# Patient Record
Sex: Male | Born: 1950 | Race: White | Hispanic: No | Marital: Married | Smoking: Former smoker
Health system: Southern US, Community
[De-identification: ages and names within clinical notes are randomized; demographics above are authoritative.]

## PROBLEM LIST (undated history)

## (undated) DIAGNOSIS — I10 Essential (primary) hypertension: Secondary | ICD-10-CM

## (undated) DIAGNOSIS — E079 Disorder of thyroid, unspecified: Secondary | ICD-10-CM

## (undated) DIAGNOSIS — E7800 Pure hypercholesterolemia, unspecified: Secondary | ICD-10-CM

## (undated) DIAGNOSIS — R011 Cardiac murmur, unspecified: Secondary | ICD-10-CM

## (undated) DIAGNOSIS — F1910 Other psychoactive substance abuse, uncomplicated: Secondary | ICD-10-CM

## (undated) DIAGNOSIS — H409 Unspecified glaucoma: Secondary | ICD-10-CM

## (undated) DIAGNOSIS — H269 Unspecified cataract: Secondary | ICD-10-CM

## (undated) HISTORY — PX: APPENDECTOMY: SHX54

## (undated) HISTORY — DX: Unspecified cataract: H26.9

## (undated) HISTORY — PX: TONSILLECTOMY: SUR1361

## (undated) HISTORY — DX: Disorder of thyroid, unspecified: E07.9

## (undated) HISTORY — PX: COLONOSCOPY W/ POLYPECTOMY: SHX1380

## (undated) HISTORY — DX: Other psychoactive substance abuse, uncomplicated: F19.10

## (undated) HISTORY — DX: Unspecified glaucoma: H40.9

## (undated) HISTORY — PX: EYE SURGERY: SHX253

---

## 2011-12-27 ENCOUNTER — Inpatient Hospital Stay (HOSPITAL_COMMUNITY)
Admission: EM | Admit: 2011-12-27 | Discharge: 2012-01-06 | DRG: 750 | Disposition: A | Discharge status: Rehabilitation Facility | Payer: BC Managed Care – PPO | Attending: Internal Medicine | Admitting: Internal Medicine

## 2011-12-27 ENCOUNTER — Encounter (HOSPITAL_COMMUNITY): Payer: Self-pay

## 2011-12-27 ENCOUNTER — Emergency Department (HOSPITAL_COMMUNITY): Payer: BC Managed Care – PPO

## 2011-12-27 DIAGNOSIS — F10931 Alcohol use, unspecified with withdrawal delirium: Principal | ICD-10-CM | POA: Diagnosis present

## 2011-12-27 DIAGNOSIS — I10 Essential (primary) hypertension: Secondary | ICD-10-CM | POA: Diagnosis present

## 2011-12-27 DIAGNOSIS — F10939 Alcohol use, unspecified with withdrawal, unspecified: Secondary | ICD-10-CM | POA: Diagnosis present

## 2011-12-27 DIAGNOSIS — R7989 Other specified abnormal findings of blood chemistry: Secondary | ICD-10-CM | POA: Diagnosis present

## 2011-12-27 DIAGNOSIS — F10239 Alcohol dependence with withdrawal, unspecified: Secondary | ICD-10-CM

## 2011-12-27 DIAGNOSIS — F10231 Alcohol dependence with withdrawal delirium: Secondary | ICD-10-CM | POA: Diagnosis present

## 2011-12-27 DIAGNOSIS — R000 Tachycardia, unspecified: Secondary | ICD-10-CM | POA: Diagnosis present

## 2011-12-27 DIAGNOSIS — E871 Hypo-osmolality and hyponatremia: Secondary | ICD-10-CM | POA: Diagnosis present

## 2011-12-27 DIAGNOSIS — E876 Hypokalemia: Secondary | ICD-10-CM | POA: Diagnosis present

## 2011-12-27 DIAGNOSIS — E872 Acidosis, unspecified: Secondary | ICD-10-CM | POA: Diagnosis present

## 2011-12-27 DIAGNOSIS — D649 Anemia, unspecified: Secondary | ICD-10-CM | POA: Diagnosis present

## 2011-12-27 DIAGNOSIS — K7010 Alcoholic hepatitis without ascites: Secondary | ICD-10-CM | POA: Diagnosis present

## 2011-12-27 DIAGNOSIS — R569 Unspecified convulsions: Secondary | ICD-10-CM | POA: Diagnosis present

## 2011-12-27 DIAGNOSIS — F1020 Alcohol dependence, uncomplicated: Secondary | ICD-10-CM | POA: Diagnosis present

## 2011-12-27 DIAGNOSIS — F411 Generalized anxiety disorder: Secondary | ICD-10-CM | POA: Diagnosis present

## 2011-12-27 DIAGNOSIS — F1010 Alcohol abuse, uncomplicated: Secondary | ICD-10-CM | POA: Diagnosis present

## 2011-12-27 HISTORY — DX: Essential (primary) hypertension: I10

## 2011-12-27 LAB — RAPID URINE DRUG SCREEN, HOSP PERFORMED
Barbiturates: NOT DETECTED
Benzodiazepines: NOT DETECTED

## 2011-12-27 LAB — CBC
HCT: 40.6 % (ref 39.0–52.0)
HCT: 35.9 % — ABNORMAL LOW (ref 39.0–52.0)
MCV: 91.1 fL (ref 78.0–100.0)
Platelets: 168 10*3/uL (ref 150–400)
RBC: 4.32 MIL/uL (ref 4.22–5.81)
RBC: 3.94 MIL/uL — ABNORMAL LOW (ref 4.22–5.81)
RDW: 13.7 % (ref 11.5–15.5)
WBC: 11.5 10*3/uL — ABNORMAL HIGH (ref 4.0–10.5)
WBC: 8.7 10*3/uL (ref 4.0–10.5)

## 2011-12-27 LAB — URINALYSIS, ROUTINE W REFLEX MICROSCOPIC
Leukocytes, UA: NEGATIVE
Nitrite: NEGATIVE
Specific Gravity, Urine: 1.016 (ref 1.005–1.030)
Urobilinogen, UA: 1 mg/dL (ref 0.0–1.0)

## 2011-12-27 LAB — CREATININE, SERUM
GFR calc Af Amer: >90 mL/min (ref >90)
GFR calc non Af Amer: >90 mL/min (ref >90)

## 2011-12-27 LAB — PROTIME-INR: INR: 0.98 (ref 0.00–1.49)

## 2011-12-27 LAB — LACTIC ACID, PLASMA: Lactic Acid, Venous: 2.4 mmol/L — ABNORMAL HIGH (ref 0.5–2.2)

## 2011-12-27 LAB — COMPREHENSIVE METABOLIC PANEL
AST: 180 U/L — ABNORMAL HIGH (ref 0–37)
Albumin: 5 g/dL (ref 3.5–5.2)
Alkaline Phosphatase: 112 U/L (ref 39–117)
BUN: 6 mg/dL (ref 6–23)
CO2: 13 mEq/L — ABNORMAL LOW (ref 19–32)
Chloride: 88 mEq/L — ABNORMAL LOW (ref 96–112)
Potassium: 3.2 mEq/L — ABNORMAL LOW (ref 3.5–5.1)
Total Bilirubin: 1.3 mg/dL — ABNORMAL HIGH (ref 0.3–1.2)

## 2011-12-27 LAB — AMMONIA: Ammonia: 45 umol/L (ref 11–60)

## 2011-12-27 LAB — APTT: aPTT: 30 seconds (ref 24–37)

## 2011-12-27 LAB — ETHANOL: Alcohol, Ethyl (B): <11 mg/dL (ref 0–11)

## 2011-12-27 LAB — POCT I-STAT TROPONIN I: Troponin i, poc: 0 ng/mL (ref 0.00–0.08)

## 2011-12-27 MED ORDER — FOLIC ACID 1 MG PO TABS
1.0000 mg | ORAL_TABLET | Freq: Once | ORAL | Status: AC
  Administered 2011-12-27: 1 mg via ORAL
  Filled 2011-12-27: qty 1

## 2011-12-27 MED ORDER — LORAZEPAM 1 MG PO TABS
1.0000 mg | ORAL_TABLET | Freq: Four times a day (QID) | ORAL | Status: DC | PRN
  Administered 2011-12-27 – 2011-12-28 (×2): 1 mg via ORAL
  Filled 2011-12-27 (×2): qty 1

## 2011-12-27 MED ORDER — THIAMINE HCL 100 MG/ML IJ SOLN
100.0000 mg | Freq: Every day | INTRAMUSCULAR | Status: DC
  Filled 2011-12-27 (×5): qty 1

## 2011-12-27 MED ORDER — SODIUM CHLORIDE 0.9 % IJ SOLN
3.0000 mL | Freq: Two times a day (BID) | INTRAMUSCULAR | Status: DC
  Administered 2011-12-27 – 2012-01-05 (×11): 3 mL via INTRAVENOUS

## 2011-12-27 MED ORDER — LORAZEPAM 2 MG/ML IJ SOLN
1.0000 mg | Freq: Four times a day (QID) | INTRAMUSCULAR | Status: DC | PRN
  Administered 2011-12-29: 1 mg via INTRAVENOUS
  Filled 2011-12-27 (×3): qty 1

## 2011-12-27 MED ORDER — MAGNESIUM SULFATE 40 MG/ML IJ SOLN
2.0000 g | Freq: Once | INTRAMUSCULAR | Status: AC
  Administered 2011-12-27: 2 g via INTRAVENOUS
  Filled 2011-12-27: qty 50

## 2011-12-27 MED ORDER — ENOXAPARIN SODIUM 40 MG/0.4ML ~~LOC~~ SOLN
40.0000 mg | SUBCUTANEOUS | Status: DC
  Administered 2011-12-27 – 2012-01-03 (×8): 40 mg via SUBCUTANEOUS
  Filled 2011-12-27 (×9): qty 0.4

## 2011-12-27 MED ORDER — FOLIC ACID 1 MG PO TABS
1.0000 mg | ORAL_TABLET | Freq: Every day | ORAL | Status: DC
  Administered 2011-12-28 – 2012-01-06 (×10): 1 mg via ORAL
  Filled 2011-12-27 (×10): qty 1

## 2011-12-27 MED ORDER — LORAZEPAM 2 MG/ML IJ SOLN
1.0000 mg | Freq: Once | INTRAMUSCULAR | Status: AC
  Administered 2011-12-27: 1 mg via INTRAVENOUS
  Filled 2011-12-27: qty 1

## 2011-12-27 MED ORDER — ADULT MULTIVITAMIN W/MINERALS CH
1.0000 | ORAL_TABLET | Freq: Every day | ORAL | Status: DC
  Administered 2011-12-27 – 2012-01-06 (×11): 1 via ORAL
  Filled 2011-12-27 (×11): qty 1

## 2011-12-27 MED ORDER — SODIUM CHLORIDE 0.9 % IV BOLUS (SEPSIS)
1000.0000 mL | Freq: Once | INTRAVENOUS | Status: AC
  Administered 2011-12-27: 1000 mL via INTRAVENOUS

## 2011-12-27 MED ORDER — ACETAMINOPHEN 650 MG RE SUPP: 650.0000 mg | Freq: Four times a day (QID) | RECTAL | Status: DC | PRN

## 2011-12-27 MED ORDER — SODIUM CHLORIDE 0.9 % IV SOLN
Freq: Once | INTRAVENOUS | Status: AC
  Administered 2011-12-27: 18:00:00 via INTRAVENOUS

## 2011-12-27 MED ORDER — VITAMIN B-1 100 MG PO TABS
100.0000 mg | ORAL_TABLET | Freq: Every day | ORAL | Status: DC
  Administered 2011-12-28 – 2012-01-02 (×6): 100 mg via ORAL
  Filled 2011-12-27 (×6): qty 1

## 2011-12-27 MED ORDER — LABETALOL HCL 5 MG/ML IV SOLN
10.0000 mg | Freq: Once | INTRAVENOUS | Status: AC
  Administered 2011-12-27: 10 mg via INTRAVENOUS
  Filled 2011-12-27: qty 4

## 2011-12-27 MED ORDER — VITAMIN B-1 100 MG PO TABS
100.0000 mg | ORAL_TABLET | Freq: Once | ORAL | Status: AC
  Administered 2011-12-27: 100 mg via ORAL
  Filled 2011-12-27: qty 1

## 2011-12-27 MED ORDER — LORAZEPAM 2 MG/ML IJ SOLN
1.0000 mg | Freq: Once | INTRAMUSCULAR | Status: AC
  Administered 2011-12-27: 1 mg via INTRAVENOUS

## 2011-12-27 MED ORDER — THIAMINE HCL 100 MG/ML IJ SOLN
Freq: Once | INTRAVENOUS | Status: AC
  Administered 2011-12-27: 21:00:00 via INTRAVENOUS
  Filled 2011-12-27 (×2): qty 1000

## 2011-12-27 MED ORDER — LORAZEPAM 2 MG/ML IJ SOLN
INTRAMUSCULAR | Status: AC
  Filled 2011-12-27: qty 1

## 2011-12-27 MED ORDER — POTASSIUM CHLORIDE CRYS ER 20 MEQ PO TBCR
40.0000 meq | EXTENDED_RELEASE_TABLET | Freq: Once | ORAL | Status: AC
  Administered 2011-12-27: 40 meq via ORAL
  Filled 2011-12-27: qty 2

## 2011-12-27 MED ORDER — ONDANSETRON HCL 4 MG/2ML IJ SOLN: 4.0000 mg | Freq: Four times a day (QID) | INTRAMUSCULAR | Status: DC | PRN

## 2011-12-27 MED ORDER — ONDANSETRON HCL 4 MG PO TABS: 4.0000 mg | ORAL_TABLET | Freq: Four times a day (QID) | ORAL | Status: DC | PRN

## 2011-12-27 MED ORDER — ACETAMINOPHEN 325 MG PO TABS: 650.0000 mg | ORAL_TABLET | Freq: Four times a day (QID) | ORAL | Status: DC | PRN

## 2011-12-27 NOTE — ED Notes (Signed)
Report given to [place], rn.  Pt transported via stretcher to floor.

## 2011-12-27 NOTE — ED Notes (Signed)
Pt back from CT

## 2011-12-27 NOTE — ED Notes (Signed)
Per [facility], today at 1400 pt started having an episode of slurred speech, not making any sense per wife, and was shaking all over. Had been working on a cable box prior to episode which lasted 1 minute. Pt is now A/O x 4. 20g to LH, 230/130 pressure initially by EMS, then 160/100, CBG 109. ST on the monitor.

## 2011-12-27 NOTE — Progress Notes (Signed)
Antonio Acosta [id number] Admitted to 5508: 12/27/2011 08:30PM Attending Provider: Zannie Cove, MD    Antonio Acosta is a 61 y.o. male patient admitted from ED awake, alert  & orientated  X 3,  full code, VSS - Blood pressure 170/95, pulse 100, temperature 99.1 F (37.3 C), temperature source Oral, resp. rate 20, height 6' (1.829 m), weight 72.1 kg (158 lb 15.2 oz), SpO2 99.00%. RA, no c/o shortness of breath, no c/o chest pain, no distress noted. Tele #[id number] placed and pt is currently running:normal sinus rhythm.   IV site WDL:  forearm right, condition patent and no redness with a transparent dsg that's clean dry and intact.  Allergies:  No Known Allergies   Past Medical History  Diagnosis Date  . Hypertension     History:  obtained from the patient.  Pt orientation to unit, room and routine. Information packet given to patient/family and safety video watched.  Admission INP armband ID verified with patient/family, and in place. SR up x 2, fall risk assessment complete with Patient and family verbalizing understanding of risks associated with falls. Pt verbalizes an understanding of how to use the call bell and to call for help before getting out of bed.  Skin, clean-dry- intact without evidence of bruising, or skin tears.   No evidence of skin break down noted on exam.    Will cont to monitor and assist as needed.  Julien Nordmann [facility], RN 12/27/2011 11:01 PM

## 2011-12-27 NOTE — ED Notes (Signed)
Patient transported to CT 

## 2011-12-27 NOTE — H&P (Addendum)
[facility]          History and Physical    PCP:  No primary provider on file.   Chief Complaint:  Shakes, tremors, slurred speech  HPI: Mr. Antonio Acosta is a 61 year old white male, he has not seen a medical doctor or 5-6 years, his wife relates patient has been under a lot of stress for over 6 months now, related to the demise of his mother and job loss , he apparently has been drinking very heavily for the last 6 months, patient thinks it's more like 9 months . He admits drinking about 5-10 beers a day , yesterday he drank only one or 2 beers during the day , and this morning he went out with his wife who works with a gardening club and they were outside raking leaves and doing other work in the garden, she drove them back home and this afternoon around 1pm he seemed a little more anxious and started saying things that did not make sense, was tremulous and his wife noted that her speech was slightly slurred as well. No history of fevers or chills, no weakness or numbness in any arm or leg, no recent trauma, no new medications. Upon evaluation the emergency room he was noted to be tachycardic, hypertensive per EMS blood pressure of 230/130 initially, tremulous, and at one point in the emergency room room had an episode of seizure-like jerking movements but never quite lost consciousness per family, this was transient and resolved.   Allergies:  No Known Allergies    Past Medical History  Diagnosis Date  . Hypertension     No past surgical history on file.  Prior to Admission medications   Medication Sig Start Date End Date Taking? Authorizing Provider  Tetrahydrozoline HCl (VISINE OP) Place 1 drop into both eyes daily as needed. For dry eyes   Yes Historical Provider, MD    Social History:  does not have a smoking history on file. He does not have any smokeless tobacco history on file. His alcohol and drug histories not on file.  family history: this was reviewed  with patient, no pertinent family history noted, no h/o alcoholism or other liver diseases  Review of Systems:  positives bolded Constitutional: Denies fever, chills, diaphoresis, appetite change and fatigue.  HEENT: Denies photophobia, eye pain, redness, hearing loss, ear pain, congestion, sore throat, rhinorrhea, sneezing, mouth sores, trouble swallowing, neck pain, neck stiffness and tinnitus.   Respiratory: Denies SOB, DOE, cough, chest tightness,  and wheezing.   Cardiovascular: Denies chest pain, palpitations and leg swelling.  Gastrointestinal: Denies nausea, vomiting, abdominal pain, diarrhea, constipation, blood in stool and abdominal distention.  Genitourinary: Denies dysuria, urgency, frequency, hematuria, flank pain and difficulty urinating.  Musculoskeletal: Denies myalgias, back pain, joint swelling, arthralgias and gait problem.  Skin: Denies pallor, rash and wound.  Neurological: Denies dizziness, seizures, syncope, weakness, light-headedness, numbness and headaches.  Hematological: Denies adenopathy. Easy bruising, personal or family bleeding history  Psychiatric/Behavioral: Denies suicidal ideation, mood changes, confusion, nervousness, sleep disturbance and agitation   Physical Exam: Blood pressure 168/98, pulse 133, temperature 98.2 F (36.8 C), temperature source Oral, resp. rate 29, SpO2 97.00%.  Gen.: Alert awake, extremely anxious and flushed appearance HEENT pupils equal, small reactive to light, oral mucosa moist and pink CVS S1-S2 regular rate rhythm no murmurs rubs or gallops Lungs clear auscultation bilaterally Abdomen soft nontender with normal bowel sounds no organomegaly Extremities no edema clubbing or cyanosis Neuro: Tremors noted in both upper  extremities , cranial nerves 2-12 intact   motor 5 x 5 in all extremities proximal and distal muscle groups Sensations light touch intact Deep tendon reflexes 2+ bilaterally Plantars withdrawal Coordination: On  finger-nose test noted to have extreme jerkiness on both sides   gait not assessed  Labs on Admission:  Results for orders placed during the hospital encounter of 12/27/11 (from the past 48 hour(s))  CBC     Status: Abnormal   Collection Time   12/27/11  3:16 PM      Component Value Range Comment   WBC 11.5 (*) 4.0 - 10.5 K/uL    RBC 4.32  4.22 - 5.81 MIL/uL    Hemoglobin 13.4  13.0 - 17.0 g/dL    HCT 95.6  21.3 - 08.6 %    MCV 94.0  78.0 - 100.0 fL    MCH 31.0  26.0 - 34.0 pg    MCHC 33.0  30.0 - 36.0 g/dL    RDW 57.8  46.9 - 62.9 %    Platelets 199  150 - 400 K/uL   COMPREHENSIVE METABOLIC PANEL     Status: Abnormal   Collection Time   12/27/11  3:16 PM      Component Value Range Comment   Sodium 137  135 - 145 mEq/L    Potassium 3.2 (*) 3.5 - 5.1 mEq/L    Chloride 88 (*) 96 - 112 mEq/L    CO2 13 (*) 19 - 32 mEq/L    Glucose, Bld 156 (*) 70 - 99 mg/dL    BUN 6  6 - 23 mg/dL    Creatinine, Ser 5.28  0.50 - 1.35 mg/dL    Calcium 41.3  8.4 - 10.5 mg/dL    Total Protein 9.3 (*) 6.0 - 8.3 g/dL    Albumin 5.0  3.5 - 5.2 g/dL    AST 244 (*) 0 - 37 U/L    ALT 129 (*) 0 - 53 U/L    Alkaline Phosphatase 112  39 - 117 U/L    Total Bilirubin 1.3 (*) 0.3 - 1.2 mg/dL    GFR calc non Af Amer >90  >90 mL/min    GFR calc Af Amer >90  >90 mL/min   ETHANOL     Status: Normal   Collection Time   12/27/11  3:16 PM      Component Value Range Comment   Alcohol, Ethyl (B) <11  0 - 11 mg/dL   PROTIME-INR     Status: Normal   Collection Time   12/27/11  3:16 PM      Component Value Range Comment   Prothrombin Time 12.9  11.6 - 15.2 seconds    INR 0.98  0.00 - 1.49   APTT     Status: Normal   Collection Time   12/27/11  3:16 PM      Component Value Range Comment   aPTT 30  24 - 37 seconds   POCT I-STAT TROPONIN I     Status: Normal   Collection Time   12/27/11  3:51 PM      Component Value Range Comment   Troponin i, poc 0.00  0.00 - 0.08 ng/mL    Comment 3            URINE RAPID  DRUG SCREEN (HOSP PERFORMED)     Status: Normal   Collection Time   12/27/11  4:33 PM      Component Value Range Comment   Opiates NONE DETECTED  NONE DETECTED    Cocaine NONE DETECTED  NONE DETECTED    Benzodiazepines NONE DETECTED  NONE DETECTED    Amphetamines NONE DETECTED  NONE DETECTED    Tetrahydrocannabinol NONE DETECTED  NONE DETECTED    Barbiturates NONE DETECTED  NONE DETECTED   URINALYSIS, ROUTINE W REFLEX MICROSCOPIC     Status: Abnormal   Collection Time   12/27/11  4:33 PM      Component Value Range Comment   Color, Urine YELLOW  YELLOW    APPearance CLEAR  CLEAR    Specific Gravity, Urine 1.016  1.005 - 1.030    pH 7.0  5.0 - 8.0    Glucose, UA 100 (*) NEGATIVE mg/dL    Hgb urine dipstick TRACE (*) NEGATIVE    Bilirubin Urine NEGATIVE  NEGATIVE    Ketones, ur 15 (*) NEGATIVE mg/dL    Protein, ur 30 (*) NEGATIVE mg/dL    Urobilinogen, UA 1.0  0.0 - 1.0 mg/dL    Nitrite NEGATIVE  NEGATIVE    Leukocytes, UA NEGATIVE  NEGATIVE   URINE MICROSCOPIC-ADD ON     Status: Normal   Collection Time   12/27/11  4:33 PM      Component Value Range Comment   Squamous Epithelial / LPF RARE  RARE    WBC, UA 0-2  <3 WBC/hpf    RBC / HPF 0-2  <3 RBC/hpf   MAGNESIUM     Status: Abnormal   Collection Time   12/27/11  4:42 PM      Component Value Range Comment   Magnesium 1.4 (*) 1.5 - 2.5 mg/dL     Radiological Exams on Admission: Ct Head Wo Contrast  12/27/2011  *RADIOLOGY REPORT*  Clinical Data: Transiting episode of slurred speech and tremors.  CT HEAD WITHOUT CONTRAST  Technique:  Contiguous axial images were obtained from the base of the skull through the vertex without contrast.  Comparison: None.  Findings: No acute cortical infarct, hemorrhage, or mass lesion is present.  Mild periventricular and subcortical white matter hypoattenuation is noted.  Mild generalized atrophy is present as well.  The ventricles are of normal size.  No significant extra- axial fluid collection  is present.  There is partial opacification of the anterior left ethmoid air cells.  The paranasal sinuses and mastoid air cells are otherwise clear.  The osseous skull is intact.  IMPRESSION:  1.  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 The finding is nonspecific but can be seen in the setting of chronic microvascular ischemia, a demyelinating process such as multiple sclerosis, vasculitis, complicated migraine headaches, or as the sequelae of a prior infectious or inflammatory process. 2.  Minimal ethmoid sinus disease.   Original Report Authenticated By: Marin Roberts, M.D.     Assessment/Plan  1. Alcohol withdrawal: Physical and autonomic manifestations of alcohol withdrawal, in a heavy alcoholic Alcohol level less than 11 Will  treat with supportive care, IV fluids, Ativan PRN per CIWA  protocol Thiamine, folate, multivitamins Check ammonia level  2. Hypokalemia will replace  3 Hypomagnesemia: We'll replace  4. 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

## 2011-12-27 NOTE — ED Provider Notes (Signed)
History     CSN: [id number]  Arrival date & time 12/27/11  1504   None     Chief Complaint  Patient presents with  . Tremors   HPI chief complaint garbled speech. Patient arrived by EMS. History provided by patient family and EMS. No language barriers identified. Information not limited. Onset of symptoms several hours ago. Location at home. Symptoms resolved spontaneously not worsened by anything. Duration 1 minute. Context the patient had missed all food and drink today. He also been working outside. For associated signs and symptoms please refer to the review of systems. No treatments tried prior to arrival. No recent medical care. Regarding patient's social history please refer to the nurse's notes. Patient initially denied alcohol consumption for many. I have reviewed patient's past medical surgical social history as well as medications and allergies. Positive family history for TIA in the patient's mother.  Past Medical History  Diagnosis Date  . Hypertension     No past surgical history on file.  No family history on file.  History  Substance Use Topics  . Smoking status: Not on file  . Smokeless tobacco: Not on file  . Alcohol Use:       Review of Systems  Constitutional: Negative for fever and chills.  HENT: Negative for hearing loss, ear pain, congestion, sore throat, facial swelling, rhinorrhea, drooling, mouth sores, trouble swallowing, neck pain, neck stiffness, voice change, sinus pressure and ear discharge.   Eyes: Negative for visual disturbance.  Respiratory: Negative for cough, chest tightness and shortness of breath.   Cardiovascular: Negative for chest pain, palpitations and leg swelling.  Gastrointestinal: Negative for nausea, vomiting, abdominal pain, diarrhea, constipation, blood in stool and abdominal distention.  Genitourinary: Negative for dysuria, urgency, hematuria and difficulty urinating.  Musculoskeletal: Negative for back pain and gait problem.   Skin: Negative for rash.  Neurological: Positive for tremors and speech difficulty. Negative for dizziness, seizures, syncope, facial asymmetry, weakness, light-headedness, numbness and headaches.  Hematological: Negative for adenopathy. Does not bruise/bleed easily.  Psychiatric/Behavioral: Negative for confusion.    Allergies  Review of patient's allergies indicates no known allergies.  Home Medications  No current outpatient prescriptions on file.  SpO2 98%  Physical Exam  Constitutional: He is oriented to person, place, and time. He appears well-developed and well-nourished. No distress.  HENT:  Head: Normocephalic.  Eyes: Conjunctivae normal are normal.  Neck: Normal range of motion. Neck supple.  Cardiovascular: Normal rate, regular rhythm, normal heart sounds and intact distal pulses.   No murmur heard. Pulmonary/Chest: Effort normal and breath sounds normal. No respiratory distress.  Abdominal: Soft. Bowel sounds are normal. He exhibits no distension. There is no tenderness.  Musculoskeletal: Normal range of motion. He exhibits no edema and no tenderness.  Neurological: He is alert and oriented to person, place, and time. He has normal strength. He displays tremor. No cranial nerve deficit or sensory deficit. Coordination normal. GCS eye subscore is 4. GCS verbal subscore is 5. GCS motor subscore is 6.  Skin: Skin is warm and dry. He is not diaphoretic.  Psychiatric: He has a normal mood and affect.    ED Course  Procedures (including critical care time)  Labs Reviewed  CBC - Abnormal; Notable for the following:    WBC 11.5 (*)     All other components within normal limits  COMPREHENSIVE METABOLIC PANEL - Abnormal; Notable for the following:    Potassium 3.2 (*)     Chloride 88 (*)  CO2 13 (*)     Glucose, Bld 156 (*)     Total Protein 9.3 (*)     AST 180 (*)     ALT 129 (*)     Total Bilirubin 1.3 (*)     All other components within normal limits   URINALYSIS, ROUTINE W REFLEX MICROSCOPIC - Abnormal; Notable for the following:    Glucose, UA 100 (*)     Hgb urine dipstick TRACE (*)     Ketones, ur 15 (*)     Protein, ur 30 (*)     All other components within normal limits  MAGNESIUM - Abnormal; Notable for the following:    Magnesium 1.4 (*)     All other components within normal limits  URINE RAPID DRUG SCREEN (HOSP PERFORMED)  ETHANOL  PROTIME-INR  APTT  POCT I-STAT TROPONIN I  URINE MICROSCOPIC-ADD ON  HEMOGLOBIN A1C  AMMONIA  SALICYLATE LEVEL  LACTIC ACID, PLASMA   Ct Head Wo Contrast  12/27/2011  *RADIOLOGY REPORT*  Clinical Data: Transiting episode of slurred speech and tremors.  CT HEAD WITHOUT CONTRAST  Technique:  Contiguous axial images were obtained from the base of the skull through the vertex without contrast.  Comparison: None.  Findings: No acute cortical infarct, hemorrhage, or mass lesion is present.  Mild periventricular and subcortical white matter hypoattenuation is noted.  Mild generalized atrophy is present as well.  The ventricles are of normal size.  No significant extra- axial fluid collection is present.  There is partial opacification of the anterior left ethmoid air cells.  The paranasal sinuses and mastoid air cells are otherwise clear.  The osseous skull is intact.  IMPRESSION:  1.  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 The finding is nonspecific but can be seen in the setting of chronic microvascular ischemia, a demyelinating process such as multiple sclerosis, vasculitis, complicated migraine headaches, or as the sequelae of a prior infectious or inflammatory process. 2.  Minimal ethmoid sinus disease.   Original Report Authenticated By: Marin Roberts, M.D.      1. Alcohol withdrawal   2. ETOH abuse   3. Hypokalemia   4. LFT elevation   5. Metabolic acidosis       MDM  Patient is a tremulous and anxious appearing but otherwise well-appearing nail with no prior  stroke history presents to ED after 1 minute episode of garbled speech per family member. No reported facial droop paresthesias or weakness. No recent trauma. No concern for meningitis or encephalitis on exam. Patient appears very anxious and had a two-minute episode of generalized tonoclonic activity all the while the patient was conscious and alert and following commands during this episode. Likely pseudoseizure. Head CT unremarkable. Labs unremarkable. Patient is not hypoglycemic. Vital stable afebrile. No focal deficits on exam. Although family and patient denies history of alcohol use to me the family did endorse to the admitting hospitalist the patient drinks almost a 12 pack daily but has not had any alcohol today. Tremors tachycardia mild hypertension likely secondary to withdrawal. More Ativan provided. Patient comfortable at this time. CIWA protocol in place. Admitted to hospital service.        Consuello Masse, MD 12/28/11 [phone]

## 2011-12-27 NOTE — ED Notes (Signed)
hospitalist at the bedside 

## 2011-12-28 LAB — COMPREHENSIVE METABOLIC PANEL
ALT: 95 U/L — ABNORMAL HIGH (ref 0–53)
AST: 128 U/L — ABNORMAL HIGH (ref 0–37)
Albumin: 4 g/dL (ref 3.5–5.2)
Alkaline Phosphatase: 92 U/L (ref 39–117)
Calcium: 9.2 mg/dL (ref 8.4–10.5)
GFR calc Af Amer: >90 mL/min (ref >90)
Glucose, Bld: 112 mg/dL — ABNORMAL HIGH (ref 70–99)
Potassium: 3.5 mEq/L (ref 3.5–5.1)
Sodium: 132 mEq/L — ABNORMAL LOW (ref 135–145)
Total Protein: 8 g/dL (ref 6.0–8.3)

## 2011-12-28 LAB — CBC
MCH: 30.7 pg (ref 26.0–34.0)
MCHC: 33.4 g/dL (ref 30.0–36.0)
Platelets: 154 10*3/uL (ref 150–400)
RDW: 13.7 % (ref 11.5–15.5)

## 2011-12-28 MED ORDER — SODIUM CHLORIDE 0.9 % IV SOLN
INTRAVENOUS | Status: DC
  Administered 2011-12-28: 23:00:00 via INTRAVENOUS
  Administered 2011-12-28: 1000 mL via INTRAVENOUS

## 2011-12-28 MED ORDER — LORAZEPAM 2 MG/ML IJ SOLN
1.0000 mg | Freq: Once | INTRAMUSCULAR | Status: AC
  Administered 2011-12-29: 1 mg via INTRAVENOUS
  Filled 2011-12-28 (×2): qty 1

## 2011-12-28 MED ORDER — ZOLPIDEM TARTRATE 5 MG PO TABS
5.0000 mg | ORAL_TABLET | Freq: Every evening | ORAL | Status: DC | PRN
  Administered 2011-12-28: 5 mg via ORAL
  Filled 2011-12-28: qty 1

## 2011-12-28 NOTE — Progress Notes (Signed)
Pt bp 169/107 HR 108 MD notified.

## 2011-12-29 DIAGNOSIS — F10931 Alcohol use, unspecified with withdrawal delirium: Principal | ICD-10-CM

## 2011-12-29 DIAGNOSIS — E876 Hypokalemia: Secondary | ICD-10-CM

## 2011-12-29 DIAGNOSIS — F10939 Alcohol use, unspecified with withdrawal, unspecified: Secondary | ICD-10-CM

## 2011-12-29 DIAGNOSIS — F10239 Alcohol dependence with withdrawal, unspecified: Secondary | ICD-10-CM

## 2011-12-29 DIAGNOSIS — F10231 Alcohol dependence with withdrawal delirium: Principal | ICD-10-CM

## 2011-12-29 LAB — COMPREHENSIVE METABOLIC PANEL
ALT: 91 U/L — ABNORMAL HIGH (ref 0–53)
Alkaline Phosphatase: 90 U/L (ref 39–117)
Chloride: 95 mEq/L — ABNORMAL LOW (ref 96–112)
GFR calc Af Amer: >90 mL/min (ref >90)
Glucose, Bld: 94 mg/dL (ref 70–99)
Potassium: 3.3 mEq/L — ABNORMAL LOW (ref 3.5–5.1)
Sodium: 134 mEq/L — ABNORMAL LOW (ref 135–145)
Total Bilirubin: 2.4 mg/dL — ABNORMAL HIGH (ref 0.3–1.2)
Total Protein: 8 g/dL (ref 6.0–8.3)

## 2011-12-29 LAB — BASIC METABOLIC PANEL
BUN: 11 mg/dL (ref 6–23)
Calcium: 10.1 mg/dL (ref 8.4–10.5)
Chloride: 95 mEq/L — ABNORMAL LOW (ref 96–112)
Creatinine, Ser: 0.62 mg/dL (ref 0.50–1.35)
GFR calc Af Amer: >90 mL/min (ref >90)
GFR calc non Af Amer: >90 mL/min (ref >90)

## 2011-12-29 LAB — MAGNESIUM: Magnesium: 1.8 mg/dL (ref 1.5–2.5)

## 2011-12-29 LAB — GLUCOSE, CAPILLARY: Glucose-Capillary: 101 mg/dL — ABNORMAL HIGH (ref 70–99)

## 2011-12-29 MED ORDER — LORAZEPAM 2 MG/ML IJ SOLN
2.0000 mg | Freq: Once | INTRAMUSCULAR | Status: AC
  Administered 2011-12-29: 2 mg via INTRAVENOUS

## 2011-12-29 MED ORDER — LORAZEPAM 2 MG/ML IJ SOLN
2.0000 mg | INTRAMUSCULAR | Status: DC | PRN
  Administered 2011-12-29: 2 mg via INTRAVENOUS
  Filled 2011-12-29 (×2): qty 1

## 2011-12-29 MED ORDER — LORAZEPAM 1 MG PO TABS: 2.0000 mg | ORAL_TABLET | ORAL | Status: DC | PRN

## 2011-12-29 MED ORDER — LORAZEPAM 2 MG/ML IJ SOLN
1.0000 mg | Freq: Once | INTRAMUSCULAR | Status: AC
  Administered 2011-12-29: 1 mg via INTRAVENOUS

## 2011-12-29 MED ORDER — LORAZEPAM 2 MG/ML IJ SOLN
2.0000 mg | INTRAMUSCULAR | Status: DC | PRN
  Administered 2011-12-29: 2 mg via INTRAVENOUS
  Filled 2011-12-29: qty 1

## 2011-12-29 MED ORDER — LORAZEPAM 2 MG/ML IJ SOLN
INTRAMUSCULAR | Status: AC
  Filled 2011-12-29: qty 1

## 2011-12-29 MED ORDER — LORAZEPAM 2 MG/ML IJ SOLN
1.0000 mg | INTRAMUSCULAR | Status: DC | PRN
  Administered 2011-12-29: 2 mg via INTRAVENOUS

## 2011-12-29 MED ORDER — KCL IN DEXTROSE-NACL 20-5-0.9 MEQ/L-%-% IV SOLN
INTRAVENOUS | Status: DC
  Administered 2011-12-29 – 2011-12-30 (×2): via INTRAVENOUS
  Filled 2011-12-29 (×3): qty 1000

## 2011-12-29 MED ORDER — BIOTENE DRY MOUTH MT LIQD
15.0000 mL | Freq: Two times a day (BID) | OROMUCOSAL | Status: DC
  Administered 2011-12-29 – 2011-12-31 (×5): 15 mL via OROMUCOSAL

## 2011-12-29 MED ORDER — LORAZEPAM 2 MG/ML IJ SOLN
2.0000 mg | INTRAMUSCULAR | Status: DC | PRN
  Administered 2011-12-29 (×2): 2 mg via INTRAVENOUS
  Filled 2011-12-29 (×4): qty 1

## 2011-12-29 MED ORDER — SODIUM CHLORIDE 0.9 % IJ SOLN
INTRAMUSCULAR | Status: AC
  Filled 2011-12-29: qty 10

## 2011-12-29 MED ORDER — DEXMEDETOMIDINE HCL IN NACL 200 MCG/50ML IV SOLN
0.2000 ug/kg/h | INTRAVENOUS | Status: DC
  Administered 2011-12-29: 0.6 ug/kg/h via INTRAVENOUS
  Administered 2011-12-29: 0.2 ug/kg/h via INTRAVENOUS
  Administered 2011-12-30: 0.7 ug/kg/h via INTRAVENOUS
  Administered 2011-12-30: 0.6 ug/kg/h via INTRAVENOUS
  Administered 2011-12-30 – 2011-12-31 (×3): 0.4 ug/kg/h via INTRAVENOUS
  Filled 2011-12-29 (×5): qty 50
  Filled 2011-12-29: qty 100

## 2011-12-29 MED ORDER — PROPRANOLOL HCL 20 MG PO TABS
20.0000 mg | ORAL_TABLET | Freq: Two times a day (BID) | ORAL | Status: DC
  Administered 2011-12-29 (×2): 20 mg via ORAL
  Filled 2011-12-29 (×4): qty 1

## 2011-12-29 NOTE — Progress Notes (Signed)
MD at bedside, patient CIWA score increase to 20, plan to transfer to ICU and another 2 mg IV ativan order now. Will ciontinue to monitor and transfer patient.

## 2011-12-29 NOTE — Progress Notes (Addendum)
[facility]             Progress Note   Subjective: Worsening DTs overnight, anxious, tremulous, tachycardic and hypertensive  Objective: Vital signs in last 24 hours: Temp:  [98 F (36.7 C)-98.8 F (37.1 C)] 98.3 F (36.8 C) (12/22 0415) Pulse Rate:  [97-158] 114  (12/22 0700) Resp:  [18-24] 21  (12/22 0700) BP: (123-202)/(74-135) 145/96 mmHg (12/22 0700) SpO2:  [95 %-99 %] 97 % (12/22 0700) Weight change:  Last BM Date: 12/28/11  Intake/Output from previous day: 12/21 0701 - 12/22 0700 In: 931.3 [P.O.:240; I.V.:691.3] Out: -      Physical Exam: General: sleeping now after extra ativan. HEENT: No bruits, no goiter. Heart: Regular rate and rhythm, without murmurs, rubs, gallops, tachycardic. Lungs: Clear to auscultation bilaterally. Abdomen: Soft, nontender, nondistended, positive bowel sounds. Extremities: No clubbing cyanosis or edema with positive pedal pulses. Neuro: Grossly intact, nonfocal, tremulous    Lab Results: Basic Metabolic Panel:  Basename 12/28/11 0650 12/27/11 2103 12/27/11 1642 12/27/11 1516  NA 132* -- -- 137  K 3.5 -- -- 3.2*  CL 94* -- -- 88*  CO2 23 -- -- 13*  GLUCOSE 112* -- -- 156*  BUN 4* -- -- 6  CREATININE 0.51 0.58 -- --  CALCIUM 9.2 -- -- 10.4  MG -- -- 1.4* --  PHOS -- -- -- --   Liver Function Tests:  Basename 12/28/11 0650 12/27/11 1516  AST 128* 180*  ALT 95* 129*  ALKPHOS 92 112  BILITOT 1.7* 1.3*  PROT 8.0 9.3*  ALBUMIN 4.0 5.0   No results found for this basename: LIPASE:2,AMYLASE:2 in the last 72 hours  Basename 12/27/11 1839  AMMONIA 45   CBC:  Basename 12/28/11 0650 12/27/11 2103  WBC 6.8 8.7  NEUTROABS -- --  HGB 11.5* 12.5*  HCT 34.4* 35.9*  MCV 92.0 91.1  PLT 154 168   Cardiac Enzymes: No results found for this basename: CKTOTAL:3,CKMB:3,CKMBINDEX:3,TROPONINI:3 in the last 72 hours BNP: No results found for this basename: PROBNP:3 in the last 72 hours D-Dimer: No results found  for this basename: DDIMER:2 in the last 72 hours CBG: No results found for this basename: GLUCAP:6 in the last 72 hours Hemoglobin A1C:  Basename 12/27/11 1516  HGBA1C 5.5   Fasting Lipid Panel: No results found for this basename: CHOL,HDL,LDLCALC,TRIG,CHOLHDL,LDLDIRECT in the last 72 hours Thyroid Function Tests: No results found for this basename: TSH,T4TOTAL,FREET4,T3FREE,THYROIDAB in the last 72 hours Anemia Panel: No results found for this basename: VITAMINB12,FOLATE,FERRITIN,TIBC,IRON,RETICCTPCT in the last 72 hours Coagulation:  Basename 12/28/11 0650 12/27/11 1516  LABPROT 12.5 12.9  INR 0.94 0.98   Urine Drug Screen: Drugs of Abuse     Component Value Date/Time   LABOPIA NONE DETECTED 12/27/2011 1633   COCAINSCRNUR NONE DETECTED 12/27/2011 1633   LABBENZ NONE DETECTED 12/27/2011 1633   AMPHETMU NONE DETECTED 12/27/2011 1633   THCU NONE DETECTED 12/27/2011 1633   LABBARB NONE DETECTED 12/27/2011 1633    Alcohol Level:  Basename 12/27/11 1516  ETH <11   Urinalysis:  Basename 12/27/11 1633  COLORURINE YELLOW  LABSPEC 1.016  PHURINE 7.0  GLUCOSEU 100*  HGBUR TRACE*  BILIRUBINUR NEGATIVE  KETONESUR 15*  PROTEINUR 30*  UROBILINOGEN 1.0  NITRITE NEGATIVE  LEUKOCYTESUR NEGATIVE    Recent Results (from the past 240 hour(s))  MRSA PCR SCREENING     Status: Normal   Collection Time   12/29/11  5:39 AM      Component Value Range Status Comment  MRSA by PCR NEGATIVE  NEGATIVE Final     Studies/Results: Ct Head Wo Contrast  12/27/2011  *RADIOLOGY REPORT*  Clinical Data: Transiting episode of slurred speech and tremors.  CT HEAD WITHOUT CONTRAST  Technique:  Contiguous axial images were obtained from the base of the skull through the vertex without contrast.  Comparison: None.  Findings: No acute cortical infarct, hemorrhage, or mass lesion is present.  Mild periventricular and subcortical white matter hypoattenuation is noted.  Mild generalized atrophy is  present as well.  The ventricles are of normal size.  No significant extra- axial fluid collection is present.  There is partial opacification of the anterior left ethmoid air cells.  The paranasal sinuses and mastoid air cells are otherwise clear.  The osseous skull is intact.  IMPRESSION:  1.  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 The finding is nonspecific but can be seen in the setting of chronic microvascular ischemia, a demyelinating process such as multiple sclerosis, vasculitis, complicated migraine headaches, or as the sequelae of a prior infectious or inflammatory process. 2.  Minimal ethmoid sinus disease.   Original Report Authenticated By: Marin Roberts, M.D.     Medications: Scheduled Meds:    . antiseptic oral rinse  15 mL Mouth Rinse BID  . enoxaparin (LOVENOX) injection  40 mg Subcutaneous Q24H  . folic acid  1 mg Oral Daily  . LORazepam  1 mg Intravenous Once  . multivitamin with minerals  1 tablet Oral Daily  . propranolol  20 mg Oral BID  . sodium chloride  3 mL Intravenous Q12H  . sodium chloride      . thiamine  100 mg Oral Daily   Or  . thiamine  100 mg Intravenous Daily   Continuous Infusions:    . sodium chloride 75 mL/hr at 12/28/11 2256   PRN Meds:.acetaminophen, acetaminophen, LORazepam, LORazepam, ondansetron (ZOFRAN) IV, ondansetron, zolpidem  Assessment/Plan: 1. Alcohol withdrawal/Delirum Tremens Worsening DTs overnight Alcohol level less than 11  continue supportive care, IV fluids, Ativan PRN per CIWA protocol , will increase dose to 2MG  Q2PRN per CIWA Thiamine, folate, multivitamins   ammonia level normal  2. Hypokalemia  replaced   3 Hypomagnesemia:  Replaced  4. Metabolic acidosis: Likely induced by alcohol,  salicylate level and lactic acid were normal  5 Abnormal LFTs: AST greater than ALT: Likely from alcoholic hepatitis   6 EtOH abuse: Counseled, CSW consult for further counseling and rehab options   7  HTN: History of untreated hypertension for over 5 years, now exacerbated by alcohol withdrawal  Add propranolol, will also help with DTs  FULL CODE Disposition: Keep in SDU, if worsens will need ICU  Time spent coordinating care:   LOS: 2 days   [facility] Pager: [phone] 12/29/2011, 7:25 AM

## 2011-12-29 NOTE — Progress Notes (Signed)
[facility]             Progress Note   Subjective: Feels better, still with shakes, gait improved  Objective: Vital signs in last 24 hours: Temp:  [98 F (36.7 C)-98.8 F (37.1 C)] 98.3 F (36.8 C) (12/22 0415) Pulse Rate:  [97-158] 114  (12/22 0700) Resp:  [18-24] 21  (12/22 0700) BP: (123-202)/(74-135) 145/96 mmHg (12/22 0700) SpO2:  [95 %-99 %] 97 % (12/22 0700) Weight change:  Last BM Date: 12/28/11  Intake/Output from previous day: 12/21 0701 - 12/22 0700 In: 931.3 [P.O.:240; I.V.:691.3] Out: -      Physical Exam: General: Alert, awake, oriented x3, in no acute distress. HEENT: No bruits, no goiter. Heart: Regular rate and rhythm, without murmurs, rubs, gallops. Lungs: Clear to auscultation bilaterally. Abdomen: Soft, nontender, nondistended, positive bowel sounds. Extremities: No clubbing cyanosis or edema with positive pedal pulses. Neuro: Grossly intact, nonfocal, tremulous    Lab Results: Basic Metabolic Panel:  Basename 12/28/11 0650 12/27/11 2103 12/27/11 1642 12/27/11 1516  NA 132* -- -- 137  K 3.5 -- -- 3.2*  CL 94* -- -- 88*  CO2 23 -- -- 13*  GLUCOSE 112* -- -- 156*  BUN 4* -- -- 6  CREATININE 0.51 0.58 -- --  CALCIUM 9.2 -- -- 10.4  MG -- -- 1.4* --  PHOS -- -- -- --   Liver Function Tests:  Basename 12/28/11 0650 12/27/11 1516  AST 128* 180*  ALT 95* 129*  ALKPHOS 92 112  BILITOT 1.7* 1.3*  PROT 8.0 9.3*  ALBUMIN 4.0 5.0   No results found for this basename: LIPASE:2,AMYLASE:2 in the last 72 hours  Basename 12/27/11 1839  AMMONIA 45   CBC:  Basename 12/28/11 0650 12/27/11 2103  WBC 6.8 8.7  NEUTROABS -- --  HGB 11.5* 12.5*  HCT 34.4* 35.9*  MCV 92.0 91.1  PLT 154 168   Cardiac Enzymes: No results found for this basename: CKTOTAL:3,CKMB:3,CKMBINDEX:3,TROPONINI:3 in the last 72 hours BNP: No results found for this basename: PROBNP:3 in the last 72 hours D-Dimer: No results found for this basename:  DDIMER:2 in the last 72 hours CBG: No results found for this basename: GLUCAP:6 in the last 72 hours Hemoglobin A1C:  Basename 12/27/11 1516  HGBA1C 5.5   Fasting Lipid Panel: No results found for this basename: CHOL,HDL,LDLCALC,TRIG,CHOLHDL,LDLDIRECT in the last 72 hours Thyroid Function Tests: No results found for this basename: TSH,T4TOTAL,FREET4,T3FREE,THYROIDAB in the last 72 hours Anemia Panel: No results found for this basename: VITAMINB12,FOLATE,FERRITIN,TIBC,IRON,RETICCTPCT in the last 72 hours Coagulation:  Basename 12/28/11 0650 12/27/11 1516  LABPROT 12.5 12.9  INR 0.94 0.98   Urine Drug Screen: Drugs of Abuse     Component Value Date/Time   LABOPIA NONE DETECTED 12/27/2011 1633   COCAINSCRNUR NONE DETECTED 12/27/2011 1633   LABBENZ NONE DETECTED 12/27/2011 1633   AMPHETMU NONE DETECTED 12/27/2011 1633   THCU NONE DETECTED 12/27/2011 1633   LABBARB NONE DETECTED 12/27/2011 1633    Alcohol Level:  Basename 12/27/11 1516  ETH <11   Urinalysis:  Basename 12/27/11 1633  COLORURINE YELLOW  LABSPEC 1.016  PHURINE 7.0  GLUCOSEU 100*  HGBUR TRACE*  BILIRUBINUR NEGATIVE  KETONESUR 15*  PROTEINUR 30*  UROBILINOGEN 1.0  NITRITE NEGATIVE  LEUKOCYTESUR NEGATIVE    Recent Results (from the past 240 hour(s))  MRSA PCR SCREENING     Status: Normal   Collection Time   12/29/11  5:39 AM      Component Value Range Status  Comment   MRSA by PCR NEGATIVE  NEGATIVE Final     Studies/Results: Ct Head Wo Contrast  12/27/2011  *RADIOLOGY REPORT*  Clinical Data: Transiting episode of slurred speech and tremors.  CT HEAD WITHOUT CONTRAST  Technique:  Contiguous axial images were obtained from the base of the skull through the vertex without contrast.  Comparison: None.  Findings: No acute cortical infarct, hemorrhage, or mass lesion is present.  Mild periventricular and subcortical white matter hypoattenuation is noted.  Mild generalized atrophy is present as well.  The  ventricles are of normal size.  No significant extra- axial fluid collection is present.  There is partial opacification of the anterior left ethmoid air cells.  The paranasal sinuses and mastoid air cells are otherwise clear.  The osseous skull is intact.  IMPRESSION:  1.  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 The finding is nonspecific but can be seen in the setting of chronic microvascular ischemia, a demyelinating process such as multiple sclerosis, vasculitis, complicated migraine headaches, or as the sequelae of a prior infectious or inflammatory process. 2.  Minimal ethmoid sinus disease.   Original Report Authenticated By: Marin Roberts, M.D.     Medications: Scheduled Meds:   . antiseptic oral rinse  15 mL Mouth Rinse BID  . enoxaparin (LOVENOX) injection  40 mg Subcutaneous Q24H  . folic acid  1 mg Oral Daily  . LORazepam  1 mg Intravenous Once  . multivitamin with minerals  1 tablet Oral Daily  . sodium chloride  3 mL Intravenous Q12H  . sodium chloride      . thiamine  100 mg Oral Daily   Or  . thiamine  100 mg Intravenous Daily   Continuous Infusions:   . sodium chloride 75 mL/hr at 12/28/11 2256   PRN Meds:.acetaminophen, acetaminophen, LORazepam, LORazepam, ondansetron (ZOFRAN) IV, ondansetron, zolpidem  Assessment/Plan: 1. Alcohol withdrawal:  Physical and autonomic manifestations of alcohol withdrawal, in a heavy alcoholic  Alcohol level less than 11  continue supportive care, IV fluids, Ativan PRN per CIWA protocol  Thiamine, folate, multivitamins  Check ammonia level   2. Hypokalemia  replaced   3 Hypomagnesemia:  Replaced  4. Metabolic acidosis: Likely induced by alcohol,  salicylate level and lactic acid were normal  5 Abnormal LFTs: AST greater than ALT: Likely from alcoholic hepatitis   6 EtOH abuse: Counseled, CSW consult for further counseling and rehab options   7 HTN: History of untreated hypertension for over 5  years, now exacerbated by alcohol withdrawal  Will add low-dose amlodipine, will need further titration    Time spent coordinating care:   LOS: 2 days   [facility] Pager: [phone] 12/29/2011, 7:18 AM

## 2011-12-29 NOTE — Progress Notes (Signed)
eLink Physician-Brief Progress Note Patient Name: Antonio Acosta DOB: [birth year] MRN: [id number]  Date of Service  12/29/2011   HPI/Events of Note   ETOH withdrawal, high ativan requirements, may have had a brief seizure  eICU Interventions  Transfer to ICU Precedex gtt   Intervention Category Major Interventions: Delirium, psychosis, severe agitation - evaluation and management  Keiry Kowal V. 12/29/2011, 4:43 PM

## 2011-12-29 NOTE — Progress Notes (Signed)
Patient had what appeared to be a seizure around 1415, witnessed by 2 RN, gave pt 2 mg IV ativan. Patients CIWA score increasing to 18, showing signs of increased tremors, aggitations, and sweats, patient only alert to self at times. Called and spoke to Dr. Jomarie Longs, about patients seizure and increase CIWA score. She was going to contact CCM about possible higher level of care for this patient.Current HR 106, BP 128/89, will continue to monitor patient.

## 2011-12-29 NOTE — Progress Notes (Signed)
Report called to receiving nurse Darla on 3300. Pt transferred to 3302 by Rapid response nurse, charge nurse and T. Claiborne Billings, NP. Pt's son called to notify, no answer, however pt's wife was calling the unit at the same time. She was updates on how patient did throughout the night and pts transfer . Julien Nordmann [facility]

## 2011-12-29 NOTE — Progress Notes (Signed)
Patient transferring to 2300 per MD order, Report called to [place], RN,

## 2011-12-29 NOTE — Progress Notes (Signed)
Addendum: Called by RN, pt had a brief seizure witnessed by RN lasting <30sec Awake, agitated, confused, tachycardic, uncontrolled BP Given extra 2 mg ativan, has been getting ativan 2mg  Q2H PRN for high CIWA scores Pt still extremely agitated, tachycardic despite getting ativan Q2H, will give extra 2 mg ativan x1 now I think he needs higher level of care and may need Ativan drip or Precedex gtt Requested ICU bed, Called and D/W Dr.ALva (PCCM) and requested critical care eval  Zannie Cove, MD [phone]

## 2011-12-29 NOTE — Progress Notes (Addendum)
Pt having some confusion night, pt and wife requested sleep aid, ambien given at 2157. One hour later pt remained awake and confusion continued, pt climbing out of bed stating "I'm not staying here I'm going home." Pt's wife and son notified via telephone and patient receptive to redirection. Redirection, diversional  activies and 1:1 care only effective short term. Ativan 1mg  IV given at 0008.  Pt moved to room 5503 to be monitored on camera and be closer to the nurses station patient, son Marthann Schiller informed. Pt had increased agitation, standing on the bed and pulled out IV. 0115 BP 202/135 HR 160-170's on telemetry. T.Callahan, NP notified and orders given for Ativan 2mg  IV and 4 point restraints. Ativan given and restraints initated. Pt's son Marthann Schiller notified and came by to see the patient. Pt currently in the bed and agitation has decreased, pt continues to fight with restraints and son has gone home due to increasing the patients agitation while visiting. Pt's BP 150/88 HR 130-140's on telemetry.  Julien Nordmann [facility]

## 2011-12-29 NOTE — Consult Note (Signed)
PULMONARY  / CRITICAL CARE MEDICINE  Name: Antonio Acosta MRN: [id number] DOB: 1950-01-30    LOS: 2  REFERRING MD :  [facility]  -Dr Jomarie Longs  CHIEF COMPLAINT:  Alcohol withdrawal seizure  BRIEF PATIENT DESCRIPTION: [name], heavy ETOH user adm 12/20 with slurred speech, tremulousness & confusion.Upon evaluation the emergency room he was noted to be tachycardic, hypertensive per EMS blood pressure of 230/130 initially, tremulous, and at one point in the emergency room room had an episode of seizure-like jerking movements but never quite lost consciousness per family, this was transient and resolved. UDS neg, ETOH < 11, Mg1.4 on adm.Treated as ETOH withdrawal with CIWA protocol. Rapid response to bedside on 12/22 am - 6mg  ativan given over 1 h for breakthrough  PCCM consulted emergently on 12/22 due to high ativan requirements - 2mg  q 2h & brief seizure witnessed by RN lasting <30sec    SIGNIFICANT EVENTS:  12/22 transferred to ICU - precedex started     PAST MEDICAL HISTORY :  Past Medical History  Diagnosis Date  . Hypertension    History reviewed. No pertinent past surgical history. Prior to Admission medications   Medication Sig Start Date End Date Taking? Authorizing Provider  Tetrahydrozoline HCl (VISINE OP) Place 1 drop into both eyes daily as needed. For dry eyes   Yes Historical Provider, MD   No Known Allergies  FAMILY HISTORY:  History reviewed. No pertinent family history. SOCIAL HISTORY:  reports that he has never smoked. He does not have any smokeless tobacco history on file. He reports that he drinks about 21 ounces of alcohol per week. He reports that he does not use illicit drugs.  REVIEW OF SYSTEMS:   Constitutional: Denies fever, chills, appetite change and fatigue,  POS diaphoresis, HEENT: Denies photophobia, eye pain, redness, hearing loss, ear pain, congestion, sore throat, rhinorrhea, sneezing, mouth sores, trouble swallowing, neck pain, neck stiffness and  tinnitus.  Respiratory: Denies SOB, DOE, cough, chest tightness, and wheezing.  Cardiovascular: Denies chest pain, palpitations and leg swelling.  Gastrointestinal: Denies nausea, vomiting, abdominal pain, diarrhea, constipation, blood in stool and abdominal distention.  Genitourinary: Denies dysuria, urgency, frequency, hematuria, flank pain and difficulty urinating.  Musculoskeletal: Denies myalgias, back pain, joint swelling, arthralgias and gait problem.  Skin: Denies pallor, rash and wound.  Neurological: Denies dizziness, seizures, syncope, weakness, light-headedness, numbness and headaches. POS tremors Hematological: Denies adenopathy. Easy bruising, personal or family bleeding history  Psychiatric/Behavioral: Denies suicidal ideation, mood changes, confusion, nervousness, sleep disturbance and agitation    INTERVAL HISTORY:   VITAL SIGNS: Temp:  [98.2 F (36.8 C)-98.8 F (37.1 C)] 98.2 F (36.8 C) (12/22 1600) Pulse Rate:  [108-158] 114  (12/22 0700) Resp:  [18-24] 21  (12/22 0700) BP: (123-202)/(74-135) 148/101 mmHg (12/22 1527) SpO2:  [95 %-99 %] 97 % (12/22 0700) HEMODYNAMICS:   VENTILATOR SETTINGS:   INTAKE / OUTPUT: Intake/Output      12/21 0701 - 12/22 0700 12/22 0701 - 12/23 0700   P.O. 240 180   I.V. (mL/kg) 766.3 (10.6) 678 (9.4)   Total Intake(mL/kg) 1006.3 (14) 858 (11.9)   Net +1006.3 +858        Urine Occurrence 4 x 575 x   Stool Occurrence 1 x      PHYSICAL EXAMINATION: Gen. Confused , well-nourished, in mild distress, anxious affect ENT - no lesions, no post nasal drip Neck: No JVD, no thyromegaly, no carotid bruits Lungs: no use of accessory muscles, no dullness to percussion, clear without rales or  rhonchi  Cardiovascular: Rhythm regular, heart sounds  normal, no murmurs, no peripheral edema Abdomen: soft and non-tender, no hepatosplenomegaly, BS normal. Musculoskeletal: No deformities, no cyanosis or clubbing Neuro:  alert, anxious, non focal,  tremors+ Skin:  Warm, no lesions/ rash    LABS: Cbc  Lab 12/28/11 0650 12/27/11 2103 12/27/11 1516  WBC 6.8 -- --  HGB 11.5* 12.5* 13.4  HCT 34.4* 35.9* 40.6  PLT 154 168 199    Chemistry   Lab 12/29/11 0819 12/28/11 0650 12/27/11 2103 12/27/11 1642 12/27/11 1516  NA 134* 132* -- -- 137  K 3.3* 3.5 -- -- 3.2*  CL 95* 94* -- -- 88*  CO2 23 23 -- -- 13*  BUN 7 4* -- -- 6  CREATININE 0.60 0.51 0.58 -- --  CALCIUM 9.8 9.2 -- -- 10.4  MG -- -- -- 1.4* --  PHOS -- -- -- -- --  GLUCOSE 94 112* -- -- 156*    Liver fxn  Lab 12/29/11 0819 12/28/11 0650 12/27/11 1516  AST 139* 128* 180*  ALT 91* 95* 129*  ALKPHOS 90 92 112  BILITOT 2.4* 1.7* 1.3*  PROT 8.0 8.0 9.3*  ALBUMIN 4.0 4.0 5.0   coags  Lab 12/28/11 0650 12/27/11 1516  APTT -- 30  INR 0.94 0.98   Sepsis markers  Lab 12/27/11 1840  LATICACIDVEN 2.4*  PROCALCITON --   Cardiac markers No results found for this basename: CKTOTAL:3,CKMB:3,TROPONINI:3 in the last 168 hours BNP No results found for this basename: PROBNP:3 in the last 168 hours ABG No results found for this basename: PHART:3,PCO2ART:3,PO2ART:3,HCO3:3,TCO2:3 in the last 168 hours  CBG trend  Lab 12/29/11 1118 12/29/11 0800  GLUCAP 101* 94    IMAGING:head CT 12/20 neg  ECG: 12/22 s. Tach, qt 452  DIAGNOSES: Active Problems:  Alcohol withdrawal  ETOH abuse  LFT elevation  Hypokalemia  Metabolic acidosis  Delirium tremens   ASSESSMENT / PLAN:  NEUROLOGIC  ASSESSMENT:  ETOH withdrawal, head CT neg No e/o Korsakoffs or wernicke's Ativan needs > what CIWA protocol provides  PLAN:   Start precedex gtt & titrate to CIWA < 8 Can use ativan IV for breakthrough agitation Would like to avoid restraints Thiamine/ folate  CARDIOVASCULAR  ASSESSMENT: high risk arrythmias, Qt ok PLAN:  monitor  RENAL  ASSESSMENT:  Hypokalemia/ Hypomag/ Mild hyponatremia Metabolic acidosis - AG 36 on adm decreased to 16, lactate nml PLAN:    Monitor   GASTROINTESTINAL  ASSESSMENT:  ETOH hepatitis PLAN:   FU LFTs intermittently  HEMATOLOGIC  ASSESSMENT:  Anemia coags ok PLAN:  Monitor, lovenox Ok  GLOBAL - Keep in ICU until no longer requires drips   Billy Fischer, MD ; [facility] service Mobile [phone].  After 5:30 PM or weekends, call [phone]

## 2011-12-29 NOTE — Progress Notes (Signed)
Benedetto Coons updates with pt status, in the bed and agitation has decreased, pt continues to fight with restraints. Pt's BP 150/88 HR 130-140's on telemetry. New orders given for one time dose of ativan 1mg  IV. Julien Nordmann [facility]

## 2011-12-29 NOTE — Progress Notes (Signed)
[facility] hospitalist progress note. Chief complaint. Worsening withdrawal symptoms. History of present illness. This 61 year old male hospitalized with symptoms of alcohol withdrawal. Patient was admitted and placed on CIWA protocol. Nursing called me indicating the patient was tachycardic and hypertensive earlier in the shift. I ordered the additional 2 mg of IV Ativan. Apparently the patient's agitation, confusion, tachycardia and hypertension did not improve but in fact, worsened and rapid response was called to the bedside. They found the patient says he was scored at 30. Pharmacy what was consulted and recommended the 2 mg of Ativan now, followed by 2 mg of Ativan in 30 minutes, followed by 2 mg of Ativan in 60 minutes for a total of 6 mg Ativan. It was also felt that the patient will require a higher level of monitoring and that a step down bed would be more appropriate. I also came to see the patient at bedside and find him alert but confused. He appears to be hallucinating. He is diaphoretic and tremulous. He has been placed in 4 point restraints for self protection. Vital signs. Temperature 98.3, pulse 134 come, respiration 23, blood pressure 139/86. O2 sats 97%. General appearance. Well-developed middle-aged male in clear delirium tremors. No evidence of distress. Cardiac. Tachycardic and regular. Lungs. Clear. Abdomen. Soft with positive bowel sounds. Impression/plan. Problem #1. Worsening withdrawal symptoms. As per pharmacy recommendations I am dosing the patient with 2 mg Ativan now, followed by 2 mg Ativan 30 minutes, followed by 2 mg of Ativan and 60 minutes for a total of 6 mg Ativan over the next hour. This will hopefully bring his CIWA score down below 20. Patient moved to step down unit for closer monitoring. Will continue restraints for now for patient's safety. I have increased baseline Ativan to 2 mg every 4 hours when necessary.

## 2011-12-30 LAB — BASIC METABOLIC PANEL
BUN: 13 mg/dL (ref 6–23)
Calcium: 9.4 mg/dL (ref 8.4–10.5)
Creatinine, Ser: 0.58 mg/dL (ref 0.50–1.35)
GFR calc Af Amer: >90 mL/min (ref >90)

## 2011-12-30 MED ORDER — LORAZEPAM 2 MG/ML IJ SOLN
1.0000 mg | INTRAMUSCULAR | Status: DC | PRN
  Administered 2012-01-02: 2 mg via INTRAVENOUS
  Filled 2011-12-30 (×2): qty 1

## 2011-12-30 MED ORDER — POTASSIUM CHLORIDE 10 MEQ/100ML IV SOLN
10.0000 meq | INTRAVENOUS | Status: AC
  Administered 2011-12-30 (×3): 10 meq via INTRAVENOUS
  Filled 2011-12-30: qty 300

## 2011-12-30 MED FILL — Sodium Chloride Flush IV Soln 0.9%: INTRAVENOUS | Qty: 10 | Status: AC

## 2011-12-30 NOTE — Progress Notes (Signed)
Agitation   Restrains renewed; continue precedex at 0.25mcg/kg/h; continue Ativan prn

## 2011-12-30 NOTE — Progress Notes (Signed)
PULMONARY  / CRITICAL CARE MEDICINE  Name: Antonio Acosta MRN: [id number] DOB: [birth year]    LOS: 3  REFERRING MD :  [facility]  -Dr Jomarie Longs  CHIEF COMPLAINT:  Alcohol withdrawal seizure  BRIEF PATIENT DESCRIPTION: [name], heavy ETOH user adm 12/20 with slurred speech, tremulousness & confusion.Upon evaluation the emergency room he was noted to be tachycardic, hypertensive per EMS blood pressure of 230/130 initially, tremulous, and at one point in the emergency room room had an episode of seizure-like jerking movements but never quite lost consciousness per family, this was transient and resolved. UDS neg, ETOH < 11, Mg1.4 on adm.Treated as ETOH withdrawal with CIWA protocol. Rapid response to bedside on 12/22 am - 6mg  ativan given over 1 h for breakthrough  PCCM consulted emergently on 12/22 due to high ativan requirements - 2mg  q 2h & brief seizure witnessed by RN lasting <30sec    SIGNIFICANT EVENTS:  12/22 transferred to ICU - precedex started   SUBJECTIVE/OVERNIGHT/INTERVAL HX 12/30/11 - RASS -1/-2, snoring heavy. On precedex. Has breakfast meal tray and he ate a little bit.      VITAL SIGNS: Temp:  [98.1 F (36.7 C)-98.8 F (37.1 C)] 98.8 F (37.1 C) (12/23 0728) Pulse Rate:  [69-114] 71  (12/23 1000) Resp:  [13-25] 16  (12/23 1000) BP: (113-169)/(77-139) 119/77 mmHg (12/23 1000) SpO2:  [86 %-100 %] 100 % (12/23 1000) HEMODYNAMICS:   VENTILATOR SETTINGS:   INTAKE / OUTPUT: Intake/Output      12/22 0701 - 12/23 0700 12/23 0701 - 12/24 0700   P.O. 180 300   I.V. (mL/kg) 1801.1 (25) 254.6 (3.5)   Total Intake(mL/kg) 1981.1 (27.5) 554.6 (7.7)   Net +1981.1 +554.6        Urine Occurrence 1125 x        PHYSICAL EXAMINATION: Gen. Ill in ICU. Snoring. Sitting in bed. RASS -2 ENT - no lesions, no post nasal drip Neck: No JVD, no thyromegaly, no carotid bruits Lungs: snoring no dullness to percussion, clear without rales or rhonchi  Cardiovascular: Rhythm regular, heart  sounds  normal, no murmurs, no peripheral edema Abdomen: soft and non-tender, no hepatosplenomegaly, BS normal. Musculoskeletal: No deformities, no cyanosis or clubbing Neuro:  alert, anxious, non focal, tremors+ Skin:  Warm, no lesions/ rash    LABS: Cbc  Lab 12/28/11 0650 12/27/11 2103 12/27/11 1516  WBC 6.8 -- --  HGB 11.5* 12.5* 13.4  HCT 34.4* 35.9* 40.6  PLT 154 168 199    Chemistry   Lab 12/30/11 0432 12/29/11 1649 12/29/11 0819 12/27/11 1642  NA 133* 133* 134* --  K 3.3* 3.5 3.3* --  CL 99 95* 95* --  CO2 20 21 23  --  BUN 13 11 7  --  CREATININE 0.58 0.62 0.60 --  CALCIUM 9.4 10.1 9.8 --  MG -- 1.8 -- 1.4*  PHOS -- -- -- --  GLUCOSE 107* 88 94 --    Liver fxn  Lab 12/29/11 0819 12/28/11 0650 12/27/11 1516  AST 139* 128* 180*  ALT 91* 95* 129*  ALKPHOS 90 92 112  BILITOT 2.4* 1.7* 1.3*  PROT 8.0 8.0 9.3*  ALBUMIN 4.0 4.0 5.0   coags  Lab 12/28/11 0650 12/27/11 1516  APTT -- 30  INR 0.94 0.98   Sepsis markers  Lab 12/27/11 1840  LATICACIDVEN 2.4*  PROCALCITON --   Cardiac markers No results found for this basename: CKTOTAL:3,CKMB:3,TROPONINI:3 in the last 168 hours BNP No results found for this basename: PROBNP:3 in the last 168 hours  ABG No results found for this basename: PHART:3,PCO2ART:3,PO2ART:3,HCO3:3,TCO2:3 in the last 168 hours  CBG trend  Lab 12/29/11 1118 12/29/11 0800  GLUCAP 101* 94    IMAGING:head CT 12/20 neg  No results found.   ECG: 12/22 s. Tach, qt 452  DIAGNOSES: Active Problems:  Alcohol withdrawal  ETOH abuse  LFT elevation  Hypokalemia  Metabolic acidosis  Delirium tremens   ASSESSMENT / PLAN:  NEUROLOGIC  ASSESSMENT:  ETOH withdrawal, head CT neg No e/o Korsakoffs or wernicke's Ativan needs > what CIWA protocol provides  On 12/30/11: RASS -1/-2 on precedex  PLAN:   Continue precedex Can use ativan IV for breakthrough agitation Would like to avoid restraints Thiamine/  folate  CARDIOVASCULAR  ASSESSMENT: high risk arrythmias, Qt ok PLAN:  monitor  RENAL  ASSESSMENT:  Hypokalemia/ Hypomag/ Mild hyponatremia at admit and repleted Metabolic acidosis - AG 36 on adm decreased to 16, lactate nml at admit  On 12/30/11: Mild Low K PLAN:   Replete K and monitor  GASTROINTESTINAL  ASSESSMENT:  ETOH hepatitis PLAN:   FU LFTs intermittently When well check Hepatitis virus  HEMATOLOGIC  ASSESSMENT:  Anemia coags ok PLAN:  Monitor, lovenox Ok   The patient is critically ill with multiple organ systems failure and requires high complexity decision making for assessment and support, frequent evaluation and titration of therapies, application of advanced monitoring technologies and extensive interpretation of multiple databases.   Critical Care Time devoted to patient care services described in this note is  45  Minutes.  Dr. Kalman Shan, M.D., [facility].C.P Pulmonary and Critical Care Medicine Staff Physician [facility]  Pulmonary and Critical Care Pager: [phone], If no answer or between  15:00h - 7:00h: call [phone]  12/30/2011 11:09 AM

## 2011-12-30 NOTE — Progress Notes (Signed)
Dr. Frederico Hamman aware of K level of 3.3 this am, no orders received.

## 2011-12-31 DIAGNOSIS — E872 Acidosis, unspecified: Secondary | ICD-10-CM

## 2011-12-31 LAB — CBC
HCT: 38.4 % — ABNORMAL LOW (ref 39.0–52.0)
MCHC: 33.9 g/dL (ref 30.0–36.0)
Platelets: 221 10*3/uL (ref 150–400)
RDW: 12.9 % (ref 11.5–15.5)
WBC: 7.9 10*3/uL (ref 4.0–10.5)

## 2011-12-31 LAB — BASIC METABOLIC PANEL
BUN: 10 mg/dL (ref 6–23)
Chloride: 94 mEq/L — ABNORMAL LOW (ref 96–112)
Creatinine, Ser: 0.63 mg/dL (ref 0.50–1.35)
GFR calc Af Amer: >90 mL/min (ref >90)
GFR calc non Af Amer: >90 mL/min (ref >90)
Potassium: 3.2 mEq/L — ABNORMAL LOW (ref 3.5–5.1)

## 2011-12-31 LAB — MAGNESIUM: Magnesium: 1.6 mg/dL (ref 1.5–2.5)

## 2011-12-31 MED ORDER — MAGNESIUM SULFATE 50 % IJ SOLN
2.0000 g | Freq: Once | INTRAVENOUS | Status: DC
  Filled 2011-12-31: qty 4

## 2011-12-31 MED ORDER — POTASSIUM CHLORIDE 10 MEQ/100ML IV SOLN
10.0000 meq | INTRAVENOUS | Status: AC
  Administered 2011-12-31 (×4): 10 meq via INTRAVENOUS
  Filled 2011-12-31: qty 100
  Filled 2011-12-31: qty 300

## 2011-12-31 MED ORDER — MAGNESIUM SULFATE 40 MG/ML IJ SOLN
2.0000 g | Freq: Once | INTRAMUSCULAR | Status: AC
  Administered 2011-12-31: 2 g via INTRAVENOUS
  Filled 2011-12-31: qty 50

## 2011-12-31 NOTE — Progress Notes (Addendum)
Clinical Social Work Department BRIEF PSYCHOSOCIAL ASSESSMENT 12/31/2011  Patient:  Antonio Acosta, Antonio Acosta     Account Number:  [id number]     Admit date:  12/27/2011  Clinical Social Worker:  Dennison Bulla  Date/Time:  12/31/2011 02:45 PM  Referred by:  Physician  Date Referred:  12/31/2011 Referred for  Substance Abuse   Other Referral:   Interview type:  Patient Other interview type:    PSYCHOSOCIAL DATA Living Status:  FAMILY Admitted from facility:   Level of care:   Primary support name:  Terri Primary support relationship to patient:  SPOUSE Degree of support available:   Strong    CURRENT CONCERNS Current Concerns  Substance Abuse   Other Concerns:    SOCIAL WORK ASSESSMENT / PLAN CSW received referral to assist with substance abuse treatment. CSW reviewed chart and met with patient at bedside. No visitors present.    CSW introduced myself and explained role. Patient agreeable to assessment. Patient reports he was admitted after having a seizure. Patient reports that he is married and has two grown sons. Patient reports that he has stopped drinking after seizure due to being scared of physically harming his body. Patient has been drinking all of his adult life. Patient's drinking began socially and recently has increased. Patient lost his job and his mother this year and began drinking more often. Patient reports he would drink a glass of wine for dinner, about 6 beers a day and a cocktail. Patient reports that he wants to stay sober after dc.    Patient reports that he has been feeling depressed and stressed over recent events but denies SI or HI. Patient reports that he has tried to reduce his consumption in the past but has not been successful. Patient reports he wants treatment at dc but struggles with the right treatment option. Patient reports that family desires inpatient treatment but he is unsure if this is the best option. CSW and patient discussed different  treatment options.    Patient was tearful throughout assessment and worried about treatment. CSW gave patient time to express concerns regarding friendships and relationships regarding treatment. CSW encouraged patient to reflect on stressors and coping skills to determine if he could remain sober without inpatient treatment. Patient is still undecided and wants to talk with wife. CSW and patient role played how patient could express his feelings openly with wife. Patient gave CSW permission to call wife but only to set up appointment for all to meet together. CSW explained CSW not available on 12/25 and scheduled time on 12/26 to meet with patient and wife. CSW provided patient with CSW contact information. CSW called wife and left a message stating a meeting would be held.    CSW will continue to follow and will assist as needed.   Assessment/plan status:  Psychosocial Support/Ongoing Assessment of Needs Other assessment/ plan:  SBIRT Information/referral to community resources:   Substance abuse treatment options    PATIENT'S/FAMILY'S RESPONSE TO PLAN OF CARE: Patient alert and oriented. Patient engaged throughout assessment and appreciative of CSW visit. Patient tearful and struggles with decisions at this time. Patient agreeable to CSW follow up with family present.

## 2011-12-31 NOTE — Progress Notes (Signed)
PULMONARY  / CRITICAL CARE MEDICINE  Name: Antonio Acosta MRN: [id number] DOB: 21-May-1950    LOS: 4  REFERRING MD :  [facility]  -Dr Jomarie Longs  CHIEF COMPLAINT:  Alcohol withdrawal seizure  BRIEF PATIENT DESCRIPTION: [name], heavy ETOH user adm 12/20 with slurred speech, tremulousness & confusion.Upon evaluation the emergency room he was noted to be tachycardic, hypertensive per EMS blood pressure of 230/130 initially, tremulous, and at one point in the emergency room room had an episode of seizure-like jerking movements but never quite lost consciousness per family, this was transient and resolved. UDS neg, ETOH < 11, Mg1.4 on adm.Treated as ETOH withdrawal with CIWA protocol. Rapid response to bedside on 12/22 am - 6mg  ativan given over 1 h for breakthrough  PCCM consulted emergently on 12/22 due to high ativan requirements - 2mg  q 2h & brief seizure witnessed by RN lasting <30sec    SIGNIFICANT EVENTS:  12/22 transferred to ICU - precedex started 12/30/11 - RASS -1/-2, snoring heavy. On precedex. Has breakfast meal tray and he ate a little bit.      SUBJECTIVE/OVERNIGHT/INTERVAL HX - 12/31/11- Doing well. Almost off precedex   VITAL SIGNS: Temp:  [98.4 F (36.9 C)-98.8 F (37.1 C)] 98.8 F (37.1 C) (12/24 1108) Pulse Rate:  [72-98] 98  (12/24 1000) Resp:  [13-23] 16  (12/24 1000) BP: (121-161)/(76-103) 144/92 mmHg (12/24 1000) SpO2:  [97 %-100 %] 100 % (12/24 1000) HEMODYNAMICS:   VENTILATOR SETTINGS:   INTAKE / OUTPUT: Intake/Output      12/23 0701 - 12/24 0700 12/24 0701 - 12/25 0700   P.O. 300 240   I.V. (mL/kg) 405.8 (5.6) 5.4 (0.1)   IV Piggyback 400 250   Total Intake(mL/kg) 1105.8 (15.3) 495.4 (6.9)   Urine (mL/kg/hr) 1600 (0.9)    Total Output 1600    Net -494.2 +495.4            PHYSICAL EXAMINATION: Gen. RASS +1. Oriented. Looks much better ENT - no lesions, no post nasal drip Neck: No JVD, no thyromegaly, no carotid bruits Lungs: snoring no dullness to  percussion, clear without rales or rhonchi  Cardiovascular: Rhythm regular, heart sounds  normal, no murmurs, no peripheral edema Abdomen: soft and non-tender, no hepatosplenomegaly, BS normal. Musculoskeletal: No deformities, no cyanosis or clubbing Neuro tremors+ but RASS +1. CAM-ICU neg for delirium Skin:  Warm, no lesions/ rash    LABS: Cbc  Lab 12/31/11 0500 12/28/11 0650 12/27/11 2103  WBC 7.9 -- --  HGB 13.0 11.5* 12.5*  HCT 38.4* 34.4* 35.9*  PLT 221 154 168    Chemistry   Lab 12/31/11 0500 12/30/11 0432 12/29/11 1649 12/27/11 1642  NA 131* 133* 133* --  K 3.2* 3.3* 3.5 --  CL 94* 99 95* --  CO2 19 20 21  --  BUN 10 13 11  --  CREATININE 0.63 0.58 0.62 --  CALCIUM 9.4 9.4 10.1 --  MG 1.6 -- 1.8 1.4*  PHOS 3.8 -- -- --  GLUCOSE 79 107* 88 --    Liver fxn  Lab 12/29/11 0819 12/28/11 0650 12/27/11 1516  AST 139* 128* 180*  ALT 91* 95* 129*  ALKPHOS 90 92 112  BILITOT 2.4* 1.7* 1.3*  PROT 8.0 8.0 9.3*  ALBUMIN 4.0 4.0 5.0   coags  Lab 12/28/11 0650 12/27/11 1516  APTT -- 30  INR 0.94 0.98   Sepsis markers  Lab 12/27/11 1840  LATICACIDVEN 2.4*  PROCALCITON --   Cardiac markers No results found for this basename: CKTOTAL:3,CKMB:3,TROPONINI:3  in the last 168 hours BNP No results found for this basename: PROBNP:3 in the last 168 hours ABG No results found for this basename: PHART:3,PCO2ART:3,PO2ART:3,HCO3:3,TCO2:3 in the last 168 hours  CBG trend  Lab 12/29/11 1118 12/29/11 0800  GLUCAP 101* 94    IMAGING:head CT 12/20 neg  No results found.   ECG: 12/22 s. Tach, qt 452  DIAGNOSES: Active Problems:  Alcohol withdrawal  ETOH abuse  LFT elevation  Hypokalemia  Metabolic acidosis  Delirium tremens   ASSESSMENT / PLAN:  NEUROLOGIC  ASSESSMENT:  ETOH withdrawal, head CT neg No e/o Korsakoffs or wernicke's Ativan needs > what CIWA protocol provides  On 12/31/11: Doinng real well almost off precedex  PLAN:   Ativan IV prn Wean  off precedex and observe for 12-24h, if well move out of ICU Would like to avoid restraints Thiamine/ folate  CARDIOVASCULAR  ASSESSMENT: high risk arrythmias, Qt ok PLAN:  monitor  RENAL  ASSESSMENT:  Hypokalemia/ Hypomag/ Mild hyponatremia at admit and repleted Metabolic acidosis - AG 36 on adm decreased to 16, lactate nml at admit  On 12/31/11: Mild Low K and mag PLAN:   Replete K and mag and monitor  GASTROINTESTINAL  ASSESSMENT:  ETOH hepatitis PLAN:   FU LFTs intermittently When well check Hepatitis virus  HEMATOLOGIC  ASSESSMENT:  Anemia coags ok PLAN:  Monitor, lovenox Ok      Dr. Kalman Shan, M.D., F.C.C.P Pulmonary and Critical Care Medicine Staff Physician [facility] [place] Pulmonary and Critical Care Pager: [phone], If no answer or between  15:00h - 7:00h: call [phone]  12/31/2011 11:08 AM

## 2012-01-01 DIAGNOSIS — R7989 Other specified abnormal findings of blood chemistry: Secondary | ICD-10-CM

## 2012-01-01 DIAGNOSIS — F1010 Alcohol abuse, uncomplicated: Secondary | ICD-10-CM

## 2012-01-01 LAB — BASIC METABOLIC PANEL
Calcium: 9.4 mg/dL (ref 8.4–10.5)
Creatinine, Ser: 0.67 mg/dL (ref 0.50–1.35)
GFR calc Af Amer: >90 mL/min (ref >90)
Sodium: 132 mEq/L — ABNORMAL LOW (ref 135–145)

## 2012-01-01 LAB — CBC
MCH: 32.2 pg (ref 26.0–34.0)
MCV: 90.6 fL (ref 78.0–100.0)
Platelets: 361 10*3/uL (ref 150–400)
RBC: 3.94 MIL/uL — ABNORMAL LOW (ref 4.22–5.81)
RDW: 13.2 % (ref 11.5–15.5)
WBC: 6.9 10*3/uL (ref 4.0–10.5)

## 2012-01-01 MED ORDER — LABETALOL HCL 5 MG/ML IV SOLN
10.0000 mg | INTRAVENOUS | Status: DC | PRN
  Filled 2012-01-01: qty 4

## 2012-01-01 NOTE — Progress Notes (Signed)
PULMONARY  / CRITICAL CARE MEDICINE  Name: Antonio Acosta MRN: [id number] DOB: 1950/01/15    LOS: 5  REFERRING MD :  [facility]  -Dr Jomarie Longs  CHIEF COMPLAINT:  Alcohol withdrawal seizure  BRIEF PATIENT DESCRIPTION: [name], heavy ETOH user adm 12/20 with slurred speech, tremulousness & confusion.Upon evaluation the emergency room he was noted to be tachycardic, hypertensive per EMS blood pressure of 230/130 initially, tremulous, and at one point in the emergency room room had an episode of seizure-like jerking movements but never quite lost consciousness per family, this was transient and resolved. UDS neg, ETOH < 11, Mg1.4 on adm.Treated as ETOH withdrawal with CIWA protocol. Rapid response to bedside on 12/22 am - 6mg  ativan given over 1 h for breakthrough  PCCM consulted emergently on 12/22 due to high ativan requirements - 2mg  q 2h & brief seizure witnessed by RN lasting <30sec    SIGNIFICANT EVENTS:  12/22 transferred to ICU - precedex started 12/30/11 - RASS -1/-2, snoring heavy. On precedex. Has breakfast meal tray and he ate a little bit.  12/25: off precedex and awake and alert.       SUBJECTIVE/OVERNIGHT/INTERVAL HX - 12/25: improved.  VITAL SIGNS: Temp:  [97.9 F (36.6 C)-98.8 F (37.1 C)] 98.8 F (37.1 C) (12/25 0807) Pulse Rate:  [88-137] 137  (12/25 1000) Resp:  [14-23] 23  (12/25 1000) BP: (124-159)/(74-105) 127/94 mmHg (12/25 1000) SpO2:  [97 %-100 %] 98 % (12/25 1000)    On RA   INTAKE / OUTPUT: Intake/Output      12/24 0701 - 12/25 0700 12/25 0701 - 12/26 0700   P.O. 720 240   I.V. (mL/kg) 5.4 (0.1)    IV Piggyback 350    Total Intake(mL/kg) 1075.4 (14.9) 240 (3.3)   Urine (mL/kg/hr) 1400 (0.8) 300 (1)   Total Output 1400 300   Net -324.6 -60        Urine Occurrence 3 x 1 x   Stool Occurrence 4 x        PHYSICAL EXAMINATION: Gen. RASS 0 Oriented. Looks much better. Off precedex ENT - no lesions, no post nasal drip Neck: No JVD, no thyromegaly, no  carotid bruits Lungs: clear Cardiovascular: Rhythm regular, heart sounds  normal, no murmurs, no peripheral edema Abdomen: soft and non-tender, no hepatosplenomegaly, BS normal. Musculoskeletal: No deformities, no cyanosis or clubbing Neuro awake and alert. Skin:  Warm, no lesions/ rash    LABS: Cbc  Lab 01/01/12 0415 12/31/11 0500 12/28/11 0650  WBC 6.9 -- --  HGB 12.7* 13.0 11.5*  HCT 35.7* 38.4* 34.4*  PLT 361 221 154    Chemistry   Lab 01/01/12 0415 12/31/11 0500 12/30/11 0432 12/29/11 1649  NA 132* 131* 133* --  K 4.9 3.2* 3.3* --  CL 95* 94* 99 --  CO2 24 19 20  --  BUN 11 10 13  --  CREATININE 0.67 0.63 0.58 --  CALCIUM 9.4 9.4 9.4 --  MG 2.0 1.6 -- 1.8  PHOS -- 3.8 -- --  GLUCOSE 103* 79 107* --    Liver fxn  Lab 12/29/11 0819 12/28/11 0650 12/27/11 1516  AST 139* 128* 180*  ALT 91* 95* 129*  ALKPHOS 90 92 112  BILITOT 2.4* 1.7* 1.3*  PROT 8.0 8.0 9.3*  ALBUMIN 4.0 4.0 5.0   coags  Lab 12/28/11 0650 12/27/11 1516  APTT -- 30  INR 0.94 0.98   Sepsis markers  Lab 12/27/11 1840  LATICACIDVEN 2.4*  PROCALCITON --   Cardiac markers No  results found for this basename: CKTOTAL:3,CKMB:3,TROPONINI:3 in the last 168 hours BNP No results found for this basename: PROBNP:3 in the last 168 hours ABG No results found for this basename: PHART:3,PCO2ART:3,PO2ART:3,HCO3:3,TCO2:3 in the last 168 hours  CBG trend  Lab 12/29/11 1118 12/29/11 0800  GLUCAP 101* 94    IMAGING:head CT 12/20 neg  No results found.   ECG: 12/22 s. Tach, qt 452  DIAGNOSES: Active Problems:  Alcohol withdrawal  ETOH abuse  LFT elevation  Hypokalemia  Metabolic acidosis  Delirium tremens   ASSESSMENT / PLAN:  NEUROLOGIC  ASSESSMENT:  ETOH withdrawal, head CT neg Improved  Off precedex  PLAN:   Ativan IV prn Thiamine/ folate tfr to floor  RENAL  ASSESSMENT:  Hypokalemia/ Hypomag/ Mild hyponatremia at admit and repleted Metabolic acidosis - AG 36 on adm  decreased to 16, lactate nml at admit All resolved  PLAN:   bmet in am   GASTROINTESTINAL  ASSESSMENT:  ETOH hepatitis  Improved  PLAN:   F/u labs  HEMATOLOGIC  ASSESSMENT:  Anemia coags ok PLAN:  Monitor, lovenox Janalyn Harder WrightMD Beeper  [phone]  Cell  [phone]  If no response or cell goes to voicemail, call beeper [phone]  01/01/2012 11:15 AM

## 2012-01-01 NOTE — Progress Notes (Signed)
Patient transferred from 2300. Skin intact. Placed on tele. B/P and Heart rate was high on arrival will continue to monitor.

## 2012-01-02 ENCOUNTER — Inpatient Hospital Stay (HOSPITAL_COMMUNITY): Payer: BC Managed Care – PPO

## 2012-01-02 LAB — CBC
HCT: 40.1 % (ref 39.0–52.0)
MCH: 31.7 pg (ref 26.0–34.0)
MCHC: 34.4 g/dL (ref 30.0–36.0)
MCV: 92 fL (ref 78.0–100.0)
Platelets: 280 10*3/uL (ref 150–400)
RDW: 13.1 % (ref 11.5–15.5)

## 2012-01-02 LAB — BASIC METABOLIC PANEL
BUN: 7 mg/dL (ref 6–23)
CO2: 24 mEq/L (ref 19–32)
Calcium: 9.7 mg/dL (ref 8.4–10.5)
Chloride: 96 mEq/L (ref 96–112)
Creatinine, Ser: 0.56 mg/dL (ref 0.50–1.35)
Glucose, Bld: 122 mg/dL — ABNORMAL HIGH (ref 70–99)

## 2012-01-02 MED ORDER — THIAMINE HCL 100 MG/ML IJ SOLN: 500.0000 mg | Freq: Every day | INTRAMUSCULAR | Status: DC

## 2012-01-02 MED ORDER — PROPRANOLOL HCL 20 MG PO TABS
20.0000 mg | ORAL_TABLET | Freq: Two times a day (BID) | ORAL | Status: DC
  Administered 2012-01-02 – 2012-01-06 (×9): 20 mg via ORAL
  Filled 2012-01-02 (×10): qty 1

## 2012-01-02 MED ORDER — GADOBENATE DIMEGLUMINE 529 MG/ML IV SOLN
15.0000 mL | Freq: Once | INTRAVENOUS | Status: AC
  Administered 2012-01-02: 15 mL via INTRAVENOUS

## 2012-01-02 MED ORDER — THIAMINE HCL 100 MG/ML IJ SOLN
500.0000 mg | INTRAVENOUS | Status: DC
  Administered 2012-01-02 – 2012-01-03 (×2): 500 mg via INTRAVENOUS
  Filled 2012-01-02 (×3): qty 5

## 2012-01-02 MED ORDER — LORAZEPAM 2 MG/ML IJ SOLN
2.0000 mg | Freq: Once | INTRAMUSCULAR | Status: AC
  Administered 2012-01-02: 2 mg via INTRAVENOUS

## 2012-01-02 MED ORDER — POTASSIUM CHLORIDE CRYS ER 20 MEQ PO TBCR
40.0000 meq | EXTENDED_RELEASE_TABLET | Freq: Once | ORAL | Status: AC
  Administered 2012-01-02: 40 meq via ORAL
  Filled 2012-01-02: qty 1

## 2012-01-02 NOTE — Progress Notes (Signed)
OT Cancellation Note   Treatment cancelled today due to patient receiving procedure or test. Will re-attempt later this PM as time allows.  01/02/2012 Cipriano Mile OTR/L Pager [phone] Office [phone]

## 2012-01-02 NOTE — Progress Notes (Signed)
Clinical Social Work  CSW met with patient, wife Antonio Acosta) and son Antonio Acosta) at bedside. CSW introduced myself to family. Patient reports he vaguely remembers CSW but is agreeable to CSW sharing information from session on Tuesday. CSW spoke openly with patient and family and facilitated a discussion regarding treatment. After discussion, patient agreeable to treatment at [facility]. Wife reports she has already been in contact with [facility]. Wife is to talk with admissions coordinator this afternoon. CSW agreeable to send clinicals when wife gets list from [facility]. CSW will continue to follow.  [place], [place] [phone]

## 2012-01-02 NOTE — Progress Notes (Signed)
Patient has been extremely confused throughout the night. Patient had to be placed on a bed alarm due to gait being unsteady and patient would continuously want to go out into the hallway to urinate. Patient would think that his bed alarm was someone trying to break into his house. Patient needs to be moved to a camera room once one is available. Will pass on to morning charge RN . Will continue to monitor.   Cincere Zorn J. Lendell Caprice RN BSN

## 2012-01-02 NOTE — Procedures (Signed)
History: 61 yo M with EtOH withdrawal, intermittent episodes of possible confabulation.   Sedation: Ativan several hours prior to EEG.   Background: There is a well defined posterior dominant rhythm of 8.5 Hz that attenuates with eye opening. There is excessive beta activity seen throughout the EEG.   Photic stimulation: Physiologic driving is present  EEG findings: 1) Excessive beta activity  Clinical Interpretation: This normal EEG is recorded in the waking and drowsy state. The beta activity seen is most consistent with medication effect as can be seen following benzodiazepine administration. There was no seizure or seizure predisposition recorded on this study.   Ritta Slot, MD [facility] [phone]  If 7pm- 7am, please page neurology on call at [phone].

## 2012-01-02 NOTE — Progress Notes (Signed)
Clinical Social Work  CSW met with patient at bedside. Patient confused and believes his monitor is a fishing device. Patient worried about finding his cell phone and his wife. CSW called wife Aurther Loft) who reports she is on her way to the hospital after running errands. Wife will call CSW once she has arrived in order to meet with CSW and patient face-to-face.  [place], [place] [phone]

## 2012-01-02 NOTE — Progress Notes (Signed)
EEG completed.

## 2012-01-02 NOTE — Progress Notes (Signed)
Patient's son believes the patient was more confused today than yesterday. I also noticed more confusion today with some wandering. Patient moved to camera room, closer to the nurses desk.

## 2012-01-02 NOTE — Evaluation (Signed)
Physical Therapy Evaluation Patient Details Name: Antonio Acosta MRN: [id number] DOB: 1950/08/26 Today's Date: 01/02/2012 Time: 4098-1191 PT Time Calculation (min): 26 min  PT Assessment / Plan / Recommendation Clinical Impression  Patient is a 61 yo male admitted for alcohol withdrawal. Patient with decreased coordination, ataxic movements, and decreased cognition impacting functional mobility.  Patient will benefit from acute PT to maximize independence/safety with mobility prior to discharge.  Anticipate mobility will continue to improve with time/detox.  Per chart, patient to discharge to [facility].    PT Assessment  Patient needs continued PT services    Follow Up Recommendations  No PT follow up;Supervision/Assistance - 24 hour    Does the patient have the potential to tolerate intense rehabilitation      Barriers to Discharge        Equipment Recommendations  None recommended by PT    Recommendations for Other Services     Frequency Min 3X/week    Precautions / Restrictions Precautions Precautions: Fall Restrictions Weight Bearing Restrictions: No   Pertinent Vitals/Pain       Mobility  Bed Mobility Bed Mobility: Supine to Sit;Sit to Supine Supine to Sit: 4: Min guard;With rails;HOB flat Sit to Supine: 4: Min guard;HOB flat Details for Bed Mobility Assistance: Verbal cues for safety and to move more slowly during transitions Transfers Transfers: Sit to Stand;Stand to Sit Sit to Stand: 4: Min assist;With upper extremity assist;From bed Stand to Sit: 4: Min assist;With upper extremity assist;To bed Details for Transfer Assistance: Verbal cues for hand placement and safety.  Assist for balance and safety.  Ambulation/Gait Ambulation/Gait Assistance: 4: Min assist Ambulation Distance (Feet): 400 Feet Assistive device: None Ambulation/Gait Assistance Details: Verbal cues to move at safe speed with gait.  Patient with decreased balance and staggering  gait.  Loss of balance x3 during gait, requiring assist to maintain upright position. Gait Pattern: Step-through pattern;Ataxic;Narrow base of support (Staggering) Gait velocity: Encouraged slower gait speed for safety           PT Diagnosis: Difficulty walking;Abnormality of gait;Altered mental status  PT Problem List: Decreased balance;Decreased mobility;Decreased coordination;Decreased cognition;Decreased safety awareness PT Treatment Interventions: Gait training;DME instruction;Functional mobility training;Balance training;Cognitive remediation;Patient/family education   PT Goals Acute Rehab PT Goals PT Goal Formulation: With patient Time For Goal Achievement: 01/09/12 Potential to Achieve Goals: Good Pt will go Sit to Stand: Independently;with upper extremity assist PT Goal: Sit to Stand - Progress: Goal set today Pt will go Stand to Sit: Independently;with upper extremity assist PT Goal: Stand to Sit - Progress: Goal set today Pt will Ambulate: >150 feet;Independently (without loss of balance) PT Goal: Ambulate - Progress: Goal set today Pt will Go Up / Down Stairs: 3-5 stairs;with supervision;with rail(s);with least restrictive assistive device PT Goal: Up/Down Stairs - Progress: Goal set today  Visit Information  Last PT Received On: 01/02/12 Assistance Needed: +1    Subjective Data  Subjective: "I'm at [facility]" Patient Stated Goal: To get help with drinking   Prior Functioning  Home Living Lives With: Spouse Available Help at Discharge: Family;Available 24 hours/day Type of Home: House Home Access: Stairs to enter [employer] of Steps: 7 Entrance Stairs-Rails: Right;Left Home Layout: Two level;Able to live on main level with bedroom/bathroom Bathroom Shower/Tub: Health visitor: Standard Home Adaptive Equipment: Grab bars in shower Prior Function Level of Independence: Independent Able to Take Stairs?: Yes Driving:  Yes Vocation: Retired Musician: No difficulties Dominant Hand: Right    Cognition  Overall Cognitive Status: Impaired Area of Impairment: Attention;Memory;Safety/judgement;Awareness of deficits Arousal/Alertness: Awake/alert Orientation Level: Disoriented to;Place Behavior During Session: Restless Current Attention Level: Sustained Attention - Other Comments: Easily distracted by things in environment Memory Deficits: States he came here 2 days ago, then later states he has been here a week and hasn't changed clothes. Safety/Judgement: Impulsive;Decreased safety judgement for tasks assessed;Decreased awareness of need for assistance Safety/Judgement - Other Comments: Needs cuing for safety and to wait for assistance.  Wanted to be able to get up on own. Awareness of Deficits: As above, wants to get up on own - does not recognize balance deficits or fall risk.    Extremity/Trunk Assessment Right Upper Extremity Assessment RUE ROM/Strength/Tone: WFL for tasks assessed (Noted tremors) RUE Sensation: WFL - Light Touch Left Upper Extremity Assessment LUE ROM/Strength/Tone: WFL for tasks assessed (Noted tremors) LUE Sensation: WFL - Light Touch Right Lower Extremity Assessment RLE ROM/Strength/Tone: WFL for tasks assessed (Tremors noted) RLE Sensation: WFL - Light Touch RLE Coordination: Deficits RLE Coordination Deficits: Ataxic movements with decreased coordination Left Lower Extremity Assessment LLE ROM/Strength/Tone: WFL for tasks assessed (Tremors noted) LLE Sensation: WFL - Light Touch LLE Coordination: Deficits LLE Coordination Deficits: Ataxic movements with decreased coordination   Balance    End of Session PT - End of Session Equipment Utilized During Treatment: Gait belt Activity Tolerance: Patient tolerated treatment well Patient left: in bed;with call bell/phone within reach;with bed alarm set Nurse Communication: Mobility status  GP     Vena Austria 01/02/2012, 3:30 PM Durenda Hurt. Renaldo Fiddler, [facility] Acute Rehab Services Pager [phone]

## 2012-01-02 NOTE — ED Provider Notes (Signed)
  I performed a history and physical examination of Antonio Acosta and discussed his management with Dr. March Rummage.  I agree with the history, physical, assessment, and plan of care, with the following exceptions: None  Patient moderately tremulous, but in NAD during my exam.  He was admitted for further E/M w suspicion of withdrawal as contributory factor in his shaking.  Elyse Jarvis, MD 01/02/12 [phone]

## 2012-01-02 NOTE — Progress Notes (Signed)
PATIENT DETAILS Name: Antonio Acosta Age: 61 y.o. Sex: male Date of Birth: [birth year] Admit Date: 12/27/2011 Admitting Physician Zannie Cove, MD PCP:No primary provider on file.  Subjective: Heavy alcoholic-went into DT's, required transfer to ICU for a precedex drip, upon stability transferred back to Hospitalist service. Family claims he was under a lot of stress after loosing his job and hence drinking heaving. Always has a chronic tremor-apparently his grandfather or father had parkinson's disease. This am-he seemed pleasant-answers most of my questions accurately atleast initially-then out of now where he adds something entirely different (?confabulating). Speech clear  Assessment/Plan: Active Problems:  Alcohol withdrawal -better -seems to be confabulating -no eyes signs on my exam -gait seems to be stable -will get MRI -continue with Ativan -explained to family-that this may take a while to resolve-may be permanent -c/w Thiamine-but will change to high dose for 3 days   ETOH abuse -have counseled extensively -appreciate IT sales professional input   LFT elevation -likely alcoholic hepatitis -monitor   Hypokalemia 2/2 to ETOH -replete and recheck   Metabolic acidosis -resolved  HTN -BP persistently elevate-has tremors (are chronic per family)-will try inderal  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   Gen Exam: Awake and alert with clear speech.  Confabulates at times Neck: Supple, No JVD.   Chest: B/L Clear.   CVS: S1 S2 Regular, no murmurs.  Abdomen: soft, BS +, non tender, non distended.  Extremities: no edema, lower extremities warm to touch. Neurologic: Non Focal.   Skin: No Rash.   Wounds: N/A.   Intake/Output from previous day:  Intake/Output Summary (Last 24 hours) at 01/02/12 1315 Last data filed at 01/02/12 0645  Gross per 24 hour  Intake      0 ml  Output    300 ml  Net   -300 ml     LAB RESULTS: CBC  Lab 01/02/12 0730 01/01/12 0415 12/31/11 0500 12/28/11 0650 12/27/11 2103  WBC 7.5 6.9 7.9 6.8 8.7  HGB 13.8 12.7* 13.0 11.5* 12.5*  HCT 40.1 35.7* 38.4* 34.4* 35.9*  PLT 280 361 221 154 168  MCV 92.0 90.6 91.9 92.0 91.1  MCH 31.7 32.2 31.1 30.7 31.7  MCHC 34.4 35.6 33.9 33.4 34.8  RDW 13.1 13.2 12.9 13.7 13.6  LYMPHSABS -- -- -- -- --  MONOABS -- -- -- -- --  EOSABS -- -- -- -- --  BASOSABS -- -- -- -- --  BANDABS -- -- -- -- --    Chemistries   Lab 01/02/12 0730 01/01/12 0415 12/31/11 0500 12/30/11 0432 12/29/11 1649 12/27/11 1642  NA 135 132* 131* 133* 133* --  K 3.2* 4.9 3.2* 3.3* 3.5 --  CL 96 95* 94* 99 95* --  CO2 24 24 19 20 21  --  GLUCOSE 122* 103* 79  107* 88 --  BUN 7 11 10 13 11  --  CREATININE 0.56 0.67 0.63 0.58 0.62 --  CALCIUM 9.7 9.4 9.4 9.4 10.1 --  MG -- 2.0 1.6 -- 1.8 1.4*    CBG:  Lab 12/29/11 1118 12/29/11 0800  GLUCAP 101* 94    GFR Estimated Creatinine Clearance: 101.4 ml/min (by C-G formula based on Cr of 0.56).  Coagulation profile  Lab 12/28/11 0650 12/27/11 1516  INR 0.94 0.98  PROTIME -- --    Cardiac Enzymes No results found for this basename: CK:3,CKMB:3,TROPONINI:3,MYOGLOBIN:3 in the last 168 hours  No components found with this basename: POCBNP:3 No results found for this basename: DDIMER:2 in the last 72 hours No results found for this basename: HGBA1C:2 in the last 72  hours No results found for this basename: CHOL:2,HDL:2,LDLCALC:2,TRIG:2,CHOLHDL:2,LDLDIRECT:2 in the last 72 hours No results found for this basename: TSH,T4TOTAL,FREET3,T3FREE,THYROIDAB in the last 72 hours No results found for this basename: VITAMINB12:2,FOLATE:2,FERRITIN:2,TIBC:2,IRON:2,RETICCTPCT:2 in the last 72 hours No results found for this basename: LIPASE:2,AMYLASE:2 in the last 72 hours  Urine Studies No results found for this basename: UACOL:2,UAPR:2,USPG:2,UPH:2,UTP:2,UGL:2,UKET:2,UBIL:2,UHGB:2,UNIT:2,UROB:2,ULEU:2,UEPI:2,UWBC:2,URBC:2,UBAC:2,CAST:2,CRYS:2,UCOM:2,BILUA:2 in the last 72 hours  MICROBIOLOGY: Recent Results (from the past 240 hour(s))  MRSA PCR SCREENING     Status: Normal   Collection Time   12/29/11  5:39 AM      Component Value Range Status Comment   MRSA by PCR NEGATIVE  NEGATIVE Final     RADIOLOGY STUDIES/RESULTS: Ct Head Wo Contrast  12/27/2011  *RADIOLOGY REPORT*  Clinical Data: Transiting episode of slurred speech and tremors.  CT HEAD WITHOUT CONTRAST  Technique:  Contiguous axial images were obtained from the base of the skull through the vertex without contrast.  Comparison: None.  Findings: No acute cortical infarct, hemorrhage, or mass lesion is present.  Mild periventricular and subcortical white matter hypoattenuation is noted.  Mild generalized atrophy is present as well.  The ventricles are of normal size.  No significant extra- axial fluid collection is present.  There is partial opacification of the anterior left ethmoid air cells.  The paranasal sinuses and mastoid air cells are otherwise clear.  The osseous skull is intact.  IMPRESSION:  1.  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 The finding is nonspecific but can be seen in the setting of chronic microvascular ischemia, a demyelinating process such as multiple sclerosis, vasculitis, complicated migraine headaches, or as the sequelae of a prior infectious or inflammatory  process. 2.  Minimal ethmoid sinus disease.   Original Report Authenticated By: Marin Roberts, M.D.     MEDICATIONS: Scheduled Meds:   . enoxaparin (LOVENOX) injection  40 mg Subcutaneous Q24H  . folic acid  1 mg Oral Daily  . multivitamin with minerals  1 tablet Oral Daily  . propranolol  20 mg Oral BID  . sodium chloride  3 mL Intravenous Q12H  . thiamine (VITAMIN B1) IVPB  500 mg Intravenous Q24H   Continuous Infusions:  PRN Meds:.labetalol, LORazepam  Antibiotics: Anti-infectives    None       Jeoffrey Massed, MD  [facility] Pager:[phone]  If 7PM-7AM, please contact night-coverage [email or web address] Password [email or web address] 01/02/2012, 1:15 PM   LOS: 6 days

## 2012-01-03 LAB — CBC
Hemoglobin: 12.5 g/dL — ABNORMAL LOW (ref 13.0–17.0)
MCH: 31.5 pg (ref 26.0–34.0)
MCHC: 34.4 g/dL (ref 30.0–36.0)
RDW: 12.9 % (ref 11.5–15.5)

## 2012-01-03 LAB — COMPREHENSIVE METABOLIC PANEL
Albumin: 3.8 g/dL (ref 3.5–5.2)
BUN: 7 mg/dL (ref 6–23)
Calcium: 9.6 mg/dL (ref 8.4–10.5)
Chloride: 96 mEq/L (ref 96–112)
Creatinine, Ser: 0.54 mg/dL (ref 0.50–1.35)
GFR calc non Af Amer: >90 mL/min (ref >90)
Total Bilirubin: 1.3 mg/dL — ABNORMAL HIGH (ref 0.3–1.2)

## 2012-01-03 LAB — MAGNESIUM: Magnesium: 1.9 mg/dL (ref 1.5–2.5)

## 2012-01-03 MED ORDER — POTASSIUM CHLORIDE CRYS ER 20 MEQ PO TBCR
40.0000 meq | EXTENDED_RELEASE_TABLET | Freq: Once | ORAL | Status: AC
  Administered 2012-01-03: 40 meq via ORAL
  Filled 2012-01-03: qty 2

## 2012-01-03 MED ORDER — MAGNESIUM SULFATE 40 MG/ML IJ SOLN
2.0000 g | Freq: Once | INTRAMUSCULAR | Status: AC
  Administered 2012-01-03: 2 g via INTRAVENOUS
  Filled 2012-01-03: qty 50

## 2012-01-03 NOTE — Progress Notes (Signed)
Physical Therapy Discharge Patient Details Name: Antonio Acosta MRN: [id number] DOB: [birth year] Today's Date: 01/03/2012 Time: 4098-1191 PT Time Calculation (min): 13 min  Patient discharged from PT services secondary to goals met and no further PT needs identified.  Please see latest therapy progress note for current level of functioning and progress toward goals.    Progress and discharge plan discussed with patient and/or caregiver: Patient/Caregiver agrees with plan  GP     Vena Austria 01/03/2012, 4:45 PM

## 2012-01-03 NOTE — Progress Notes (Signed)
Clinical Social Work  CSW met with patient and wife at bedside. Patient still agreeable to [facility]. Wife reports she spoke with admissions who stated they would have a bed available on Saturday or Monday. CSW got patient to sign ROI and CSW spoke with Chrissie Noa at [facility]. CSW faxed clinicals to [facility] who call CSW once they have reviewed information. CSW updated MD on plans. CSW will continue to follow.  [place], [place] [phone]

## 2012-01-03 NOTE — Evaluation (Signed)
Occupational Therapy Evaluation Patient Details Name: Antonio Acosta MRN: [id number] DOB: [birth year] Today's Date: 01/03/2012 Time: 4098-1191 OT Time Calculation (min): 21 min  OT Assessment / Plan / Recommendation Clinical Impression  Pleasant 61 yr old male admitted for confusion and ETOH abuse.  Overall at an independent/ modified independent level for simulated selfcare tasks.  Able to demonstrate appropriate awareness, orientation, and memory during session. Still exhibits increased bilateral tremors but reports they are closer to his baseline today. Feel he is close to his functional baseline overall and does not warrant any further OT needs at this time.    OT Assessment  Patient does not need any further OT services    Follow Up Recommendations  No OT follow up       Equipment Recommendations  None recommended by OT          Precautions / Restrictions Precautions Precautions: None Restrictions Weight Bearing Restrictions: No   Pertinent Vitals/Pain No report of pain, vitals stable    ADL  Eating/Feeding: Simulated;Modified independent Where Assessed - Eating/Feeding: Edge of bed Grooming: Simulated;Independent Where Assessed - Grooming: Unsupported standing Upper Body Bathing: Simulated;Independent Where Assessed - Upper Body Bathing: Unsupported standing Lower Body Bathing: Simulated;Independent Where Assessed - Lower Body Bathing: Unsupported standing Upper Body Dressing: Simulated;Independent Where Assessed - Upper Body Dressing: Unsupported sitting Lower Body Dressing: Simulated;Independent Where Assessed - Lower Body Dressing: Unsupported sit to stand Toilet Transfer: Simulated;Independent Toilet Transfer Method: Other (comment) (ambulate without assistive device) Toilet Transfer Equipment: Regular height toilet Toileting - Clothing Manipulation and Hygiene: Simulated;Independent Where Assessed - Toileting Clothing Manipulation and Hygiene: Sit to stand from  3-in-1 or toilet Tub/Shower Transfer: Simulated;Independent Tub/Shower Transfer Method: Ambulating Transfers/Ambulation Related to ADLs: Pt overall independent with mobility in the room and out in the hall. ADL Comments: Pt overall independent with simulated selfcare tasks.  He does exhibit slight tremors in bilateral hands which he reports is better today than yesterday.  He does state this has been chronic for a while.  Reports no issues with feeding himself or tying his shoes however he was currently unable to tie his gown today.        Visit Information  Last OT Received On: 01/03/12 Assistance Needed: +1    Subjective Data  Subjective: I feel a lot better today. Patient Stated Goal: To go to rehab and get mental help.   Prior Functioning     Home Living Lives With: Spouse Available Help at Discharge: Family;Available 24 hours/day Type of Home: House Home Access: Stairs to enter [employer] of Steps: 7 Entrance Stairs-Rails: Right;Left Home Layout: Two level;Able to live on main level with bedroom/bathroom Bathroom Shower/Tub: Health visitor: Standard Home Adaptive Equipment: Grab bars in shower Prior Function Level of Independence: Independent Able to Take Stairs?: Yes Driving: Yes Vocation: Retired Musician: No difficulties Dominant Hand: Right         Vision/Perception Vision - Assessment Eye Alignment: Within Functional Limits Vision Assessment: Vision not tested Perception Perception: Within Functional Limits Praxis Praxis: Intact   Cognition  Overall Cognitive Status: Appears within functional limits for tasks assessed/performed Orientation Level: Appears intact for tasks assessed Behavior During Session: [facility] for tasks performed Current Attention Level: Sustained Cognition - Other Comments: Pt 100% on 3 word recall after 10 minute delay.    Extremity/Trunk Assessment Right Upper Extremity  Assessment RUE ROM/Strength/Tone: Within functional levels RUE Sensation: WFL - Light Touch RUE Coordination: Deficits RUE Coordination Deficits: Pt with termors at rest and  with activity, not as severe today per his report. Left Upper Extremity Assessment LUE ROM/Strength/Tone: Within functional levels LUE Sensation: WFL - Light Touch LUE Coordination: Deficits LUE Coordination Deficits: Pt with termors at rest and with activity, not as severe today per his report. Trunk Assessment Trunk Assessment: Normal     Mobility Bed Mobility Supine to Sit: 7: Independent Transfers Transfers: Sit to Stand Sit to Stand: 7: Independent;Without upper extremity assist;From chair/3-in-1;From toilet Stand to Sit: 7: Independent           Balance Balance Balance Assessed: Yes Dynamic Standing Balance Dynamic Standing - Balance Support: No upper extremity supported Dynamic Standing - Level of Assistance: 7: Independent Dynamic Standing - Balance Activities: Reaching for objects High Level Balance High Level Balance Comments: Pt with difficulty maintaining tandem standing but did exhibit stepping strategy appropriately.   End of Session OT - End of Session Activity Tolerance: Patient tolerated treatment well Patient left: in bed;with call bell/phone within reach;with chair alarm set Nurse Communication: Mobility status     Jovian Lembcke OTR/L Pager number [phone] 01/03/2012, 1:09 PM

## 2012-01-03 NOTE — Progress Notes (Signed)
Clinical Social Work  CSW spoke with [facility] who reports they do not feel comfortable accepting patient over the weekend. They want to see if patient's confusion clears so he will be able to interact with groups and treatment. CSW informed MD and wife of this plan. CSW will continue to follow to assist with dc planning.  [place], [place] [phone]

## 2012-01-03 NOTE — Progress Notes (Signed)
Physical Therapy Treatment Patient Details Name: Nolawi Kanady MRN: [id number] DOB: 1950/05/12 Today's Date: 01/03/2012 Time: 4098-1191 PT Time Calculation (min): 13 min  PT Assessment / Plan / Recommendation Comments on Treatment Session  Patient with much improved cognition and mobility today.  Scored 24/24 on DGI balance assessment.  Achieved goals - no further PT needs.  PT will sign off.    Follow Up Recommendations  No PT follow up     Does the patient have the potential to tolerate intense rehabilitation     Barriers to Discharge        Equipment Recommendations  None recommended by PT    Recommendations for Other Services    Frequency Min 3X/week   Plan All goals met and education completed, patient dischaged from PT services    Precautions / Restrictions Precautions Precautions: None Restrictions Weight Bearing Restrictions: No   Pertinent Vitals/Pain     Mobility  Bed Mobility Bed Mobility: Not assessed Supine to Sit: 7: Independent Transfers Transfers: Sit to Stand;Stand to Sit Sit to Stand: 7: Independent;From chair/3-in-1 Stand to Sit: 7: Independent;To chair/3-in-1 Details for Transfer Assistance: No cues or assist needed Ambulation/Gait Ambulation/Gait Assistance: 7: Independent Ambulation Distance (Feet): 500 Feet Assistive device: None Ambulation/Gait Assistance Details: Patient with good balance and gait pattern.  No staggering today. Gait Pattern: Within Functional Limits Gait velocity: WFL Stairs: Yes Stairs Assistance: 7: Independent Stair Management Technique: No rails;Alternating pattern Number of Stairs: 5  (x2)      PT Goals Acute Rehab PT Goals PT Goal Formulation: With patient PT Goal: Sit to Stand - Progress: Met PT Goal: Stand to Sit - Progress: Met PT Goal: Ambulate - Progress: Met PT Goal: Up/Down Stairs - Progress: Met  Visit Information  Last PT Received On: 01/03/12 Assistance Needed: +1    Subjective Data  Subjective: "I've been up walking with the nurse and with OT."   Cognition  Overall Cognitive Status: Appears within functional limits for tasks assessed/performed Arousal/Alertness: Awake/alert Orientation Level: Appears intact for tasks assessed Behavior During Session: [facility] for tasks performed Current Attention Level: Sustained Cognition - Other Comments: Much improved cognition today.    Balance  Balance Balance Assessed: Yes Dynamic Standing Balance Dynamic Standing - Balance Support: No upper extremity supported Dynamic Standing - Level of Assistance: 7: Independent Dynamic Standing - Balance Activities: Reaching for objects Standardized Balance Assessment Standardized Balance Assessment: Dynamic Gait Index Dynamic Gait Index Level Surface: Normal Change in Gait Speed: Normal Gait with Horizontal Head Turns: Normal Gait with Vertical Head Turns: Normal Gait and Pivot Turn: Normal Step Over Obstacle: Normal Step Around Obstacles: Normal Steps: Normal Total Score: 24  High Level Balance High Level Balance Comments: Pt with difficulty maintaining tandem standing but did exhibit stepping strategy appropriately.  End of Session PT - End of Session Activity Tolerance: Patient tolerated treatment well Patient left: in chair;with call bell/phone within reach Nurse Communication: Mobility status   GP     Vena Austria 01/03/2012, 4:45 PM Durenda Hurt. Renaldo Fiddler, [facility] Acute Rehab Services Pager [phone]

## 2012-01-03 NOTE — Progress Notes (Signed)
PATIENT DETAILS Name: Antonio Acosta Age: 61 y.o. Sex: male Date of Birth: December 25, 1950 Admit Date: 12/27/2011 Admitting Physician Zannie Cove, MD PCP:No primary provider on file.  Subjective: Heavy alcoholic-went into DT's, required transfer to ICU for a precedex drip, upon stability transferred back to Hospitalist service. Family claims he was under a lot of stress after loosing his job and hence drinking heaving. Always has a chronic tremor-apparently his grandfather or father had parkinson's disease. Less confused compared to yesterday.  Assessment/Plan: Active Problems:  Alcohol withdrawal -better-compared to yesterday -seems to be confabulating -no eyes signs on my exam -gait seems to be stable-walked him in the hallway - MRI Brain-no acute abnormalities -EEG-no epileptiform wave -continue with prn Ativan -explained to family-that this may take a while to resolve-may be permanent -c/w Thiamine- 500 mg till tomorrow-[facility] will then [facility]   ETOH abuse -have counseled extensively -appreciate IT sales professional input   LFT elevation -likely alcoholic hepatitis -monitor-AST and ALT still high-repeat again in am -avoid hepatotoxic meds   Hypokalemia 2/2 to ETOH -replete and recheck   Metabolic acidosis -resolved  HTN -BP persistently elevated-has tremors (are chronic per family)-trial inderal started yesterday-continue  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 -MRI Brain-no acute abnormalities -EEG-no epileptiform activity -further w/u as outpatient-family claims they will take him to Neurology once he is done with ETOH rehab  Disposition: Remain inpatient-to [facility]  DVT Prophylaxis: Prophylactic Lovenox  Code Status: Full code   Procedures:  None  CONSULTS:  pulmonary/intensive care  PHYSICAL EXAM: Vital signs in last 24 hours: Filed Vitals:   01/02/12 0504 01/02/12 1447 01/02/12 2100 01/03/12 0500    BP: 160/97 161/97 148/85 132/78  Pulse: 97 120 88 88  Temp: 98.1 F (36.7 C) 97.4 F (36.3 C) 97.9 F (36.6 C) 98.5 F (36.9 C)  TempSrc: Oral Oral Oral Axillary  Resp: 10 18 20 16   Height:      Weight:      SpO2: 99% 100% 99% 98%    Weight change:  Body mass index is 21.56 kg/(m^2).   Gen Exam: Awake and alert with clear speech.  Confabulates at times Neck: Supple, No JVD.   Chest: B/L Clear.   CVS: S1 S2 Regular, no murmurs.  Abdomen: soft, BS +, non tender, non distended.  Extremities: no edema, lower extremities warm to touch. Neurologic: Non Focal.   Skin: No Rash.   Wounds: N/A.   Intake/Output from previous day:  Intake/Output Summary (Last 24 hours) at 01/03/12 1046 Last data filed at 01/03/12 0216  Gross per 24 hour  Intake    290 ml  Output    300 ml  Net    -10 ml     LAB RESULTS: CBC  Lab 01/03/12 0623 01/02/12 0730 01/01/12 0415 12/31/11 0500 12/28/11 0650  WBC 6.1 7.5 6.9 7.9 6.8  HGB 12.5* 13.8 12.7* 13.0 11.5*  HCT 36.3* 40.1 35.7* 38.4* 34.4*  PLT 296 280 361 221 154  MCV 91.4 92.0 90.6 91.9 92.0  MCH 31.5 31.7 32.2 31.1 30.7  MCHC 34.4 34.4 35.6 33.9 33.4  RDW 12.9 13.1 13.2 12.9 13.7  LYMPHSABS -- -- -- -- --  MONOABS -- -- -- -- --  EOSABS -- -- -- -- --  BASOSABS -- -- -- -- --  BANDABS -- -- -- -- --    Chemistries   Lab 01/03/12 0623 01/02/12 0730 01/01/12 0415 12/31/11 0500 12/30/11 0432 12/29/11 1649 12/27/11 1642  NA 133*  135 132* 131* 133* -- --  K 3.4* 3.2* 4.9 3.2* 3.3* -- --  CL 96 96 95* 94* 99 -- --  CO2 21 24 24 19 20  -- --  GLUCOSE 87 122* 103* 79 107* -- --  BUN 7 7 11 10 13  -- --  CREATININE 0.54 0.56 0.67 0.63 0.58 -- --  CALCIUM 9.6 9.7 9.4 9.4 9.4 -- --  MG 1.9 -- 2.0 1.6 -- 1.8 1.4*    CBG:  Lab 12/29/11 1118 12/29/11 0800  GLUCAP 101* 94    GFR Estimated Creatinine Clearance: 101.4 ml/min (by C-G formula based on Cr of 0.54).  Coagulation profile  Lab 12/28/11 0650 12/27/11 1516  INR 0.94  0.98  PROTIME -- --    Cardiac Enzymes No results found for this basename: CK:3,CKMB:3,TROPONINI:3,MYOGLOBIN:3 in the last 168 hours  No components found with this basename: POCBNP:3 No results found for this basename: DDIMER:2 in the last 72 hours No results found for this basename: HGBA1C:2 in the last 72 hours No results found for this basename: CHOL:2,HDL:2,LDLCALC:2,TRIG:2,CHOLHDL:2,LDLDIRECT:2 in the last 72 hours No results found for this basename: TSH,T4TOTAL,FREET3,T3FREE,THYROIDAB in the last 72 hours No results found for this basename: VITAMINB12:2,FOLATE:2,FERRITIN:2,TIBC:2,IRON:2,RETICCTPCT:2 in the last 72 hours No results found for this basename: LIPASE:2,AMYLASE:2 in the last 72 hours  Urine Studies No results found for this basename: UACOL:2,UAPR:2,USPG:2,UPH:2,UTP:2,UGL:2,UKET:2,UBIL:2,UHGB:2,UNIT:2,UROB:2,ULEU:2,UEPI:2,UWBC:2,URBC:2,UBAC:2,CAST:2,CRYS:2,UCOM:2,BILUA:2 in the last 72 hours  MICROBIOLOGY: Recent Results (from the past 240 hour(s))  MRSA PCR SCREENING     Status: Normal   Collection Time   12/29/11  5:39 AM      Component Value Range Status Comment   MRSA by PCR NEGATIVE  NEGATIVE Final     RADIOLOGY STUDIES/RESULTS: Ct Head Wo Contrast  12/27/2011  *RADIOLOGY REPORT*  Clinical Data: Transiting episode of slurred speech and tremors.  CT HEAD WITHOUT CONTRAST  Technique:  Contiguous axial images were obtained from the base of the skull through the vertex without contrast.  Comparison: None.  Findings: No acute cortical infarct, hemorrhage, or mass lesion is present.  Mild periventricular and subcortical white matter hypoattenuation is noted.  Mild generalized atrophy is present as well.  The ventricles are of normal size.  No significant extra- axial fluid collection is present.  There is partial opacification of the anterior left ethmoid air cells.  The paranasal sinuses and mastoid air cells are otherwise clear.  The osseous skull is intact.   IMPRESSION:  1.  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 The finding is nonspecific but can be seen in the setting of chronic microvascular ischemia, a demyelinating process such as multiple sclerosis, vasculitis, complicated migraine headaches, or as the sequelae of a prior infectious or inflammatory process. 2.  Minimal ethmoid sinus disease.   Original Report Authenticated By: Marin Roberts, M.D.     MEDICATIONS: Scheduled Meds:    . enoxaparin (LOVENOX) injection  40 mg Subcutaneous Q24H  . folic acid  1 mg Oral Daily  . multivitamin with minerals  1 tablet Oral Daily  . propranolol  20 mg Oral BID  . sodium chloride  3 mL Intravenous Q12H  . thiamine (VITAMIN B1) IVPB  500 mg Intravenous Q24H   Continuous Infusions:  PRN Meds:.labetalol, LORazepam  Antibiotics: Anti-infectives    None       Jeoffrey Massed, MD  [facility] Pager:[phone]  If 7PM-7AM, please contact night-coverage [email or web address] Password [email or web address] 01/03/2012, 10:46 AM   LOS: 7 days

## 2012-01-04 DIAGNOSIS — F10939 Alcohol use, unspecified with withdrawal, unspecified: Secondary | ICD-10-CM

## 2012-01-04 DIAGNOSIS — F10239 Alcohol dependence with withdrawal, unspecified: Secondary | ICD-10-CM

## 2012-01-04 DIAGNOSIS — F1010 Alcohol abuse, uncomplicated: Secondary | ICD-10-CM

## 2012-01-04 LAB — COMPREHENSIVE METABOLIC PANEL
ALT: 220 U/L — ABNORMAL HIGH (ref 0–53)
AST: 198 U/L — ABNORMAL HIGH (ref 0–37)
CO2: 26 mEq/L (ref 19–32)
Calcium: 9.9 mg/dL (ref 8.4–10.5)
Potassium: 4.8 mEq/L (ref 3.5–5.1)
Sodium: 133 mEq/L — ABNORMAL LOW (ref 135–145)
Total Protein: 7.5 g/dL (ref 6.0–8.3)

## 2012-01-04 LAB — MAGNESIUM: Magnesium: 2.2 mg/dL (ref 1.5–2.5)

## 2012-01-04 NOTE — Progress Notes (Addendum)
[facility] PROGRESS NOTE Assessment/Plan: Alcohol withdrawal (12/27/2011): -CIWA 0 no further ativan. - ? Delirium CIWA low avoid ativan. -gait seems to be stable-walked him in the hallway  - MRI Brain 12.27.2013 -no acute abnormalities  -EEG 12.28.2013-no epileptiform wave    ETOH abuse (12/27/2011) - counseling. - [facility].  LFT elevation (12/27/2011): - likely secondary to alcohol abuse. -On admission his LFTs mildly elevated with 2-1 the increased by the second day after continue monitoring his LFTs started to trend down. He remained asymptomatic.   Hypokalemia (12/27/2011) - 2/2 to ETOH  -replete  Metabolic acidosis (12/27/2011) - likely multifactorial secondary to alcohol, salicylates and lactic acid. This was treated conservatively with IV fluids.    Code Status: Full Family Communication: Family  Disposition Plan: Home   Consultants:  None  Procedures:  EEG 12/27/2011 showed no elliptical weighs  MRI of the brain 01/03/2012 show no acute intracranial abnormalities.  Antibiotics:  None   HPI/Subjective: No complaints, as per  Family still mildly confused.  Objective: Filed Vitals:   01/02/12 2100 01/03/12 0500 01/03/12 2100 01/04/12 0500  BP: 148/85 132/78 154/92 130/80  Pulse: 88 88 84 92  Temp: 97.9 F (36.6 C) 98.5 F (36.9 C) 98.4 F (36.9 C) 98.3 F (36.8 C)  TempSrc: Oral Axillary Oral Oral  Resp: 20 16 14 12   Height:      Weight:      SpO2: 99% 98% 99% 100%   No intake or output data in the 24 hours ending 01/04/12 1006 Filed Weights   12/27/11 2117  Weight: 72.1 kg (158 lb 15.2 oz)    Exam:  General: Alert, awake, oriented x2, in no acute distress.  HEENT: No bruits, no goiter.  Heart: Regular rate and rhythm, without murmurs, rubs, gallops.  Lungs: Good air movement, clear to auscultation  Abdomen: Soft, nontender, nondistended, positive bowel sounds.  Neuro: Grossly intact, nonfocal.   Data  Reviewed: Basic Metabolic Panel:  Lab 01/04/12 4782 01/03/12 0623 01/02/12 0730 01/01/12 0415 12/31/11 0500 12/29/11 1649  NA 133* 133* 135 132* 131* --  K 4.8 3.4* 3.2* 4.9 3.2* --  CL 96 96 96 95* 94* --  CO2 26 21 24 24 19  --  GLUCOSE 94 87 122* 103* 79 --  BUN 11 7 7 11 10  --  CREATININE 0.84 0.54 0.56 0.67 0.63 --  CALCIUM 9.9 9.6 9.7 9.4 9.4 --  MG 2.2 1.9 -- 2.0 1.6 1.8  PHOS -- 4.1 -- -- 3.8 --   Liver Function Tests:  Lab 01/04/12 0453 01/03/12 0623 12/29/11 0819  AST 198* 203* 139*  ALT 220* 206* 91*  ALKPHOS 84 87 90  BILITOT 1.1 1.3* 2.4*  PROT 7.5 7.6 8.0  ALBUMIN 3.7 3.8 4.0   No results found for this basename: LIPASE:5,AMYLASE:5 in the last 168 hours No results found for this basename: AMMONIA:5 in the last 168 hours CBC:  Lab 01/03/12 0623 01/02/12 0730 01/01/12 0415 12/31/11 0500  WBC 6.1 7.5 6.9 7.9  NEUTROABS -- -- -- --  HGB 12.5* 13.8 12.7* 13.0  HCT 36.3* 40.1 35.7* 38.4*  MCV 91.4 92.0 90.6 91.9  PLT 296 280 361 221   Cardiac Enzymes: No results found for this basename: CKTOTAL:5,CKMB:5,CKMBINDEX:5,TROPONINI:5 in the last 168 hours BNP (last 3 results) No results found for this basename: PROBNP:3 in the last 8760 hours CBG:  Lab 12/29/11 1118 12/29/11 0800  GLUCAP 101* 94    Recent Results (from the past 240 hour(s))  MRSA PCR SCREENING     Status: Normal   Collection Time   12/29/11  5:39 AM      Component Value Range Status Comment   MRSA by PCR NEGATIVE  NEGATIVE Final      Studies: Mr Lodema Pilot Contrast  Jan 17, 2012  *RADIOLOGY REPORT*  Clinical Data: 61 year old male with altered mental status.  MRI HEAD WITHOUT AND WITH CONTRAST  Technique:  Multiplanar, multiecho pulse sequences of the brain and surrounding structures were obtained according to standard protocol without and with intravenous contrast  Contrast: 15mL MULTIHANCE GADOBENATE DIMEGLUMINE 529 MG/ML IV SOLN  Comparison: Head CT without contrast 12/27/2011.  Findings: No  restricted diffusion to suggest acute infarction.  No midline shift, mass effect, evidence of mass lesion, ventriculomegaly, extra-axial collection or acute intracranial hemorrhage.  Cervicomedullary junction and pituitary are within normal limits.  Major intracranial vascular flow voids are preserved.  Scattered mostly small foci of T2 and FLAIR hyperintensity in the subcortical white matter.  The distribution is nonspecific.  No associated enhancement. No abnormal enhancement identified.  No cortical encephalomalacia identified.  Other gray and white matter signal within normal limits.  Negative visualized cervical spine.  Visualized bone marrow signal is within normal limits.  Visualized orbit soft tissues are within normal limits.  Scattered mild paranasal sinus mucosal thickening.  Mastoids are clear.  Negative scalp soft tissues.  IMPRESSION: 1. No acute intracranial abnormality. 2.  Mild to moderate for age nonspecific nonenhancing white matter signal changes.  Differential considerations include accelerated small vessel ischemia, sequelae of trauma, hypercoagulable state, vasculitis, migraines, prior infection or less likely demyelination.   Original Report Authenticated By: Erskine Speed, M.D.     Scheduled Meds:   . enoxaparin (LOVENOX) injection  40 mg Subcutaneous Q24H  . folic acid  1 mg Oral Daily  . multivitamin with minerals  1 tablet Oral Daily  . propranolol  20 mg Oral BID  . sodium chloride  3 mL Intravenous Q12H  . thiamine (VITAMIN B1) IVPB  500 mg Intravenous Q24H   Continuous Infusions:    Marinda Elk  [facility] Pager [phone]. If 8PM-8AM, please contact night-coverage at [email or web address], password [facility] 01/04/2012, 10:06 AM  LOS: 8 days

## 2012-01-05 MED ORDER — VITAMIN B-1 50 MG PO TABS
50.0000 mg | ORAL_TABLET | Freq: Every day | ORAL | Status: DC
  Administered 2012-01-05 – 2012-01-06 (×2): 50 mg via ORAL
  Filled 2012-01-05 (×3): qty 1

## 2012-01-05 MED ORDER — VITAMIN B-1 100 MG PO TABS: 100.0000 mg | ORAL_TABLET | Freq: Every day | ORAL | Status: DC

## 2012-01-05 NOTE — Progress Notes (Signed)
[facility] PROGRESS NOTE  Antonio Acosta OZH:[id number] DOB: [birth year] DOA: 12/27/2011 PCP: No primary provider on file.  Assessment/Plan: Alcohol withdrawal (12/27/2011): -CIWA 0 no further ativan.  - NO confusion/Delirium   -gait seems to be stable-walked him in his room  - MRI Brain 12.27.2013 -no acute abnormalities  -EEG 12.28.2013-no epileptiform wave   ETOH abuse (12/27/2011) - counseling.  - [facility].   LFT elevation (12/27/2011): - likely secondary to alcohol abuse.  -On admission his LFTs mildly elevated with 2-1 the increased by the second day after continue monitoring his LFTs started to trend down. He remained asymptomatic.   Hypokalemia (12/27/2011) - 2/2 to ETOH  -replete   Metabolic acidosis (12/27/2011) - likely multifactorial secondary to alcohol, salicylates and lactic acid. This was treated conservatively with IV fluids.    Code Status: full Family Communication:  Disposition Plan: [facility] on Monday   Consultants:    Procedures:    Antibiotics:    HPI/Subjective: Doing well, no further episodes of confusion  Objective: Filed Vitals:   01/04/12 1327 01/04/12 2213 01/04/12 2215 01/05/12 0512  BP: 111/70 145/81  137/74  Pulse: 79 84  76  Temp: 97.4 F (36.3 C)  98.5 F (36.9 C) 98 F (36.7 C)  TempSrc: Oral  Oral Oral  Resp: 17 16  18   Height:      Weight:      SpO2: 100% 100%  96%    Intake/Output Summary (Last 24 hours) at 01/05/12 0757 Last data filed at 01/05/12 0513  Gross per 24 hour  Intake   1200 ml  Output    400 ml  Net    800 ml   Filed Weights   12/27/11 2117  Weight: 72.1 kg (158 lb 15.2 oz)    Exam:  General: Alert, awake, oriented x3, in no acute distress.  HEENT: No bruits, no goiter.  Heart: Regular rate and rhythm, without murmurs, rubs, gallops.  Lungs: Good air movement, clear to auscultation  Abdomen: Soft, nontender, nondistended, positive bowel sounds.  Neuro:  Grossly intact, nonfocal.   Data Reviewed: Basic Metabolic Panel:  Lab 01/04/12 6295 01/03/12 0623 01/02/12 0730 01/01/12 0415 12/31/11 0500 12/29/11 1649  NA 133* 133* 135 132* 131* --  K 4.8 3.4* 3.2* 4.9 3.2* --  CL 96 96 96 95* 94* --  CO2 26 21 24 24 19  --  GLUCOSE 94 87 122* 103* 79 --  BUN 11 7 7 11 10  --  CREATININE 0.84 0.54 0.56 0.67 0.63 --  CALCIUM 9.9 9.6 9.7 9.4 9.4 --  MG 2.2 1.9 -- 2.0 1.6 1.8  PHOS -- 4.1 -- -- 3.8 --   Liver Function Tests:  Lab 01/04/12 0453 01/03/12 0623 12/29/11 0819  AST 198* 203* 139*  ALT 220* 206* 91*  ALKPHOS 84 87 90  BILITOT 1.1 1.3* 2.4*  PROT 7.5 7.6 8.0  ALBUMIN 3.7 3.8 4.0   No results found for this basename: LIPASE:5,AMYLASE:5 in the last 168 hours No results found for this basename: AMMONIA:5 in the last 168 hours CBC:  Lab 01/03/12 0623 01/02/12 0730 01/01/12 0415 12/31/11 0500  WBC 6.1 7.5 6.9 7.9  NEUTROABS -- -- -- --  HGB 12.5* 13.8 12.7* 13.0  HCT 36.3* 40.1 35.7* 38.4*  MCV 91.4 92.0 90.6 91.9  PLT 296 280 361 221   Cardiac Enzymes: No results found for this basename: CKTOTAL:5,CKMB:5,CKMBINDEX:5,TROPONINI:5 in the last 168 hours BNP (last 3 results) No results found for this basename:  PROBNP:3 in the last 8760 hours CBG:  Lab 12/29/11 1118 12/29/11 0800  GLUCAP 101* 94    Recent Results (from the past 240 hour(s))  MRSA PCR SCREENING     Status: Normal   Collection Time   12/29/11  5:39 AM      Component Value Range Status Comment   MRSA by PCR NEGATIVE  NEGATIVE Final      Studies: No results found.  Scheduled Meds:   . folic acid  1 mg Oral Daily  . multivitamin with minerals  1 tablet Oral Daily  . propranolol  20 mg Oral BID  . sodium chloride  3 mL Intravenous Q12H  . thiamine (VITAMIN B1) IVPB  500 mg Intravenous Q24H   Continuous Infusions:   Active Problems:  Alcohol withdrawal  ETOH abuse  LFT elevation  Hypokalemia  Metabolic acidosis  Delirium tremens    Time spent:  25    Antonio Acosta  [facility] Pager [phone] If 8PM-8AM, please contact night-coverage at [email or web address], password [facility] 01/05/2012, 7:57 AM  LOS: 9 days

## 2012-01-06 LAB — COMPREHENSIVE METABOLIC PANEL
BUN: 10 mg/dL (ref 6–23)
CO2: 25 mEq/L (ref 19–32)
Chloride: 96 mEq/L (ref 96–112)
Creatinine, Ser: 0.85 mg/dL (ref 0.50–1.35)
GFR calc Af Amer: >90 mL/min (ref >90)
GFR calc non Af Amer: >90 mL/min (ref >90)
Total Bilirubin: 0.9 mg/dL (ref 0.3–1.2)

## 2012-01-06 LAB — CBC
Platelets: 472 10*3/uL — ABNORMAL HIGH (ref 150–400)
RBC: 4.21 MIL/uL — ABNORMAL LOW (ref 4.22–5.81)
WBC: 6.9 10*3/uL (ref 4.0–10.5)

## 2012-01-06 MED ORDER — PROPRANOLOL HCL 20 MG PO TABS: 20.0000 mg | ORAL_TABLET | Freq: Two times a day (BID) | ORAL | Status: DC

## 2012-01-06 MED ORDER — ADULT MULTIVITAMIN W/MINERALS CH: 1.0000 | ORAL_TABLET | Freq: Every day | ORAL | Status: DC

## 2012-01-06 MED ORDER — THIAMINE HCL 50 MG PO TABS: 50.0000 mg | ORAL_TABLET | Freq: Every day | ORAL | Status: DC

## 2012-01-06 MED ORDER — FOLIC ACID 1 MG PO TABS: 1.0000 mg | ORAL_TABLET | Freq: Every day | ORAL | Status: DC

## 2012-01-06 NOTE — Progress Notes (Signed)
Clinical Social Work  CSW spoke with Harvie Heck at [facility] who is agreeable to admission today. [facility] requests that patient admit as soon as possible. CSW met with patient and wife in room and explained [facility]. CSW informed RN and text paged MD. All parties agreeable to dc. CSW is signing off but available if needed.  [place], [place] [phone]

## 2012-01-06 NOTE — Discharge Summary (Signed)
Physician Discharge Summary  Mukund Weinreb ZOX:[id number] DOB: 1950/07/26 DOA: 12/27/2011  PCP: No primary provider on file.  Admit date: 12/27/2011 Discharge date: 01/06/2012  Time spent: 35 minutes  Recommendations for Outpatient Follow-up:  1. CMP for Liver enzymes as an outpatient  Discharge Diagnoses:  Active Problems:  Alcohol withdrawal  ETOH abuse  LFT elevation  Hypokalemia  Metabolic acidosis  Delirium tremens   Discharge Condition: improved  Diet recommendation: cardiac  Filed Weights   12/27/11 2117  Weight: 72.1 kg (158 lb 15.2 oz)    History of present illness:  Antonio Acosta is a 61 year old white male, he has not seen a medical doctor or 5-6 years, his wife relates patient has been under a lot of stress for over 6 months now, related to the demise of his mother and job loss , he apparently has been drinking very heavily for the last 6 months, patient thinks it's more like 9 months . He admits drinking about 5-10 beers a day , yesterday he drank only one or 2 beers during the day , and this morning he went out with his wife who works with a gardening club and they were outside raking leaves and doing other work in the garden, she drove them back home and this afternoon around 1pm he seemed a little more anxious and started saying things that did not make sense, was tremulous and his wife noted that her speech was slightly slurred as well.  No history of fevers or chills, no weakness or numbness in any arm or leg, no recent trauma, no new medications.  Upon evaluation the emergency room he was noted to be tachycardic, hypertensive per EMS blood pressure of 230/130 initially, tremulous, and at one point in the emergency room room had an episode of seizure-like jerking movements but never quite lost consciousness per family, this was transient and resolved.    Hospital Course:  Alcohol withdrawal (12/27/2011): -CIWA 0 no further ativan.  - NO confusion/Delirium    -gait seems to be stable-walked him in his room  - MRI Brain 12.27.2013 -no acute abnormalities  -EEG 12.28.2013-no epileptiform wave   ETOH abuse (12/27/2011) - counseling.  - [facility]   LFT elevation (12/27/2011): - likely secondary to alcohol abuse.  -On admission his LFTs mildly elevated with 2-1 the increased by the second day after continue monitoring his LFTs started to trend down. He remained asymptomatic.   Hypokalemia (12/27/2011) - 2/2 to ETOH  -repleted  Metabolic acidosis (12/27/2011) - likely multifactorial secondary to alcohol, salicylates and lactic acid. This was treated conservatively with IV fluids.     Consultations:  Critical care  Discharge Exam: Filed Vitals:   01/04/12 2215 01/05/12 0512 01/05/12 1358 01/05/12 2011  BP:  137/74 154/87 118/80  Pulse:  76 95 86  Temp: 98.5 F (36.9 C) 98 F (36.7 C) 98.3 F (36.8 C) 98.2 F (36.8 C)  TempSrc: Oral Oral Oral Oral  Resp:  18 20 18   Height:      Weight:      SpO2:  96% 100% 98%    General: A+Ox3, NAd Cardiovascular: rrr Respiratory: clear anterior  Discharge Instructions  Discharge Orders    Future Orders Please Complete By Expires   Diet - low sodium heart healthy      Increase activity slowly      Discharge instructions      Comments:   Avoid alcohol       Medication List  As of 01/06/2012  7:57 AM    TAKE these medications         folic acid 1 MG tablet   Commonly known as: FOLVITE   Take 1 tablet (1 mg total) by mouth daily.      multivitamin with minerals Tabs   Take 1 tablet by mouth daily.      propranolol 20 MG tablet   Commonly known as: INDERAL   Take 1 tablet (20 mg total) by mouth 2 (two) times daily.      thiamine 50 MG tablet   Take 1 tablet (50 mg total) by mouth daily.      VISINE OP   Place 1 drop into both eyes daily as needed. For dry eyes           Follow-up Information    Please follow up. (will need PCP upon release  [facility])           The results of significant diagnostics from this hospitalization (including imaging, microbiology, ancillary and laboratory) are listed below for reference.    Significant Diagnostic Studies: Ct Head Wo Contrast  12/27/2011  *RADIOLOGY REPORT*  Clinical Data: Transiting episode of slurred speech and tremors.  CT HEAD WITHOUT CONTRAST  Technique:  Contiguous axial images were obtained from the base of the skull through the vertex without contrast.  Comparison: None.  Findings: No acute cortical infarct, hemorrhage, or mass lesion is present.  Mild periventricular and subcortical white matter hypoattenuation is noted.  Mild generalized atrophy is present as well.  The ventricles are of normal size.  No significant extra- axial fluid collection is present.  There is partial opacification of the anterior left ethmoid air cells.  The paranasal sinuses and mastoid air cells are otherwise clear.  The osseous skull is intact.  IMPRESSION:  1.  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 The finding is nonspecific but can be seen in the setting of chronic microvascular ischemia, a demyelinating process such as multiple sclerosis, vasculitis, complicated migraine headaches, or as the sequelae of a prior infectious or inflammatory process. 2.  Minimal ethmoid sinus disease.   Original Report Authenticated By: Marin Roberts, M.D.    Mr Laqueta Jean Wo Contrast  01/02/2012  *RADIOLOGY REPORT*  Clinical Data: 61 year old male with altered mental status.  MRI HEAD WITHOUT AND WITH CONTRAST  Technique:  Multiplanar, multiecho pulse sequences of the brain and surrounding structures were obtained according to standard protocol without and with intravenous contrast  Contrast: 15mL MULTIHANCE GADOBENATE DIMEGLUMINE 529 MG/ML IV SOLN  Comparison: Head CT without contrast 12/27/2011.  Findings: No restricted diffusion to suggest acute infarction.  No midline shift, mass  effect, evidence of mass lesion, ventriculomegaly, extra-axial collection or acute intracranial hemorrhage.  Cervicomedullary junction and pituitary are within normal limits.  Major intracranial vascular flow voids are preserved.  Scattered mostly small foci of T2 and FLAIR hyperintensity in the subcortical white matter.  The distribution is nonspecific.  No associated enhancement. No abnormal enhancement identified.  No cortical encephalomalacia identified.  Other gray and white matter signal within normal limits.  Negative visualized cervical spine.  Visualized bone marrow signal is within normal limits.  Visualized orbit soft tissues are within normal limits.  Scattered mild paranasal sinus mucosal thickening.  Mastoids are clear.  Negative scalp soft tissues.  IMPRESSION: 1. No acute intracranial abnormality. 2.  Mild to moderate for age nonspecific nonenhancing white matter signal changes.  Differential considerations include accelerated  small vessel ischemia, sequelae of trauma, hypercoagulable state, vasculitis, migraines, prior infection or less likely demyelination.   Original Report Authenticated By: Erskine Speed, M.D.     Microbiology: Recent Results (from the past 240 hour(s))  MRSA PCR SCREENING     Status: Normal   Collection Time   12/29/11  5:39 AM      Component Value Range Status Comment   MRSA by PCR NEGATIVE  NEGATIVE Final      Labs: Basic Metabolic Panel:  Lab 01/04/12 1610 01/03/12 0623 01/02/12 0730 01/01/12 0415 12/31/11 0500  NA 133* 133* 135 132* 131*  K 4.8 3.4* 3.2* 4.9 3.2*  CL 96 96 96 95* 94*  CO2 26 21 24 24 19   GLUCOSE 94 87 122* 103* 79  BUN 11 7 7 11 10   CREATININE 0.84 0.54 0.56 0.67 0.63  CALCIUM 9.9 9.6 9.7 9.4 9.4  MG 2.2 1.9 -- 2.0 1.6  PHOS -- 4.1 -- -- 3.8   Liver Function Tests:  Lab 01/04/12 0453 01/03/12 0623  AST 198* 203*  ALT 220* 206*  ALKPHOS 84 87  BILITOT 1.1 1.3*  PROT 7.5 7.6  ALBUMIN 3.7 3.8   No results found for this  basename: LIPASE:5,AMYLASE:5 in the last 168 hours No results found for this basename: AMMONIA:5 in the last 168 hours CBC:  Lab 01/03/12 0623 01/02/12 0730 01/01/12 0415 12/31/11 0500  WBC 6.1 7.5 6.9 7.9  NEUTROABS -- -- -- --  HGB 12.5* 13.8 12.7* 13.0  HCT 36.3* 40.1 35.7* 38.4*  MCV 91.4 92.0 90.6 91.9  PLT 296 280 361 221   Cardiac Enzymes: No results found for this basename: CKTOTAL:5,CKMB:5,CKMBINDEX:5,TROPONINI:5 in the last 168 hours BNP: BNP (last 3 results) No results found for this basename: PROBNP:3 in the last 8760 hours CBG: No results found for this basename: GLUCAP:5 in the last 168 hours     Signed:  [name]  [facility] 01/06/2012, 7:57 AM

## 2012-01-06 NOTE — Progress Notes (Signed)
Previous nurse called doctor to confirm pt's request of not being bother throughout the night. Sign was placed on pt's door to hold all vitals and labs till the morning. Pt is to be d/c today and is doing very well. He does all his ADL's and asked that he is able to get a good night sleep before the morning. Thanks.

## 2012-01-06 NOTE — Progress Notes (Signed)
Clinical Social Work  CSW faxed clinicals to [facility] regarding patient admission today. CSW spoke with Harvie Heck who confirmed they received fax and information is being reviewed. [facility] will contact CSW after reviewing information or if further information is needed.  CSW will continue to follow and will update patient and family once speaking with [facility].  [place], [place] [phone]

## 2012-01-06 NOTE — Care Management Note (Signed)
    Page 1 of 1   01/06/2012     10:54:34 AM   CARE MANAGEMENT NOTE 01/06/2012  Patient:  Antonio Acosta, Antonio Acosta   Account Number:  [id number]  Date Initiated:  12/31/2011  Documentation initiated by:  Avie Arenas  Subjective/Objective Assessment:   ETOH withdrawal  Has spouse.     Action/Plan:   dc to [facility]- psych csw following.l   Anticipated DC Date:  01/06/2012   Anticipated DC Plan:  HOME/SELF CARE  In-house referral  Clinical Social Worker      DC Planning Services  CM consult      Choice offered to / List presented to:             Status of service:  Completed, signed off Medicare Important Message given?   (If response is "NO", the following Medicare IM given date fields will be blank) Date Medicare IM given:   Date Additional Medicare IM given:    Discharge Disposition:  HOME/SELF CARE  Per UR Regulation:  Reviewed for med. necessity/level of care/duration of stay  If discussed at Long Length of Stay Meetings, dates discussed:    Comments:  Contact:  [place],Terrylene Spouse [phone]  01/06/12 10:53 Letha Cape RN, BSN [phone] patient is for [facility] today, physch CSW following.

## 2012-01-06 NOTE — Progress Notes (Signed)
Pt. discharged to floor,verbalized understanding of discharged instruction,medication,restriction,diet and follow up appointment.Baseline Vitals sign stable,Pt comfortable,no sign and symptom of distress. 

## 2012-04-13 ENCOUNTER — Ambulatory Visit (INDEPENDENT_AMBULATORY_CARE_PROVIDER_SITE_OTHER): Payer: BC Managed Care – PPO | Admitting: Family Medicine

## 2012-04-13 VITALS — BP 126/76 | HR 70 | Temp 99.2°F | Resp 16 | Ht 71.0 in | Wt 170.0 lb

## 2012-04-13 DIAGNOSIS — I10 Essential (primary) hypertension: Secondary | ICD-10-CM

## 2012-04-13 MED ORDER — METOPROLOL TARTRATE 50 MG PO TABS: 50.0000 mg | ORAL_TABLET | Freq: Two times a day (BID) | ORAL | Status: DC

## 2012-04-13 NOTE — Progress Notes (Signed)
[facility] [address] [place] [address] [phone]  Date:  04/13/2012   Name:  Antonio Acosta   DOB:  [birth year]   MRN:  [id number]  PCP:  No primary provider on file.    Chief Complaint: Hypertension   History of Present Illness:  Antonio Acosta is a 62 y.o. very pleasant male patient who presents with the following:  He is taking metoprolol for his BP.  He has been taking this for about 4 months. He does not check his BP at home so is not sure how it has been running.  He was recently in [facility] for [facility] abuse, but has been home and clean now for 180 days.   He has no other concern today except he does need a refill of his metoprolol    Patient Active Problem List  Diagnosis  . Alcohol withdrawal  . ETOH abuse  . LFT elevation  . Hypokalemia  . Metabolic acidosis  . Delirium tremens    Past Medical History  Diagnosis Date  . Hypertension     History reviewed. No pertinent past surgical history.  History  Substance Use Topics  . Smoking status: Never Smoker   . Smokeless tobacco: Not on file  . Alcohol Use: 21.0 oz/week    35 Cans of beer per week     Comment: 4-5 a day    Family History  Problem Relation Age of Onset  . Hypertension Father   . Cancer Sister     No Known Allergies  Medication list has been reviewed and updated.  Current Outpatient Prescriptions on File Prior to Visit  Medication Sig Dispense Refill  . Multiple Vitamin (MULTIVITAMIN WITH MINERALS) TABS Take 1 tablet by mouth daily.      . folic acid (FOLVITE) 1 MG tablet Take 1 tablet (1 mg total) by mouth daily.  30 tablet  0  . propranolol (INDERAL) 20 MG tablet Take 1 tablet (20 mg total) by mouth 2 (two) times daily.  60 tablet  0  . Tetrahydrozoline HCl (VISINE OP) Place 1 drop into both eyes daily as needed. For dry eyes      . thiamine 50 MG tablet Take 1 tablet (50 mg total) by mouth daily.  30 tablet  0   No current facility-administered  medications on file prior to visit.    Review of Systems:  As per HPI- otherwise negative.   Physical Examination: Filed Vitals:   04/13/12 1050  BP: 126/76  Pulse: 70  Temp: 99.2 F (37.3 C)  Resp: 16   Filed Vitals:   04/13/12 1050  Height: 5\' 11"  (1.803 m)  Weight: 170 lb (77.111 kg)   Body mass index is 23.72 kg/(m^2). Ideal Body Weight: Weight in (lb) to have BMI = 25: 178.9  GEN: WDWN, NAD, Non-toxic, A & O x 3, looks well, no tremulousness or other sign of etoh abuse HEENT: Atraumatic, Normocephalic. Neck supple. No masses, No LAD. Ears and Nose: No external deformity. CV: RRR, No M/G/R. No JVD. No thrill. No extra heart sounds. PULM: CTA B, no wheezes, crackles, rhonchi. No retractions. No resp. distress. No accessory muscle use. EXTR: No c/c/e NEURO Normal gait.  PSYCH: Normally interactive. Conversant. Not depressed or anxious appearing.  Calm demeanor.    Assessment and Plan: HTN (hypertension) - Plan: metoprolol (LOPRESSOR) 50 MG tablet  Refilled BP medication for Adil today. He has an appt pending with the doctor who will be his  new PCP next month.   Signed Abbe Amsterdam, MD

## 2012-04-13 NOTE — Patient Instructions (Addendum)
Continue to take your BP medication as directed, let us know if we can do anything to help.

## 2012-04-15 ENCOUNTER — Encounter (INDEPENDENT_AMBULATORY_CARE_PROVIDER_SITE_OTHER): Payer: BC Managed Care – PPO | Admitting: Ophthalmology

## 2012-04-15 DIAGNOSIS — H35039 Hypertensive retinopathy, unspecified eye: Secondary | ICD-10-CM

## 2012-04-15 DIAGNOSIS — H43819 Vitreous degeneration, unspecified eye: Secondary | ICD-10-CM

## 2012-04-15 DIAGNOSIS — H2510 Age-related nuclear cataract, unspecified eye: Secondary | ICD-10-CM

## 2012-04-15 DIAGNOSIS — I10 Essential (primary) hypertension: Secondary | ICD-10-CM

## 2012-04-15 DIAGNOSIS — H348392 Tributary (branch) retinal vein occlusion, unspecified eye, stable: Secondary | ICD-10-CM

## 2012-04-15 DIAGNOSIS — D3130 Benign neoplasm of unspecified choroid: Secondary | ICD-10-CM

## 2012-04-15 DIAGNOSIS — H33309 Unspecified retinal break, unspecified eye: Secondary | ICD-10-CM

## 2012-04-29 ENCOUNTER — Ambulatory Visit (INDEPENDENT_AMBULATORY_CARE_PROVIDER_SITE_OTHER): Payer: BC Managed Care – PPO | Admitting: Ophthalmology

## 2012-04-29 DIAGNOSIS — H33309 Unspecified retinal break, unspecified eye: Secondary | ICD-10-CM

## 2012-04-29 DIAGNOSIS — H348392 Tributary (branch) retinal vein occlusion, unspecified eye, stable: Secondary | ICD-10-CM

## 2012-05-11 ENCOUNTER — Encounter: Payer: Self-pay | Admitting: Internal Medicine

## 2012-05-11 ENCOUNTER — Ambulatory Visit (INDEPENDENT_AMBULATORY_CARE_PROVIDER_SITE_OTHER): Payer: No Typology Code available for payment source | Admitting: Internal Medicine

## 2012-05-11 VITALS — BP 120/78 | HR 72 | Temp 97.7°F | Resp 16 | Ht 72.0 in | Wt 175.5 lb

## 2012-05-11 DIAGNOSIS — I10 Essential (primary) hypertension: Secondary | ICD-10-CM

## 2012-05-11 DIAGNOSIS — F1011 Alcohol abuse, in remission: Secondary | ICD-10-CM

## 2012-05-11 NOTE — Assessment & Plan Note (Signed)
He feels like lopressor causes lightheadedness and low libido but he does not want to change his meds for now His BP is well controlled

## 2012-05-11 NOTE — Patient Instructions (Signed)

## 2012-05-11 NOTE — Assessment & Plan Note (Signed)
He did a 28 day program at [facility] and has been abstinent for 5 months now

## 2012-05-11 NOTE — Progress Notes (Signed)
  Subjective:    Patient ID: Antonio Acosta, male    DOB: [birth year], 61 y.o.   MRN: [id number]  Hypertension This is a chronic problem. The current episode started more than 1 year ago. The problem is unchanged. The problem is controlled. Pertinent negatives include no anxiety, blurred vision, chest pain, headaches, malaise/fatigue, neck pain, orthopnea, palpitations, peripheral edema, PND, shortness of breath or sweats. Past treatments include beta blockers. The current treatment provides significant improvement.      Review of Systems  Constitutional: Negative.  Negative for fever, chills, malaise/fatigue, diaphoresis, activity change, appetite change, fatigue and unexpected weight change.  HENT: Negative.  Negative for neck pain.   Eyes: Negative.  Negative for blurred vision.  Respiratory: Negative.  Negative for cough, chest tightness, shortness of breath, wheezing and stridor.   Cardiovascular: Negative.  Negative for chest pain, palpitations, orthopnea, leg swelling and PND.  Gastrointestinal: Negative for nausea, vomiting, abdominal pain, diarrhea, constipation and blood in stool.  Endocrine: Negative.   Genitourinary: Negative.   Musculoskeletal: Negative.   Skin: Negative.  Negative for color change, pallor, rash and wound.  Allergic/Immunologic: Negative.   Neurological: Positive for light-headedness (orthostatic lightheadedness). Negative for dizziness, tremors, seizures, syncope, facial asymmetry, speech difficulty, weakness, numbness and headaches.  Hematological: Negative.  Negative for adenopathy. Does not bruise/bleed easily.  Psychiatric/Behavioral: Negative.        Objective:   Physical Exam  Vitals reviewed. Constitutional: He is oriented to person, place, and time. He appears well-developed and well-nourished. No distress.  HENT:  Head: Normocephalic and atraumatic.  Mouth/Throat: Oropharynx is clear and moist. No oropharyngeal exudate.  Eyes: Conjunctivae are  normal. Right eye exhibits no discharge. Left eye exhibits no discharge. No scleral icterus.  Neck: Normal range of motion. Neck supple. No JVD present. No tracheal deviation present. No thyromegaly present.  Cardiovascular: Normal rate, regular rhythm, normal heart sounds and intact distal pulses.  Exam reveals no gallop and no friction rub.   No murmur heard. Pulmonary/Chest: Effort normal and breath sounds normal. No stridor. No respiratory distress. He has no wheezes. He has no rales. He exhibits no tenderness.  Abdominal: Soft. Bowel sounds are normal. He exhibits no distension and no mass. There is no tenderness. There is no rebound and no guarding.  Musculoskeletal: Normal range of motion. He exhibits no edema and no tenderness.  Lymphadenopathy:    He has no cervical adenopathy.  Neurological: He is oriented to person, place, and time.  Skin: Skin is warm and dry. No rash noted. He is not diaphoretic. No erythema. No pallor.  Psychiatric: He has a normal mood and affect. His behavior is normal. Judgment and thought content normal.     Lab Results  Component Value Date   WBC 6.9 01/06/2012   HGB 12.6* 01/06/2012   HCT 39.0 01/06/2012   PLT 472* 01/06/2012   GLUCOSE 114* 01/06/2012   ALT 269* 01/06/2012   AST 221* 01/06/2012   NA 134* 01/06/2012   K 4.5 01/06/2012   CL 96 01/06/2012   CREATININE 0.85 01/06/2012   BUN 10 01/06/2012   CO2 25 01/06/2012   INR 0.94 12/28/2011   HGBA1C 5.5 12/27/2011       Assessment & Plan:

## 2012-08-31 ENCOUNTER — Ambulatory Visit (INDEPENDENT_AMBULATORY_CARE_PROVIDER_SITE_OTHER): Payer: No Typology Code available for payment source | Admitting: Ophthalmology

## 2012-08-31 DIAGNOSIS — D3130 Benign neoplasm of unspecified choroid: Secondary | ICD-10-CM

## 2012-08-31 DIAGNOSIS — H33309 Unspecified retinal break, unspecified eye: Secondary | ICD-10-CM

## 2012-08-31 DIAGNOSIS — H35039 Hypertensive retinopathy, unspecified eye: Secondary | ICD-10-CM

## 2012-08-31 DIAGNOSIS — H348392 Tributary (branch) retinal vein occlusion, unspecified eye, stable: Secondary | ICD-10-CM

## 2012-08-31 DIAGNOSIS — I10 Essential (primary) hypertension: Secondary | ICD-10-CM

## 2012-09-11 ENCOUNTER — Encounter: Payer: Self-pay | Admitting: Internal Medicine

## 2012-09-11 ENCOUNTER — Other Ambulatory Visit: Payer: No Typology Code available for payment source

## 2012-09-11 ENCOUNTER — Other Ambulatory Visit (INDEPENDENT_AMBULATORY_CARE_PROVIDER_SITE_OTHER): Payer: No Typology Code available for payment source

## 2012-09-11 ENCOUNTER — Ambulatory Visit (INDEPENDENT_AMBULATORY_CARE_PROVIDER_SITE_OTHER): Payer: No Typology Code available for payment source | Admitting: Internal Medicine

## 2012-09-11 VITALS — BP 118/80 | HR 70 | Temp 98.4°F | Resp 16 | Ht 72.0 in | Wt 183.0 lb

## 2012-09-11 DIAGNOSIS — R7401 Elevation of levels of liver transaminase levels: Secondary | ICD-10-CM

## 2012-09-11 DIAGNOSIS — I10 Essential (primary) hypertension: Secondary | ICD-10-CM

## 2012-09-11 DIAGNOSIS — Z0000 Encounter for general adult medical examination without abnormal findings: Secondary | ICD-10-CM

## 2012-09-11 DIAGNOSIS — R7309 Other abnormal glucose: Secondary | ICD-10-CM

## 2012-09-11 DIAGNOSIS — D649 Anemia, unspecified: Secondary | ICD-10-CM | POA: Insufficient documentation

## 2012-09-11 DIAGNOSIS — R7303 Prediabetes: Secondary | ICD-10-CM | POA: Insufficient documentation

## 2012-09-11 DIAGNOSIS — Z23 Encounter for immunization: Secondary | ICD-10-CM

## 2012-09-11 LAB — COMPREHENSIVE METABOLIC PANEL
ALT: 22 U/L (ref 0–53)
AST: 26 U/L (ref 0–37)
Albumin: 4.5 g/dL (ref 3.5–5.2)
Alkaline Phosphatase: 93 U/L (ref 39–117)
BUN: 14 mg/dL (ref 6–23)
CO2: 30 mEq/L (ref 19–32)
Calcium: 9.5 mg/dL (ref 8.4–10.5)
Chloride: 101 mEq/L (ref 96–112)
Creatinine, Ser: 0.9 mg/dL (ref 0.4–1.5)
GFR: 92.65 mL/min (ref >60.00)
Glucose, Bld: 66 mg/dL — ABNORMAL LOW (ref 70–99)
Potassium: 4.3 mEq/L (ref 3.5–5.1)
Sodium: 137 mEq/L (ref 135–145)
Total Bilirubin: 1.2 mg/dL (ref 0.3–1.2)
Total Protein: 7.5 g/dL (ref 6.0–8.3)

## 2012-09-11 LAB — CBC WITH DIFFERENTIAL/PLATELET
Basophils Relative: 0.5 % (ref 0.0–3.0)
Eosinophils Relative: 3.5 % (ref 0.0–5.0)
Hemoglobin: 13.7 g/dL (ref 13.0–17.0)
Lymphocytes Relative: 27.8 % (ref 12.0–46.0)
MCHC: 33.2 g/dL (ref 30.0–36.0)
Monocytes Relative: 8.9 % (ref 3.0–12.0)
Neutro Abs: 4.9 10*3/uL (ref 1.4–7.7)
Neutrophils Relative %: 59.3 % (ref 43.0–77.0)
RBC: 4.87 Mil/uL (ref 4.22–5.81)
WBC: 8.3 10*3/uL (ref 4.5–10.5)

## 2012-09-11 LAB — LDL CHOLESTEROL, DIRECT: Direct LDL: 137.6 mg/dL

## 2012-09-11 LAB — LIPID PANEL
Cholesterol: 222 mg/dL — ABNORMAL HIGH (ref 0–200)
HDL: 64.1 mg/dL (ref >39.00)
Total CHOL/HDL Ratio: 3
Triglycerides: 178 mg/dL — ABNORMAL HIGH (ref 0.0–149.0)
VLDL: 35.6 mg/dL (ref 0.0–40.0)

## 2012-09-11 LAB — TSH: TSH: 2.79 u[IU]/mL (ref 0.35–5.50)

## 2012-09-11 LAB — HEMOGLOBIN A1C: Hgb A1c MFr Bld: 6 % (ref 4.6–6.5)

## 2012-09-11 LAB — PSA: PSA: 1.19 ng/mL (ref 0.10–4.00)

## 2012-09-11 NOTE — Progress Notes (Signed)
Subjective:    Patient ID: Antonio Acosta, male    DOB: 11/16/1950, 62 y.o.   MRN: [id number]  Hypertension This is a chronic problem. The current episode started more than 1 year ago. The problem has been gradually improving since onset. The problem is controlled. Pertinent negatives include no anxiety, blurred vision, chest pain, headaches, malaise/fatigue, neck pain, orthopnea, palpitations, peripheral edema, PND, shortness of breath or sweats. There are no associated agents to hypertension. Past treatments include beta blockers. The current treatment provides significant improvement. There are no compliance problems.       Review of Systems  Constitutional: Negative.  Negative for malaise/fatigue.  HENT: Negative.  Negative for neck pain.   Eyes: Negative.  Negative for blurred vision.  Respiratory: Negative.  Negative for apnea, choking, chest tightness, shortness of breath, wheezing and stridor.   Cardiovascular: Negative.  Negative for chest pain, palpitations, orthopnea, leg swelling and PND.  Gastrointestinal: Negative.  Negative for nausea, vomiting, abdominal pain, diarrhea, constipation and rectal pain.  Endocrine: Negative.   Genitourinary: Negative.  Negative for difficulty urinating.  Musculoskeletal: Negative.  Negative for myalgias, back pain, joint swelling, arthralgias and gait problem.  Skin: Negative.   Allergic/Immunologic: Negative.   Neurological: Negative.  Negative for dizziness, tremors, weakness, light-headedness and headaches.  Hematological: Negative.  Negative for adenopathy. Does not bruise/bleed easily.  Psychiatric/Behavioral: Negative.        Objective:   Physical Exam  Vitals reviewed. Constitutional: He is oriented to person, place, and time. He appears well-developed and well-nourished. No distress.  HENT:  Head: Normocephalic and atraumatic.  Mouth/Throat: Oropharynx is clear and moist. No oropharyngeal exudate.  Eyes: Conjunctivae are normal.  Right eye exhibits no discharge. Left eye exhibits no discharge. No scleral icterus.  Neck: Normal range of motion. Neck supple. No JVD present. No tracheal deviation present. No thyromegaly present.  Cardiovascular: Normal rate, regular rhythm, normal heart sounds and intact distal pulses.  Exam reveals no gallop and no friction rub.   No murmur heard. Pulmonary/Chest: Effort normal and breath sounds normal. No stridor. No respiratory distress. He has no wheezes. He has no rales. He exhibits no tenderness.  Abdominal: Soft. Bowel sounds are normal. He exhibits no distension and no mass. There is no tenderness. There is no rebound and no guarding. Hernia confirmed negative in the right inguinal area and confirmed negative in the left inguinal area.  Genitourinary: Rectum normal, prostate normal, testes normal and penis normal. Rectal exam shows no external hemorrhoid, no internal hemorrhoid, no fissure, no mass, no tenderness and anal tone normal. Guaiac negative stool. Prostate is not enlarged and not tender. Right testis shows no mass, no swelling and no tenderness. Right testis is descended. Left testis shows no mass, no swelling and no tenderness. Left testis is descended. Circumcised. No penile erythema or penile tenderness. No discharge found.  Musculoskeletal: Normal range of motion. He exhibits no edema and no tenderness.  Lymphadenopathy:    He has no cervical adenopathy.       Right: No inguinal adenopathy present.       Left: No inguinal adenopathy present.  Neurological: He is oriented to person, place, and time.  Skin: Skin is warm and dry. No rash noted. He is not diaphoretic. No erythema. No pallor.  Psychiatric: He has a normal mood and affect. His behavior is normal. Judgment and thought content normal.     Lab Results  Component Value Date   WBC 6.9 01/06/2012   HGB 12.6* 01/06/2012  HCT 39.0 01/06/2012   PLT 472* 01/06/2012   GLUCOSE 114* 01/06/2012   ALT 269* 01/06/2012    AST 221* 01/06/2012   NA 134* 01/06/2012   K 4.5 01/06/2012   CL 96 01/06/2012   CREATININE 0.85 01/06/2012   BUN 10 01/06/2012   CO2 25 01/06/2012   INR 0.94 12/28/2011   HGBA1C 5.5 12/27/2011       Assessment & Plan:

## 2012-09-11 NOTE — Patient Instructions (Signed)
Hypertension As your heart beats, it forces blood through your arteries. This force is your blood pressure. If the pressure is too high, it is called hypertension (HTN) or high blood pressure. HTN is dangerous because you may have it and not know it. High blood pressure may mean that your heart has to work harder to pump blood. Your arteries may be narrow or stiff. The extra work puts you at risk for heart disease, stroke, and other problems.  Blood pressure consists of two numbers, a higher number over a lower, 110/72, for example. It is stated as "110 over 72." The ideal is below 120 for the top number (systolic) and under 80 for the bottom (diastolic). Write down your blood pressure today. You should pay close attention to your blood pressure if you have certain conditions such as:  Heart failure.  Prior heart attack.  Diabetes  Chronic kidney disease.  Prior stroke.  Multiple risk factors for heart disease. To see if you have HTN, your blood pressure should be measured while you are seated with your arm held at the level of the heart. It should be measured at least twice. A one-time elevated blood pressure reading (especially in the Emergency Department) does not mean that you need treatment. There may be conditions in which the blood pressure is different between your right and left arms. It is important to see your caregiver soon for a recheck. Most people have essential hypertension which means that there is not a specific cause. This type of high blood pressure may be lowered by changing lifestyle factors such as:  Stress.  Smoking.  Lack of exercise.  Excessive weight.  Drug/tobacco/alcohol use.  Eating less salt. Most people do not have symptoms from high blood pressure until it has caused damage to the body. Effective treatment can often prevent, delay or reduce that damage. TREATMENT  When a cause has been identified, treatment for high blood pressure is directed at the  cause. There are a large number of medications to treat HTN. These fall into several categories, and your caregiver will help you select the medicines that are best for you. Medications may have side effects. You should review side effects with your caregiver. If your blood pressure stays high after you have made lifestyle changes or started on medicines,   Your medication(s) may need to be changed.  Other problems may need to be addressed.  Be certain you understand your prescriptions, and know how and when to take your medicine.  Be sure to follow up with your caregiver within the time frame advised (usually within two weeks) to have your blood pressure rechecked and to review your medications.  If you are taking more than one medicine to lower your blood pressure, make sure you know how and at what times they should be taken. Taking two medicines at the same time can result in blood pressure that is too low. SEEK IMMEDIATE MEDICAL CARE IF:  You develop a severe headache, blurred or changing vision, or confusion.  You have unusual weakness or numbness, or a faint feeling.  You have severe chest or abdominal pain, vomiting, or breathing problems. MAKE SURE YOU:   Understand these instructions.  Will watch your condition.  Will get help right away if you are not doing well or get worse. Document Released: 12/24/2004 Document Revised: 03/18/2011 Document Reviewed: 08/14/2007 ExitCare Patient Information 2014 [employer]. Health Maintenance, Males A healthy lifestyle and preventative care can promote health and wellness.  Maintain   regular health, dental, and eye exams.  Eat a healthy diet. Foods like vegetables, fruits, whole grains, low-fat dairy products, and lean protein foods contain the nutrients you need without too many calories. Decrease your intake of foods high in solid fats, added sugars, and salt. Get information about a proper diet from your caregiver, if  necessary.  Regular physical exercise is one of the most important things you can do for your health. Most adults should get at least 150 minutes of moderate-intensity exercise (any activity that increases your heart rate and causes you to sweat) each week. In addition, most adults need muscle-strengthening exercises on 2 or more days a week.   Maintain a healthy weight. The body mass index (BMI) is a screening tool to identify possible weight problems. It provides an estimate of body fat based on height and weight. Your caregiver can help determine your BMI, and can help you achieve or maintain a healthy weight. For adults 20 years and older:  A BMI below 18.5 is considered underweight.  A BMI of 18.5 to 24.9 is normal.  A BMI of 25 to 29.9 is considered overweight.  A BMI of 30 and above is considered obese.  Maintain normal blood lipids and cholesterol by exercising and minimizing your intake of saturated fat. Eat a balanced diet with plenty of fruits and vegetables. Blood tests for lipids and cholesterol should begin at age 20 and be repeated every 5 years. If your lipid or cholesterol levels are high, you are over 50, or you are a high risk for heart disease, you may need your cholesterol levels checked more frequently.Ongoing high lipid and cholesterol levels should be treated with medicines, if diet and exercise are not effective.  If you smoke, find out from your caregiver how to quit. If you do not use tobacco, do not start.  If you choose to drink alcohol, do not exceed 2 drinks per day. One drink is considered to be 12 ounces (355 mL) of beer, 5 ounces (148 mL) of wine, or 1.5 ounces (44 mL) of liquor.  Avoid use of street drugs. Do not share needles with anyone. Ask for help if you need support or instructions about stopping the use of drugs.  High blood pressure causes heart disease and increases the risk of stroke. Blood pressure should be checked at least every 1 to 2 years.  Ongoing high blood pressure should be treated with medicines if weight loss and exercise are not effective.  If you are 45 to 60 years old, ask your caregiver if you should take aspirin to prevent heart disease.  Diabetes screening involves taking a blood sample to check your fasting blood sugar level. This should be done once every 3 years, after age 45, if you are within normal weight and without risk factors for diabetes. Testing should be considered at a younger age or be carried out more frequently if you are overweight and have at least 1 risk factor for diabetes.  Colorectal cancer can be detected and often prevented. Most routine colorectal cancer screening begins at the age of 50 and continues through age 75. However, your caregiver may recommend screening at an earlier age if you have risk factors for colon cancer. On a yearly basis, your caregiver may provide home test kits to check for hidden blood in the stool. Use of a small camera at the end of a tube, to directly examine the colon (sigmoidoscopy or colonoscopy), can detect the earliest forms of colorectal   cancer. Talk to your caregiver about this at age 50, when routine screening begins. Direct examination of the colon should be repeated every 5 to 10 years through age 75, unless early forms of pre-cancerous polyps or small growths are found.  Hepatitis C blood testing is recommended for all people born from 1945 through 1965 and any individual with known risks for hepatitis C.  Healthy men should no longer receive prostate-specific antigen (PSA) blood tests as part of routine cancer screening. Consult with your caregiver about prostate cancer screening.  Testicular cancer screening is not recommended for adolescents or adult males who have no symptoms. Screening includes self-exam, caregiver exam, and other screening tests. Consult with your caregiver about any symptoms you have or any concerns you have about testicular  cancer.  Practice safe sex. Use condoms and avoid high-risk sexual practices to reduce the spread of sexually transmitted infections (STIs).  Use sunscreen with a sun protection factor (SPF) of 30 or greater. Apply sunscreen liberally and repeatedly throughout the day. You should seek shade when your shadow is shorter than you. Protect yourself by wearing long sleeves, pants, a wide-brimmed hat, and sunglasses year round, whenever you are outdoors.  Notify your caregiver of new moles or changes in moles, especially if there is a change in shape or color. Also notify your caregiver if a mole is larger than the size of a pencil eraser.  A one-time screening for abdominal aortic aneurysm (AAA) and surgical repair of large AAAs by sound wave imaging (ultrasonography) is recommended for ages 65 to 75 years who are current or former smokers.  Stay current with your immunizations. Document Released: 06/22/2007 Document Revised: 03/18/2011 Document Reviewed: 05/21/2010 Exit[employer] Patient Information 2014 [employer].  

## 2012-09-12 LAB — HEPATITIS C ANTIBODY: HCV Ab: NEGATIVE

## 2012-09-12 LAB — HEPATITIS B SURFACE ANTIBODY,QUALITATIVE: Hep B S Ab: NEGATIVE

## 2012-09-13 ENCOUNTER — Encounter: Payer: Self-pay | Admitting: Internal Medicine

## 2012-09-13 NOTE — Assessment & Plan Note (Signed)
Exam done Labs ordered Vaccines were reviewed Pt ed material was given 

## 2012-09-13 NOTE — Assessment & Plan Note (Signed)
The LFT's are normal now - this was related to the EtOH Hep panel is neg., I have asked him to RTC for Hep A/B vaccines

## 2012-09-13 NOTE — Assessment & Plan Note (Signed)
I will check his A1C to see if he has developed DM2 

## 2012-09-13 NOTE — Assessment & Plan Note (Signed)
Resolved since EtOH was stopped

## 2012-09-13 NOTE — Assessment & Plan Note (Signed)
His BP is well controlled 

## 2012-11-03 ENCOUNTER — Encounter: Payer: Self-pay | Admitting: Internal Medicine

## 2012-11-27 ENCOUNTER — Other Ambulatory Visit: Payer: Self-pay | Admitting: Family Medicine

## 2012-12-14 ENCOUNTER — Ambulatory Visit (AMBULATORY_SURGERY_CENTER): Payer: Self-pay

## 2012-12-14 VITALS — Ht 72.0 in | Wt 184.0 lb

## 2012-12-14 DIAGNOSIS — Z1211 Encounter for screening for malignant neoplasm of colon: Secondary | ICD-10-CM

## 2012-12-14 MED ORDER — MOVIPREP 100 G PO SOLR: 1.0000 | Freq: Once | ORAL | Status: DC

## 2012-12-16 ENCOUNTER — Encounter: Payer: Self-pay | Admitting: Internal Medicine

## 2012-12-24 ENCOUNTER — Encounter: Payer: Self-pay | Admitting: Internal Medicine

## 2012-12-24 ENCOUNTER — Ambulatory Visit (AMBULATORY_SURGERY_CENTER): Payer: No Typology Code available for payment source | Admitting: Internal Medicine

## 2012-12-24 VITALS — BP 104/64 | HR 62 | Temp 98.5°F | Resp 14 | Ht 72.0 in | Wt 184.0 lb

## 2012-12-24 DIAGNOSIS — D126 Benign neoplasm of colon, unspecified: Secondary | ICD-10-CM

## 2012-12-24 DIAGNOSIS — Z1211 Encounter for screening for malignant neoplasm of colon: Secondary | ICD-10-CM

## 2012-12-24 HISTORY — PX: COLONOSCOPY: SHX174

## 2012-12-24 MED ORDER — SODIUM CHLORIDE 0.9 % IV SOLN: 500.0000 mL | INTRAVENOUS | Status: DC

## 2012-12-24 NOTE — Patient Instructions (Signed)
Discharge instructions given with verbal understanding. Handouts on polyps and diverticulosis. Resume previous medications. YOU HAD AN ENDOSCOPIC PROCEDURE TODAY AT THE [facility]: Refer to the procedure report that was given to you for any specific questions about what was found during the examination.  If the procedure report does not answer your questions, please call your gastroenterologist to clarify.  If you requested that your care partner not be given the details of your procedure findings, then the procedure report has been included in a sealed envelope for you to review at your convenience later.  YOU SHOULD EXPECT: Some feelings of bloating in the abdomen. Passage of more gas than usual.  Walking can help get rid of the air that was put into your GI tract during the procedure and reduce the bloating. If you had a lower endoscopy (such as a colonoscopy or flexible sigmoidoscopy) you may notice spotting of blood in your stool or on the toilet paper. If you underwent a bowel prep for your procedure, then you may not have a normal bowel movement for a few days.  DIET: Your first meal following the procedure should be a light meal and then it is ok to progress to your normal diet.  A half-sandwich or bowl of soup is an example of a good first meal.  Heavy or fried foods are harder to digest and may make you feel nauseous or bloated.  Likewise meals heavy in dairy and vegetables can cause extra gas to form and this can also increase the bloating.  Drink plenty of fluids but you should avoid alcoholic beverages for 24 hours.  ACTIVITY: Your care partner should take you home directly after the procedure.  You should plan to take it easy, moving slowly for the rest of the day.  You can resume normal activity the day after the procedure however you should NOT DRIVE or use heavy machinery for 24 hours (because of the sedation medicines used during the test).    SYMPTOMS TO REPORT  IMMEDIATELY: A gastroenterologist can be reached at any hour.  During normal business hours, 8:30 AM to 5:00 PM Monday through Friday, call [phone].  After hours and on weekends, please call the GI answering service at [phone] who will take a message and have the physician on call contact you.   Following lower endoscopy (colonoscopy or flexible sigmoidoscopy):  Excessive amounts of blood in the stool  Significant tenderness or worsening of abdominal pains  Swelling of the abdomen that is new, acute  Fever of 100F or higher  FOLLOW UP: If any biopsies were taken you will be contacted by phone or by letter within the next 1-3 weeks.  Call your gastroenterologist if you have not heard about the biopsies in 3 weeks.  Our staff will call the home number listed on your records the next business day following your procedure to check on you and address any questions or concerns that you may have at that time regarding the information given to you following your procedure. This is a courtesy call and so if there is no answer at the home number and we have not heard from you through the emergency physician on call, we will assume that you have returned to your regular daily activities without incident.  SIGNATURES/CONFIDENTIALITY: You and/or your care partner have signed paperwork which will be entered into your electronic medical record.  These signatures attest to the fact that that the information above on your After Visit Summary   has been reviewed and is understood.  Full responsibility of the confidentiality of this discharge information lies with you and/or your care-partner. 

## 2012-12-24 NOTE — Op Note (Signed)
[facility] [address] [place], [address]   COLONOSCOPY PROCEDURE REPORT  PATIENT: Antonio Acosta, Antonio Acosta  MR#: [id number] BIRTHDATE: 1950/02/15 , 62  yrs. old GENDER: Male ENDOSCOPIST: Roxy Cedar, MD REFERRED VO:[id number] Karsten Ro, M.D. PROCEDURE DATE:  12/24/2012 PROCEDURE:   Colonoscopy with snare polypectomy x 4 First Screening Colonoscopy - Avg.  risk and is 50 yrs.  old or older Yes.  Prior Negative Screening - Now for repeat screening. N/A  History of Adenoma - Now for follow-up colonoscopy & has been > or = to 3 yrs.  N/A  Polyps Removed Today? Yes. ASA CLASS:   Class II INDICATIONS:average risk screening. MEDICATIONS: MAC sedation, administered by CRNA and propofol (Diprivan) 400mg  IV  DESCRIPTION OF PROCEDURE:   After the risks benefits and alternatives of the procedure were thoroughly explained, informed consent was obtained.  A digital rectal exam revealed no abnormalities of the rectum.   The LB [id number]  endoscope was introduced through the anus and advanced to the cecum, which was identified by both the appendix and ileocecal valve. No adverse events experienced.   The quality of the prep was excellent, using MoviPrep  The instrument was then slowly withdrawn as the colon was fully examined.  COLON FINDINGS: Four sessile polyps ranging between 3-62mm in size were found at the cecum and in the ascending colon.  A polypectomy was performed with a cold snare.  The resection was complete and the polyp tissue was completely retrieved.   Moderate diverticulosis was noted The finding was in the left colon.   The colon mucosa was otherwise normal.  Retroflexed views revealed internal hemorrhoids. The time to cecum=2 minutes 26 seconds. Withdrawal time=11 minutes 02 seconds.  The scope was withdrawn and the procedure completed. COMPLICATIONS: There were no complications.  ENDOSCOPIC IMPRESSION: 1.   Four polyps were found at the cecum and in the  ascending colon; polypectomy was performed with a cold snare 2.   Moderate diverticulosis was noted in the left colon 3.   The colon mucosa was otherwise normal  RECOMMENDATIONS: 1. Follow up colonoscopy in 5 years   eSigned:  Roxy Cedar, MD 12/24/2012 2:14 PM   cc: Etta Grandchild, MD and The Patient

## 2012-12-24 NOTE — Progress Notes (Signed)
Patient did not experience any of the following events: a burn prior to discharge; a fall within the facility; wrong site/side/patient/procedure/implant event; or a hospital transfer or hospital admission upon discharge from the facility. (G8907) Patient did not have preoperative order for IV antibiotic SSI prophylaxis. (G8918)  

## 2012-12-24 NOTE — Progress Notes (Signed)
Called to room to assist during endoscopic procedure.  Patient ID and intended procedure confirmed with present staff. Received instructions for my participation in the procedure from the performing physician.  

## 2012-12-25 ENCOUNTER — Telehealth: Payer: Self-pay

## 2012-12-25 NOTE — Telephone Encounter (Signed)
  Follow up Call-  Call back number 12/24/2012  Post procedure Call Back phone  # [phone]  Permission to leave phone message Yes     Patient questions:  Do you have a fever, pain , or abdominal swelling? no Pain Score  0 *  Have you tolerated food without any problems? yes  Have you been able to return to your normal activities? yes  Do you have any questions about your discharge instructions: Diet   no Medications  no Follow up visit  no  Do you have questions or concerns about your Care? no  Actions: * If pain score is 4 or above: No action needed, pain <4.

## 2012-12-30 ENCOUNTER — Encounter: Payer: Self-pay | Admitting: Internal Medicine

## 2013-01-05 LAB — HM COLONOSCOPY: HM Colonoscopy: 4

## 2013-01-06 ENCOUNTER — Other Ambulatory Visit: Payer: Self-pay | Admitting: Physician Assistant

## 2013-01-08 ENCOUNTER — Other Ambulatory Visit: Payer: Self-pay | Admitting: *Deleted

## 2013-01-08 MED ORDER — METOPROLOL TARTRATE 50 MG PO TABS: 50.0000 mg | ORAL_TABLET | Freq: Two times a day (BID) | ORAL | Status: DC

## 2013-01-15 ENCOUNTER — Ambulatory Visit (INDEPENDENT_AMBULATORY_CARE_PROVIDER_SITE_OTHER): Payer: BC Managed Care – PPO

## 2013-01-15 DIAGNOSIS — Z2911 Encounter for prophylactic immunotherapy for respiratory syncytial virus (RSV): Secondary | ICD-10-CM

## 2013-01-15 DIAGNOSIS — Z23 Encounter for immunization: Secondary | ICD-10-CM

## 2013-09-24 ENCOUNTER — Ambulatory Visit (INDEPENDENT_AMBULATORY_CARE_PROVIDER_SITE_OTHER): Payer: BC Managed Care – PPO | Admitting: Ophthalmology

## 2013-10-08 ENCOUNTER — Ambulatory Visit (INDEPENDENT_AMBULATORY_CARE_PROVIDER_SITE_OTHER): Payer: BC Managed Care – PPO | Admitting: Ophthalmology

## 2013-10-08 DIAGNOSIS — H43813 Vitreous degeneration, bilateral: Secondary | ICD-10-CM

## 2013-10-08 DIAGNOSIS — H34831 Tributary (branch) retinal vein occlusion, right eye: Secondary | ICD-10-CM

## 2013-10-08 DIAGNOSIS — D3132 Benign neoplasm of left choroid: Secondary | ICD-10-CM

## 2013-10-08 DIAGNOSIS — H35033 Hypertensive retinopathy, bilateral: Secondary | ICD-10-CM

## 2014-02-22 ENCOUNTER — Telehealth: Payer: Self-pay | Admitting: Internal Medicine

## 2014-02-22 NOTE — Telephone Encounter (Signed)
metoprolol (LOPRESSOR) 50 MG tablet [[id number]]   Patient requesting refill sent to [facility]

## 2014-02-23 NOTE — Telephone Encounter (Signed)
Pt called to check up this request

## 2014-02-23 NOTE — Telephone Encounter (Signed)
He has not been seen since 2014 He must be seen for a refill

## 2014-03-10 ENCOUNTER — Encounter: Payer: Self-pay | Admitting: Internal Medicine

## 2014-03-10 ENCOUNTER — Ambulatory Visit (INDEPENDENT_AMBULATORY_CARE_PROVIDER_SITE_OTHER): Payer: BLUE CROSS/BLUE SHIELD | Admitting: Internal Medicine

## 2014-03-10 ENCOUNTER — Telehealth: Payer: Self-pay | Admitting: Internal Medicine

## 2014-03-10 ENCOUNTER — Other Ambulatory Visit (INDEPENDENT_AMBULATORY_CARE_PROVIDER_SITE_OTHER): Payer: BLUE CROSS/BLUE SHIELD

## 2014-03-10 VITALS — BP 130/80 | HR 60 | Temp 98.7°F | Resp 16 | Ht 72.0 in | Wt 202.0 lb

## 2014-03-10 DIAGNOSIS — Z0000 Encounter for general adult medical examination without abnormal findings: Secondary | ICD-10-CM

## 2014-03-10 DIAGNOSIS — I10 Essential (primary) hypertension: Secondary | ICD-10-CM

## 2014-03-10 LAB — COMPREHENSIVE METABOLIC PANEL
ALBUMIN: 4.7 g/dL (ref 3.5–5.2)
ALT: 20 U/L (ref 0–53)
AST: 21 U/L (ref 0–37)
Alkaline Phosphatase: 87 U/L (ref 39–117)
BUN: 17 mg/dL (ref 6–23)
CALCIUM: 9.7 mg/dL (ref 8.4–10.5)
CHLORIDE: 103 meq/L (ref 96–112)
CO2: 28 meq/L (ref 19–32)
Creatinine, Ser: 1.04 mg/dL (ref 0.40–1.50)
GFR: 77.02 mL/min (ref >60.00)
GLUCOSE: 107 mg/dL — AB (ref 70–99)
Potassium: 4.6 mEq/L (ref 3.5–5.1)
SODIUM: 135 meq/L (ref 135–145)
TOTAL PROTEIN: 7.7 g/dL (ref 6.0–8.3)
Total Bilirubin: 0.8 mg/dL (ref 0.2–1.2)

## 2014-03-10 LAB — CBC WITH DIFFERENTIAL/PLATELET
Basophils Absolute: 0 10*3/uL (ref 0.0–0.1)
Basophils Relative: 0.5 % (ref 0.0–3.0)
EOS PCT: 3.2 % (ref 0.0–5.0)
Eosinophils Absolute: 0.3 10*3/uL (ref 0.0–0.7)
HCT: 41.5 % (ref 39.0–52.0)
Hemoglobin: 13.9 g/dL (ref 13.0–17.0)
LYMPHS PCT: 34.8 % (ref 12.0–46.0)
Lymphs Abs: 2.8 10*3/uL (ref 0.7–4.0)
MCHC: 33.5 g/dL (ref 30.0–36.0)
MCV: 82.9 fl (ref 78.0–100.0)
MONO ABS: 0.7 10*3/uL (ref 0.1–1.0)
MONOS PCT: 8.6 % (ref 3.0–12.0)
NEUTROS PCT: 52.9 % (ref 43.0–77.0)
Neutro Abs: 4.3 10*3/uL (ref 1.4–7.7)
Platelets: 288 10*3/uL (ref 150.0–400.0)
RBC: 5.01 Mil/uL (ref 4.22–5.81)
RDW: 14.1 % (ref 11.5–15.5)
WBC: 8.1 10*3/uL (ref 4.0–10.5)

## 2014-03-10 LAB — LIPID PANEL
Cholesterol: 213 mg/dL — ABNORMAL HIGH (ref 0–200)
HDL: 51.8 mg/dL (ref >39.00)
LDL Cholesterol: 142 mg/dL — ABNORMAL HIGH (ref 0–99)
NonHDL: 161.2
Total CHOL/HDL Ratio: 4
Triglycerides: 95 mg/dL (ref 0.0–149.0)
VLDL: 19 mg/dL (ref 0.0–40.0)

## 2014-03-10 LAB — FECAL OCCULT BLOOD, GUAIAC: Fecal Occult Blood: NEGATIVE

## 2014-03-10 LAB — TSH: TSH: 3.87 u[IU]/mL (ref 0.35–4.50)

## 2014-03-10 LAB — PSA: PSA: 1 ng/mL (ref 0.10–4.00)

## 2014-03-10 MED ORDER — METOPROLOL SUCCINATE ER 100 MG PO TB24: 100.0000 mg | ORAL_TABLET | Freq: Every day | ORAL | Status: DC

## 2014-03-10 NOTE — Telephone Encounter (Signed)
Please update pt's preferred pharmacy location to be [facility] [place].  Pt is ok to pick up his script today from the location at [place], but for future refills/scripts please send to new location.

## 2014-03-10 NOTE — Telephone Encounter (Signed)
Pharmacy updated.

## 2014-03-10 NOTE — Assessment & Plan Note (Signed)
His BP is well controlled but he does not like to take the metoprolol twice a day and he asks that I change it to a QD formulation Will change to metoprolol-xl at the same dose

## 2014-03-10 NOTE — Progress Notes (Signed)
Subjective:    Patient ID: Antonio Acosta, male    DOB: [birth year], 64 y.o.   MRN: [id number]  Hypertension This is a chronic problem. The current episode started more than 1 year ago. The problem is unchanged. The problem is controlled. Pertinent negatives include no anxiety, blurred vision, chest pain, headaches, malaise/fatigue, neck pain, orthopnea, palpitations, peripheral edema, PND, shortness of breath or sweats. Past treatments include beta blockers. The current treatment provides moderate improvement. There are no compliance problems.       Review of Systems  Constitutional: Negative.  Negative for fever, chills, malaise/fatigue, diaphoresis, appetite change and fatigue.  HENT: Negative.   Eyes: Negative.  Negative for blurred vision.  Respiratory: Negative.  Negative for cough, choking, chest tightness and shortness of breath.   Cardiovascular: Negative.  Negative for chest pain, palpitations, orthopnea, leg swelling and PND.  Gastrointestinal: Negative.  Negative for nausea, vomiting, abdominal pain, diarrhea and constipation.  Endocrine: Negative.   Genitourinary: Negative.   Musculoskeletal: Negative.  Negative for back pain, arthralgias and neck pain.  Skin: Negative.  Negative for rash.  Allergic/Immunologic: Negative.   Neurological: Negative.  Negative for headaches.  Hematological: Negative.  Negative for adenopathy. Does not bruise/bleed easily.  Psychiatric/Behavioral: Negative.   All other systems reviewed and are negative.      Objective:   Physical Exam  Constitutional: He is oriented to person, place, and time. He appears well-developed and well-nourished. No distress.  HENT:  Head: Normocephalic and atraumatic.  Mouth/Throat: Oropharynx is clear and moist. No oropharyngeal exudate.  Eyes: Conjunctivae are normal. Right eye exhibits no discharge. Left eye exhibits no discharge. No scleral icterus.  Neck: Normal range of motion. Neck supple. No JVD present.  No tracheal deviation present. No thyromegaly present.  Cardiovascular: Normal rate, regular rhythm, normal heart sounds and intact distal pulses.  Exam reveals no gallop and no friction rub.   No murmur heard. Pulmonary/Chest: Effort normal and breath sounds normal. No stridor. No respiratory distress. He has no wheezes. He has no rales. He exhibits no tenderness.  Abdominal: Soft. Bowel sounds are normal. He exhibits no distension and no mass. There is no tenderness. There is no rebound and no guarding. Hernia confirmed negative in the right inguinal area and confirmed negative in the left inguinal area.  Genitourinary: Rectum normal, prostate normal, testes normal and penis normal. Rectal exam shows no external hemorrhoid, no internal hemorrhoid, no fissure, no mass, no tenderness and anal tone normal. Guaiac negative stool. Prostate is not enlarged and not tender. Right testis shows no mass, no swelling and no tenderness. Right testis is descended. Left testis shows no mass, no swelling and no tenderness. Left testis is descended. Circumcised. No penile erythema or penile tenderness. No discharge found.  Musculoskeletal: Normal range of motion. He exhibits no edema or tenderness.  Lymphadenopathy:    He has no cervical adenopathy.       Right: No inguinal adenopathy present.       Left: No inguinal adenopathy present.  Neurological: He is oriented to person, place, and time.  Skin: Skin is warm and dry. No rash noted. He is not diaphoretic. No erythema. No pallor.  Nursing note and vitals reviewed.    Lab Results  Component Value Date   WBC 8.3 09/11/2012   HGB 13.7 09/11/2012   HCT 41.1 09/11/2012   PLT 257.0 09/11/2012   GLUCOSE 66* 09/11/2012   CHOL 222* 09/11/2012   TRIG 178.0* 09/11/2012   HDL 64.10 09/11/2012  LDLDIRECT 137.6 09/11/2012   ALT 22 09/11/2012   AST 26 09/11/2012   NA 137 09/11/2012   K 4.3 09/11/2012   CL 101 09/11/2012   CREATININE 0.9 09/11/2012   BUN 14  09/11/2012   CO2 30 09/11/2012   TSH 2.79 09/11/2012   PSA 1.19 09/11/2012   INR 0.94 12/28/2011   HGBA1C 6.0 09/11/2012       Assessment & Plan:

## 2014-03-10 NOTE — Assessment & Plan Note (Signed)
He refused a flu vax today Exam done Labs ordered  Pt ed material was given 

## 2014-03-10 NOTE — Progress Notes (Signed)
Pre visit review using our clinic review tool, if applicable. No additional management support is needed unless otherwise documented below in the visit note. 

## 2014-03-10 NOTE — Patient Instructions (Signed)

## 2014-05-25 ENCOUNTER — Emergency Department (HOSPITAL_COMMUNITY): Payer: BLUE CROSS/BLUE SHIELD | Admitting: Anesthesiology

## 2014-05-25 ENCOUNTER — Ambulatory Visit (INDEPENDENT_AMBULATORY_CARE_PROVIDER_SITE_OTHER): Payer: BLUE CROSS/BLUE SHIELD

## 2014-05-25 ENCOUNTER — Inpatient Hospital Stay (HOSPITAL_COMMUNITY)
Admission: EM | Admit: 2014-05-25 | Discharge: 2014-05-29 | DRG: 339 | Disposition: A | Discharge status: Home / Self Care | Payer: BLUE CROSS/BLUE SHIELD | Attending: General Surgery | Admitting: General Surgery

## 2014-05-25 ENCOUNTER — Telehealth: Payer: Self-pay | Admitting: Internal Medicine

## 2014-05-25 ENCOUNTER — Inpatient Hospital Stay: Admission: RE | Admit: 2014-05-25 | Payer: Self-pay | Source: Ambulatory Visit

## 2014-05-25 ENCOUNTER — Ambulatory Visit (INDEPENDENT_AMBULATORY_CARE_PROVIDER_SITE_OTHER): Payer: BLUE CROSS/BLUE SHIELD | Admitting: Family Medicine

## 2014-05-25 ENCOUNTER — Telehealth: Payer: Self-pay | Admitting: *Deleted

## 2014-05-25 ENCOUNTER — Emergency Department (HOSPITAL_COMMUNITY): Payer: BLUE CROSS/BLUE SHIELD

## 2014-05-25 ENCOUNTER — Encounter (HOSPITAL_COMMUNITY): Admission: EM | Disposition: A | Discharge status: Home / Self Care | Payer: Self-pay | Source: Home / Self Care

## 2014-05-25 ENCOUNTER — Encounter (HOSPITAL_COMMUNITY): Payer: Self-pay

## 2014-05-25 VITALS — BP 130/80 | HR 86 | Temp 98.0°F | Resp 17 | Ht 71.5 in | Wt 196.0 lb

## 2014-05-25 DIAGNOSIS — Z87891 Personal history of nicotine dependence: Secondary | ICD-10-CM

## 2014-05-25 DIAGNOSIS — K3580 Unspecified acute appendicitis: Secondary | ICD-10-CM

## 2014-05-25 DIAGNOSIS — Z7982 Long term (current) use of aspirin: Secondary | ICD-10-CM

## 2014-05-25 DIAGNOSIS — K913 Postprocedural intestinal obstruction: Secondary | ICD-10-CM | POA: Diagnosis not present

## 2014-05-25 DIAGNOSIS — R1031 Right lower quadrant pain: Secondary | ICD-10-CM

## 2014-05-25 DIAGNOSIS — I10 Essential (primary) hypertension: Secondary | ICD-10-CM | POA: Diagnosis present

## 2014-05-25 DIAGNOSIS — K352 Acute appendicitis with generalized peritonitis: Secondary | ICD-10-CM | POA: Diagnosis present

## 2014-05-25 DIAGNOSIS — K3532 Acute appendicitis with perforation and localized peritonitis, without abscess: Secondary | ICD-10-CM | POA: Diagnosis present

## 2014-05-25 HISTORY — PX: LAPAROSCOPIC APPENDECTOMY: SHX408

## 2014-05-25 LAB — COMPREHENSIVE METABOLIC PANEL
ALT: 17 U/L (ref 17–63)
AST: 24 U/L (ref 15–41)
Albumin: 4.6 g/dL (ref 3.5–5.0)
Alkaline Phosphatase: 69 U/L (ref 38–126)
Anion gap: 11 (ref 5–15)
BILIRUBIN TOTAL: 2 mg/dL — AB (ref 0.3–1.2)
BUN: 16 mg/dL (ref 6–20)
CO2: 24 mmol/L (ref 22–32)
Calcium: 9.2 mg/dL (ref 8.9–10.3)
Chloride: 100 mmol/L — ABNORMAL LOW (ref 101–111)
Creatinine, Ser: 1.06 mg/dL (ref 0.61–1.24)
GFR calc Af Amer: >60 mL/min (ref >60)
GFR calc non Af Amer: >60 mL/min (ref >60)
Glucose, Bld: 153 mg/dL — ABNORMAL HIGH (ref 65–99)
Potassium: 4.6 mmol/L (ref 3.5–5.1)
Sodium: 135 mmol/L (ref 135–145)
Total Protein: 8 g/dL (ref 6.5–8.1)

## 2014-05-25 LAB — POCT CBC
Granulocyte percent: 92.2 %G — AB (ref 37–80)
HCT, POC: 43 % — AB (ref 43.5–53.7)
Hemoglobin: 14.1 g/dL (ref 14.1–18.1)
Lymph, poc: 1.3 (ref 0.6–3.4)
MCH, POC: 27.1 pg (ref 27–31.2)
MCHC: 32.9 g/dL (ref 31.8–35.4)
MCV: 82.5 fL (ref 80–97)
MID (cbc): 0.4 (ref 0–0.9)
MPV: 8.1 fL (ref 0–99.8)
PLATELET COUNT, POC: 288 10*3/uL (ref 142–424)
POC Granulocyte: 19.5 — AB (ref 2–6.9)
POC LYMPH PERCENT: 6 %L — AB (ref 10–50)
POC MID %: 1.8 %M (ref 0–12)
RBC: 5.21 M/uL (ref 4.69–6.13)
RDW, POC: 13.7 %
WBC: 21.1 10*3/uL — AB (ref 4.6–10.2)

## 2014-05-25 LAB — CBC WITH DIFFERENTIAL/PLATELET
Basophils Absolute: 0 10*3/uL (ref 0.0–0.1)
Basophils Relative: 0 % (ref 0–1)
EOS ABS: 0 10*3/uL (ref 0.0–0.7)
Eosinophils Relative: 0 % (ref 0–5)
HEMATOCRIT: 42.1 % (ref 39.0–52.0)
HEMOGLOBIN: 14.4 g/dL (ref 13.0–17.0)
Lymphocytes Relative: 5 % — ABNORMAL LOW (ref 12–46)
Lymphs Abs: 1 10*3/uL (ref 0.7–4.0)
MCH: 28.1 pg (ref 26.0–34.0)
MCHC: 34.2 g/dL (ref 30.0–36.0)
MCV: 82.2 fL (ref 78.0–100.0)
MONO ABS: 1 10*3/uL (ref 0.1–1.0)
MONOS PCT: 5 % (ref 3–12)
NEUTROS ABS: 17 10*3/uL — AB (ref 1.7–7.7)
Neutrophils Relative %: 90 % — ABNORMAL HIGH (ref 43–77)
Platelets: 289 10*3/uL (ref 150–400)
RBC: 5.12 MIL/uL (ref 4.22–5.81)
RDW: 13.7 % (ref 11.5–15.5)
WBC: 19 10*3/uL — ABNORMAL HIGH (ref 4.0–10.5)

## 2014-05-25 LAB — POCT URINALYSIS DIPSTICK
BILIRUBIN UA: NEGATIVE
Blood, UA: NEGATIVE
Glucose, UA: NEGATIVE
Leukocytes, UA: NEGATIVE
Nitrite, UA: NEGATIVE
Protein, UA: 30
SPEC GRAV UA: 1.025
Urobilinogen, UA: 0.2
pH, UA: 5.5

## 2014-05-25 LAB — URINALYSIS, ROUTINE W REFLEX MICROSCOPIC
Bilirubin Urine: NEGATIVE
GLUCOSE, UA: NEGATIVE mg/dL
Hgb urine dipstick: NEGATIVE
KETONES UR: NEGATIVE mg/dL
LEUKOCYTES UA: NEGATIVE
NITRITE: NEGATIVE
Protein, ur: NEGATIVE mg/dL
UROBILINOGEN UA: 0.2 mg/dL (ref 0.0–1.0)
pH: 5.5 (ref 5.0–8.0)

## 2014-05-25 LAB — LIPASE, BLOOD: LIPASE: 16 U/L — AB (ref 22–51)

## 2014-05-25 SURGERY — APPENDECTOMY, LAPAROSCOPIC
Anesthesia: General | Site: Abdomen

## 2014-05-25 MED ORDER — SODIUM CHLORIDE 0.9 % IJ SOLN
INTRAMUSCULAR | Status: AC
  Filled 2014-05-25: qty 10

## 2014-05-25 MED ORDER — EPHEDRINE SULFATE 50 MG/ML IJ SOLN
INTRAMUSCULAR | Status: DC | PRN
  Administered 2014-05-25: 5 mg via INTRAVENOUS

## 2014-05-25 MED ORDER — MIDAZOLAM HCL 5 MG/5ML IJ SOLN
INTRAMUSCULAR | Status: DC | PRN
  Administered 2014-05-25: 2 mg via INTRAVENOUS

## 2014-05-25 MED ORDER — BUPIVACAINE-EPINEPHRINE 0.25% -1:200000 IJ SOLN
INTRAMUSCULAR | Status: DC | PRN
  Administered 2014-05-25: 20 mL

## 2014-05-25 MED ORDER — ROCURONIUM BROMIDE 100 MG/10ML IV SOLN
INTRAVENOUS | Status: AC
  Filled 2014-05-25: qty 1

## 2014-05-25 MED ORDER — FENTANYL CITRATE (PF) 100 MCG/2ML IJ SOLN
INTRAMUSCULAR | Status: DC | PRN
  Administered 2014-05-25: 100 ug via INTRAVENOUS
  Administered 2014-05-25 (×2): 50 ug via INTRAVENOUS

## 2014-05-25 MED ORDER — HYDROMORPHONE HCL 1 MG/ML IJ SOLN: 0.2500 mg | INTRAMUSCULAR | Status: DC | PRN

## 2014-05-25 MED ORDER — IOHEXOL 300 MG/ML  SOLN
100.0000 mL | Freq: Once | INTRAMUSCULAR | Status: AC | PRN
  Administered 2014-05-25: 100 mL via INTRAVENOUS

## 2014-05-25 MED ORDER — PIPERACILLIN-TAZOBACTAM 3.375 G IVPB
3.3750 g | Freq: Once | INTRAVENOUS | Status: AC
  Administered 2014-05-25: 3.375 g via INTRAVENOUS
  Filled 2014-05-25: qty 50

## 2014-05-25 MED ORDER — ONDANSETRON HCL 4 MG/2ML IJ SOLN
4.0000 mg | Freq: Four times a day (QID) | INTRAMUSCULAR | Status: DC | PRN
  Administered 2014-05-27: 4 mg via INTRAVENOUS
  Filled 2014-05-25: qty 2

## 2014-05-25 MED ORDER — EPHEDRINE SULFATE 50 MG/ML IJ SOLN
INTRAMUSCULAR | Status: AC
  Filled 2014-05-25: qty 1

## 2014-05-25 MED ORDER — NEOSTIGMINE METHYLSULFATE 10 MG/10ML IV SOLN
INTRAVENOUS | Status: DC | PRN
  Administered 2014-05-25: 4 mg via INTRAVENOUS

## 2014-05-25 MED ORDER — PIPERACILLIN-TAZOBACTAM 3.375 G IVPB
3.3750 g | Freq: Three times a day (TID) | INTRAVENOUS | Status: DC
  Administered 2014-05-26 – 2014-05-29 (×11): 3.375 g via INTRAVENOUS
  Filled 2014-05-25 (×12): qty 50

## 2014-05-25 MED ORDER — METOPROLOL TARTRATE 1 MG/ML IV SOLN
5.0000 mg | Freq: Three times a day (TID) | INTRAVENOUS | Status: DC
  Administered 2014-05-26 – 2014-05-29 (×9): 5 mg via INTRAVENOUS
  Filled 2014-05-25 (×14): qty 5

## 2014-05-25 MED ORDER — FENTANYL CITRATE (PF) 250 MCG/5ML IJ SOLN
INTRAMUSCULAR | Status: AC
  Filled 2014-05-25: qty 5

## 2014-05-25 MED ORDER — DEXAMETHASONE SODIUM PHOSPHATE 10 MG/ML IJ SOLN
INTRAMUSCULAR | Status: AC
  Filled 2014-05-25: qty 1

## 2014-05-25 MED ORDER — LACTATED RINGERS IR SOLN
Status: DC | PRN
  Administered 2014-05-25: 1

## 2014-05-25 MED ORDER — IOHEXOL 300 MG/ML  SOLN: 50.0000 mL | Freq: Once | INTRAMUSCULAR | Status: DC | PRN

## 2014-05-25 MED ORDER — ONDANSETRON HCL 4 MG/2ML IJ SOLN
INTRAMUSCULAR | Status: AC
  Filled 2014-05-25: qty 2

## 2014-05-25 MED ORDER — DEXAMETHASONE SODIUM PHOSPHATE 10 MG/ML IJ SOLN
INTRAMUSCULAR | Status: DC | PRN
  Administered 2014-05-25: 10 mg via INTRAVENOUS

## 2014-05-25 MED ORDER — MORPHINE SULFATE 2 MG/ML IJ SOLN
1.0000 mg | INTRAMUSCULAR | Status: DC | PRN
  Administered 2014-05-26 – 2014-05-27 (×3): 2 mg via INTRAVENOUS
  Filled 2014-05-25 (×3): qty 1

## 2014-05-25 MED ORDER — BUPIVACAINE-EPINEPHRINE 0.25% -1:200000 IJ SOLN
INTRAMUSCULAR | Status: AC
  Filled 2014-05-25: qty 1

## 2014-05-25 MED ORDER — LIDOCAINE HCL (CARDIAC) 20 MG/ML IV SOLN
INTRAVENOUS | Status: DC | PRN
  Administered 2014-05-25: 50 mg via INTRAVENOUS

## 2014-05-25 MED ORDER — HYDROMORPHONE HCL 1 MG/ML IJ SOLN
0.5000 mg | Freq: Once | INTRAMUSCULAR | Status: AC
  Administered 2014-05-25: 0.5 mg via INTRAVENOUS
  Filled 2014-05-25: qty 1

## 2014-05-25 MED ORDER — MIDAZOLAM HCL 2 MG/2ML IJ SOLN
INTRAMUSCULAR | Status: AC
  Filled 2014-05-25: qty 2

## 2014-05-25 MED ORDER — ONDANSETRON HCL 4 MG/2ML IJ SOLN: 4.0000 mg | Freq: Once | INTRAMUSCULAR | Status: AC | PRN

## 2014-05-25 MED ORDER — LIDOCAINE HCL (CARDIAC) 20 MG/ML IV SOLN
INTRAVENOUS | Status: AC
  Filled 2014-05-25: qty 5

## 2014-05-25 MED ORDER — ROCURONIUM BROMIDE 100 MG/10ML IV SOLN
INTRAVENOUS | Status: DC | PRN
  Administered 2014-05-25: 5 mg via INTRAVENOUS
  Administered 2014-05-25: 25 mg via INTRAVENOUS

## 2014-05-25 MED ORDER — PROPOFOL 10 MG/ML IV BOLUS
INTRAVENOUS | Status: DC | PRN
  Administered 2014-05-25: 150 mg via INTRAVENOUS

## 2014-05-25 MED ORDER — KCL IN DEXTROSE-NACL 20-5-0.9 MEQ/L-%-% IV SOLN
INTRAVENOUS | Status: DC
  Administered 2014-05-26 – 2014-05-29 (×4): via INTRAVENOUS
  Filled 2014-05-25 (×10): qty 1000

## 2014-05-25 MED ORDER — PHENYLEPHRINE HCL 10 MG/ML IJ SOLN
INTRAMUSCULAR | Status: DC | PRN
  Administered 2014-05-25: 40 ug via INTRAVENOUS
  Administered 2014-05-25: 80 ug via INTRAVENOUS
  Administered 2014-05-25: 40 ug via INTRAVENOUS
  Administered 2014-05-25: 80 ug via INTRAVENOUS

## 2014-05-25 MED ORDER — SUCCINYLCHOLINE CHLORIDE 20 MG/ML IJ SOLN
INTRAMUSCULAR | Status: DC | PRN
  Administered 2014-05-25: 100 mg via INTRAVENOUS

## 2014-05-25 MED ORDER — GLYCOPYRROLATE 0.2 MG/ML IJ SOLN
INTRAMUSCULAR | Status: DC | PRN
  Administered 2014-05-25: 0.6 mg via INTRAVENOUS

## 2014-05-25 MED ORDER — ONDANSETRON HCL 4 MG PO TABS: 4.0000 mg | ORAL_TABLET | Freq: Four times a day (QID) | ORAL | Status: DC | PRN

## 2014-05-25 MED ORDER — FENTANYL CITRATE (PF) 100 MCG/2ML IJ SOLN: 25.0000 ug | INTRAMUSCULAR | Status: DC | PRN

## 2014-05-25 MED ORDER — ONDANSETRON HCL 4 MG/2ML IJ SOLN
INTRAMUSCULAR | Status: DC | PRN
  Administered 2014-05-25: 4 mg via INTRAVENOUS

## 2014-05-25 MED ORDER — 0.9 % SODIUM CHLORIDE (POUR BTL) OPTIME
TOPICAL | Status: DC | PRN
Start: 2014-05-25 — End: 2014-05-25
  Administered 2014-05-25: 1000 mL

## 2014-05-25 MED ORDER — LACTATED RINGERS IV SOLN
INTRAVENOUS | Status: DC | PRN
  Administered 2014-05-25 (×2): via INTRAVENOUS

## 2014-05-25 MED ORDER — SODIUM CHLORIDE 0.9 % IV BOLUS (SEPSIS)
1000.0000 mL | Freq: Once | INTRAVENOUS | Status: AC
  Administered 2014-05-25: 1000 mL via INTRAVENOUS

## 2014-05-25 MED ORDER — PROPOFOL 10 MG/ML IV BOLUS
INTRAVENOUS | Status: AC
  Filled 2014-05-25: qty 20

## 2014-05-25 MED ORDER — HYDROMORPHONE HCL 1 MG/ML IJ SOLN
1.0000 mg | Freq: Once | INTRAMUSCULAR | Status: AC
  Administered 2014-05-25: 1 mg via INTRAVENOUS
  Filled 2014-05-25: qty 1

## 2014-05-25 SURGICAL SUPPLY — 33 items
APPLIER CLIP ROT 10 11.4 M/L (STAPLE)
CLIP APPLIE ROT 10 11.4 M/L (STAPLE) IMPLANT
CUTTER FLEX LINEAR 45M (STAPLE) ×2 IMPLANT
DECANTER SPIKE VIAL GLASS SM (MISCELLANEOUS) ×2 IMPLANT
DRAPE LAPAROSCOPIC ABDOMINAL (DRAPES) ×2 IMPLANT
DRAPE UTILITY XL STRL (DRAPES) ×2 IMPLANT
ELECT REM PT RETURN 9FT ADLT (ELECTROSURGICAL) ×2
ELECTRODE REM PT RTRN 9FT ADLT (ELECTROSURGICAL) ×1 IMPLANT
ENDOLOOP SUT PDS II  0 18 (SUTURE)
ENDOLOOP SUT PDS II 0 18 (SUTURE) IMPLANT
GLOVE BIO SURGEON STRL SZ7.5 (GLOVE) ×2 IMPLANT
GOWN STRL REUS W/ TWL XL LVL3 (GOWN DISPOSABLE) ×1 IMPLANT
GOWN STRL REUS W/TWL XL LVL3 (GOWN DISPOSABLE) ×5 IMPLANT
IV LACTATED RINGERS 1000ML (IV SOLUTION) ×2 IMPLANT
KIT BASIN OR (CUSTOM PROCEDURE TRAY) ×2 IMPLANT
LIQUID BAND (GAUZE/BANDAGES/DRESSINGS) ×2 IMPLANT
NS IRRIG 1000ML POUR BTL (IV SOLUTION) ×2 IMPLANT
PEN SKIN MARKING BROAD (MISCELLANEOUS) ×2 IMPLANT
PENCIL BUTTON HOLSTER BLD 10FT (ELECTRODE) IMPLANT
POUCH SPECIMEN RETRIEVAL 10MM (ENDOMECHANICALS) ×2 IMPLANT
RELOAD 45 VASCULAR/THIN (ENDOMECHANICALS) IMPLANT
RELOAD STAPLE TA45 3.5 REG BLU (ENDOMECHANICALS) ×2 IMPLANT
SET IRRIG TUBING LAPAROSCOPIC (IRRIGATION / IRRIGATOR) ×2 IMPLANT
SHEARS HARMONIC ACE PLUS 36CM (ENDOMECHANICALS) ×2 IMPLANT
SOLUTION ANTI FOG 6CC (MISCELLANEOUS) ×2 IMPLANT
SUT MNCRL AB 4-0 PS2 18 (SUTURE) ×2 IMPLANT
TOWEL OR 17X26 10 PK STRL BLUE (TOWEL DISPOSABLE) ×2 IMPLANT
TRAY FOLEY W/METER SILVER 14FR (SET/KITS/TRAYS/PACK) ×2 IMPLANT
TRAY LAPAROSCOPIC (CUSTOM PROCEDURE TRAY) ×2 IMPLANT
TROCAR BLADELESS OPT 5 75 (ENDOMECHANICALS) ×2 IMPLANT
TROCAR SLEEVE XCEL 5X75 (ENDOMECHANICALS) ×2 IMPLANT
TROCAR XCEL BLUNT TIP 100MML (ENDOMECHANICALS) ×2 IMPLANT
TUBING INSUFFLATION 10FT LAP (TUBING) ×2 IMPLANT

## 2014-05-25 NOTE — Patient Instructions (Signed)
Go directly over to the [facility] emergency room. Do not eat or drink anything now.  Appendicitis Appendicitis is when the appendix is swollen (inflamed). The inflammation can lead to developing a hole (perforation) and a collection of pus (abscess). CAUSES  There is not always an obvious cause of appendicitis. Sometimes it is caused by an obstruction in the appendix. The obstruction can be caused by:  A small, hard, pea-sized ball of stool (fecalith).  Enlarged lymph glands in the appendix. SYMPTOMS   Pain around your belly button (navel) that moves toward your lower right belly (abdomen). The pain can become more severe and sharp as time passes.  Tenderness in the lower right abdomen. Pain gets worse if you cough or make a sudden movement.  Feeling sick to your stomach (nauseous).  Throwing up (vomiting).  Loss of appetite.  Fever.  Constipation.  Diarrhea.  Generally not feeling well. DIAGNOSIS   Physical exam.  Blood tests.  Urine test.  X-rays or a CT scan may confirm the diagnosis. TREATMENT  Once the diagnosis of appendicitis is made, the most common treatment is to remove the appendix as soon as possible. This procedure is called appendectomy. In an open appendectomy, a cut (incision) is made in the lower right abdomen and the appendix is removed. In a laparoscopic appendectomy, usually 3 small incisions are made. Long, thin instruments and a camera tube are used to remove the appendix. Most patients go home in 24 to 48 hours after appendectomy. In some situations, the appendix may have already perforated and an abscess may have formed. The abscess may have a "wall" around it as seen on a CT scan. In this case, a drain may be placed into the abscess to remove fluid, and you may be treated with antibiotic medicines that kill germs. The medicine is given through a tube in your vein (IV). Once the abscess has resolved, it may or may not be necessary to have an  appendectomy. You may need to stay in the hospital longer than 48 hours. Document Released: 12/24/2004 Document Revised: 06/25/2011 Document Reviewed: 03/21/2009 [facility] Patient Information 2015 [place], [place]. This information is not intended to replace advice given to you by your health care provider. Make sure you discuss any questions you have with your health care provider.

## 2014-05-25 NOTE — ED Notes (Signed)
Pt c/o RLQ pain and n/v/d since last night.  Pain score 4/10 increasing w/ movement.  Pt was seen at UC earlier and lab work resulted WBC 21.

## 2014-05-25 NOTE — ED Notes (Signed)
Pt being sent by Dr. Jodi Mourning from [facility] for RLQ pain with WBC of 21 to r/o appy.

## 2014-05-25 NOTE — Op Note (Signed)
05/25/2014  10:27 PM  PATIENT:  Antonio Acosta  64 y.o. male  PRE-OPERATIVE DIAGNOSIS:  Appendicitis  POST-OPERATIVE DIAGNOSIS:  Perforated Appendicitis with generalized peritonitis  PROCEDURE:  Procedure(s): APPENDECTOMY LAPAROSCOPIC (N/A)  SURGEON:  Surgeon(s) and Role:    * Jovita Kussmaul, MD - Primary  PHYSICIAN ASSISTANT:   ASSISTANTS: none   ANESTHESIA:   general  EBL:  Total I/O In: 1000 [I.V.:1000] Out: -   BLOOD ADMINISTERED:none  DRAINS: none   LOCAL MEDICATIONS USED:  MARCAINE     SPECIMEN:  Source of Specimen:  appendix  DISPOSITION OF SPECIMEN:  PATHOLOGY  COUNTS:  YES  TOURNIQUET:  * No tourniquets in log *  DICTATION: .Dragon Dictation  After informed consent was obtained patient was brought to the operating room placed in the supine position on the operating room table. After adequate induction of general anesthesia the patient's abdomen was prepped with ChloraPrep, allowed to dry, and draped in usual sterile manner. The area below the umbilicus was infiltrated with quarter percent Marcaine. A small incision was made with a 15 blade knife. This incision was carried down through the subcutaneous tissue bluntly with a hemostat and Army-Nav[employer] retractors until the linea alba was identified. The linea alba was incised with a 15 blade knife. Each side was grasped Coker clamps and elevated anteriorly. The preperitoneal space was probed bluntly with a hemostat until the peritoneum was opened and access was gained to the abdominal cavity. A 0 Vicryl purse string stitch was placed in the fascia surrounding the opening. A Hassan cannula was placed through the opening and anchored in place with the previously placed Vicryl purse string stitch. The laparoscope was placed through the [facility] cannula. The abdomen was insufflated with carbon dioxide without difficulty. Next the suprapubic area was infiltrated with quarter percent Marcaine. A small incision was made with a 15 blade  knife. A 5 mm port was placed bluntly through this incision into the abdominal cavity. A site was then chosen in the upper abdomen for placement of a 5 mm port. The area was infiltrated with quarter percent Marcaine. A small stab incision was made with a 15 blade knife. A 5 mm port was placed bluntly through this incision and the abdominal cavity under direct vision. The laparoscope was then moved to the suprapubic port. There was significant generalized peritonitis noted with free flowing pus throughout the abdomen. Using a Glassman grasper and harmonic scalpel the right lower quadrant was inspected. The appendix was readily identified. It appeared that the appendix may have perforated along the midbody. The appendix was elevated anteriorly and the mesoappendix was taken down sharply with the harmonic scalpel. Once the base of the appendix where it joined the cecum was identified and cleared of any tissue then a laparoscopic GIA blue load 6 row stapler was placed through the [facility] cannula. The stapler was placed across the base of the appendix clamped and fired thereby dividing the base of the appendix between staple lines. A laparoscopic bag was then inserted through the [facility] cannula. The appendix was placed within the bag and the bag was sealed. The abdomen was then irrigated with copious amounts of saline until the effluent was clear. No other abnormalities were noted. The appendix and bag were removed with the [facility] cannula through the infraumbilical port without difficulty. The fascial defect was closed with the previously placed Vicryl pursestring stitch as well as with another interrupted 0 Vicryl figure-of-eight stitch. The rest of the ports were removed under direct  vision and were found to be hemostatic. The gas was allowed to escape. The skin incisions were closed with staples. The patient tolerated the procedure well. At the end of the case all needle sponge and instrument counts were correct. The  patient was then awakened and taken to recovery in stable condition.  PLAN OF CARE: Admit to inpatient   PATIENT DISPOSITION:  PACU - hemodynamically stable.   Delay start of Pharmacological VTE agent (>24hrs) due to surgical blood loss or risk of bleeding: yes

## 2014-05-25 NOTE — ED Provider Notes (Signed)
CSN: [id number]     Arrival date & time 05/25/14  1331 History   First MD Initiated Contact with Patient 05/25/14 1456     Chief Complaint  Patient presents with  . Abdominal Pain  . Emesis  . Diarrhea    HPI   64 year old male presents today with right lower quadrant pain, nausea, vomiting. Patient reports that yesterday he was feeling fine during the day, later on in the evening around 8 PM he started developing generalized lower abdominal pain, settling in his right lower quadrant. He reports he had approximately 6-8 episodes of nausea with minimal amounts of nonbloody vomiting. He reports he is unable to find comfort throughout the night, continued pain this morning. Patient reports that he had a normal bowel movement this morning. Patient denies fever, per reports he had episodes of chills throughout the night. Patient reports he was seen primary care today and found to have a white count of 21. Patient reports right over was uncomfortable as "every bump" hurt his abdomen. Patient denies any significant past medical history other and hypertension, no history of surgeries, abdominal trauma, exposure to abnormal food or drink, alcohol or drug use. Patient reports that he had 4 strawberries at 8 AM this morning, and 8 ounces of water throughout the day. Patient denies headache, chest pain, dizziness, upper abdominal pain, changes in bowel or bladder characteristics or frequency, lower extremity swelling or edema.  Past Medical History  Diagnosis Date  . Hypertension   . Substance abuse    Past Surgical History  Procedure Laterality Date  . Tonsillectomy     Family History  Problem Relation Age of Onset  . Hypertension Father   . Stroke Mother   . Kidney disease Neg Hx   . Hyperlipidemia Neg Hx   . Heart disease Neg Hx   . Early death Neg Hx   . Drug abuse Neg Hx   . Diabetes Neg Hx   . Cancer Neg Hx   . Colon cancer Neg Hx    History  Substance Use Topics  . Smoking status:  Former Research scientist (life sciences)  . Smokeless tobacco: Never Used  . Alcohol Use: No     Comment: 4-5 a day    Review of Systems  All other systems reviewed and are negative.     Allergies  Review of patient's allergies indicates no known allergies.  Home Medications   Prior to Admission medications   Medication Sig Start Date End Date Taking? Authorizing Provider  aspirin 81 MG tablet Take 81 mg by mouth daily.   Yes Historical Provider, MD  metoprolol succinate (TOPROL-XL) 100 MG 24 hr tablet Take 1 tablet (100 mg total) by mouth daily. Take with or immediately following a meal. 03/10/14  Yes Janith Lima, MD  Multiple Vitamin (MULTIVITAMIN WITH MINERALS) TABS Take 1 tablet by mouth daily. 01/06/12  Yes Jessica U Vann, DO   BP 124/77 mmHg  Pulse 98  Temp(Src) 97.8 F (36.6 C) (Oral)  Resp 20  SpO2 99% Physical Exam  Constitutional: He is oriented to person, place, and time. He appears well-developed and well-nourished.  HENT:  Head: Normocephalic and atraumatic.  Eyes: Pupils are equal, round, and reactive to light.  Neck: Normal range of motion. Neck supple. No JVD present. No tracheal deviation present. No thyromegaly present.  Cardiovascular: Regular rhythm, normal heart sounds and intact distal pulses.  Exam reveals no gallop and no friction rub.   No murmur heard. Pulmonary/Chest: Effort normal and breath  sounds normal. No stridor. No respiratory distress. He has no wheezes. He has no rales. He exhibits no tenderness.  Abdominal: Soft. He exhibits no distension. There is tenderness. There is guarding and tenderness at McBurney's point. There is no rigidity, no rebound and no CVA tenderness.  Musculoskeletal: Normal range of motion.  Lymphadenopathy:    He has no cervical adenopathy.  Neurological: He is alert and oriented to person, place, and time. Coordination normal.  Skin: Skin is warm and dry.  Psychiatric: He has a normal mood and affect. His behavior is normal. Judgment and  thought content normal.  Nursing note and vitals reviewed.   ED Course  Procedures (including critical care time) Labs Review Labs Reviewed  CBC WITH DIFFERENTIAL/PLATELET  COMPREHENSIVE METABOLIC PANEL  LIPASE, BLOOD  URINALYSIS, ROUTINE W REFLEX MICROSCOPIC    Imaging Review Dg Abd Acute W/chest  05/25/2014   CLINICAL DATA:  Diffuse abdominal pain  EXAM: DG ABDOMEN ACUTE W/ 1V CHEST  COMPARISON:  None.  FINDINGS: Cardiac shadow is within normal limits. The lungs are clear bilaterally. No bony abnormality is seen.  The abdomen shows a nonobstructive bowel gas pattern. No abnormal mass or abnormal calcifications are noted. No acute bony abnormality is seen.  IMPRESSION: No acute abnormality noted.   Electronically Signed   By: Inez Catalina M.D.   On: 05/25/2014 13:23     EKG Interpretation None      MDM   Final diagnoses:  Acute appendicitis, unspecified acute appendicitis type    Labs: CBC, CMP, lipase, urinalysis  Imaging: Acute abdomen ordered by primary provider- no significant findings; CT abdomen and pelvis with contrast  Consults: Surgery  Therapeutics: Dilaudid  Assessment: Acute appendicitis  Plan: Patient presents with acute appendicitis, he is afebrile and nontoxic appearing. Patient's pain was managed with Dilaudid here in the ED, CT scan revealed acute appendicitis without rupture or abscess. Surgery was consult and who took over patient care. Patient remained comfortable throughout his stay in the ED and was transferred to surgery without complication.      Okey Regal, PA-C 05/26/14 1952  Pamella Pert, MD 05/27/14 [phone]

## 2014-05-25 NOTE — Anesthesia Preprocedure Evaluation (Signed)
Anesthesia Evaluation  Patient identified by MRN, date of birth, ID band Patient awake    Reviewed: Allergy & Precautions, NPO status , Patient's Chart, lab work & pertinent test results  History of Anesthesia Complications Negative for: history of anesthetic complications  Airway Mallampati: II  TM Distance: >3 FB Neck ROM: Full    Dental no notable dental hx. (+) Dental Advisory Given   Pulmonary former smoker,  breath sounds clear to auscultation  Pulmonary exam normal       Cardiovascular hypertension, Pt. on medications Normal cardiovascular examRhythm:Regular Rate:Normal     Neuro/Psych negative neurological ROS  negative psych ROS   GI/Hepatic negative GI ROS, Neg liver ROS,   Endo/Other  negative endocrine ROS  Renal/GU negative Renal ROS  negative genitourinary   Musculoskeletal negative musculoskeletal ROS (+)   Abdominal   Peds negative pediatric ROS (+)  Hematology negative hematology ROS (+)   Anesthesia Other Findings   Reproductive/Obstetrics negative OB ROS                             Anesthesia Physical Anesthesia Plan  ASA: II  Anesthesia Plan: General   Post-op Pain Management:    Induction: Intravenous, Rapid sequence and Cricoid pressure planned  Airway Management Planned: Oral ETT  Additional Equipment:   Intra-op Plan:   Post-operative Plan: Extubation in OR  Informed Consent: I have reviewed the patients History and Physical, chart, labs and discussed the procedure including the risks, benefits and alternatives for the proposed anesthesia with the patient or authorized representative who has indicated his/her understanding and acceptance.   Dental advisory given  Plan Discussed with: CRNA  Anesthesia Plan Comments:         Anesthesia Quick Evaluation

## 2014-05-25 NOTE — Telephone Encounter (Signed)
[facility] [facility] - Client [place] [facility]  Patient Name: Antonio Acosta  DOB: [birth year]    Initial Comment Caller states he had discomfort yesterday around his abdominal. He vomited. Still having abdominal pain.   Nurse Assessment  Nurse: Mechele Dawley, RN, Amy Date/Time Eilene Ghazi Time): 05/25/2014 8:59:59 AM  Confirm and document reason for call. If symptomatic, describe symptoms. ---CALLER STATES THAT HE HAD DINNER AND HE STARTED FEELING AN ACHE IN HIS MIDLINE IN HIS STOMACH AREA. WHEN HE WENT TO BED HE WAS DOUBLED OVER. HE HAD TO ELEVATE HIS LEGS HE HAD A DIFFICULT TIME GETTING IN AND OUT OF BED. HE HAD A BM AND VOMITED. HE DRANK SOME COKE. HE GOT HOT AND COLD. NO FEVER. HE IS NOT IN TERRIBLE PAIN RIGHT NOW. DULL ACHE - VERY UNCOMFORTABLE FEELING. HE FEELS HE STRETCHED HIS ABD MUSCLES WITH THE VOMITING. 4/10 WITH ABD NOW. LAST NIGHT HE WAS 9/10. NO VOMITING THIS MORNING. VOMITING WAS HIS DINNER ONLY. BM LAST NIGHT WAS NORMAL.  Has the patient traveled out of the country within the last 30 days? ---Not Applicable  Does the patient require triage? ---Yes  Related visit to physician within the last 2 weeks? ---No  Does the PT have any chronic conditions? (i.e. diabetes, asthma, etc.) ---Yes  List chronic conditions. ---BP     Guidelines    Guideline Title Affirmed Question Affirmed Notes  Abdominal Pain - Male [1] MILD-MODERATE pain AND [2] constant AND [3] present > 2 hours    Final Disposition User   See Physician within 4 Hours (or PCP triage) [place], RN, Amy

## 2014-05-25 NOTE — Telephone Encounter (Signed)
[place] [facility] - Client [place] [facility] Patient Name: VENSON FERENCZ Gender: Male DOB: 07-28-1950 Age: 64 Y 9 M 8 D Return Phone Number: [phone] (Primary) Address: City/State/Zip: [place] Client [place] [facility] - Client Client Site [place] - Day Physician [place], [place] Type Call Call Type Triage / Clinical Caller Name Yaxiel Relationship To Patient Self Appointment Disposition EMR Appointment Attempted - Not Scheduled Info pasted into Epic Yes Return Phone Number [phone] (Primary) Chief Complaint Abdominal Pain Initial Comment Caller states he had discomfort yesterday around his abdominal. He vomited. Still having abdominal pain. PreDisposition Did not know what to do Nurse Assessment Nurse: Mechele Dawley, RN, Amy Date/Time Eilene Ghazi Time): 05/25/2014 8:59:59 AM Confirm and document reason for call. If symptomatic, describe symptoms. ---CALLER STATES THAT HE HAD DINNER AND HE STARTED FEELING AN ACHE IN HIS MIDLINE IN HIS STOMACH AREA. WHEN HE WENT TO BED HE WAS DOUBLED OVER. HE HAD TO ELEVATE HIS LEGS HE HAD A DIFFICULT TIME GETTING IN AND OUT OF BED. HE HAD A BM AND VOMITED. HE DRANK SOME COKE. HE GOT HOT AND COLD. NO FEVER. HE IS NOT IN TERRIBLE PAIN RIGHT NOW. DULL ACHE - VERY UNCOMFORTABLE FEELING. HE FEELS HE STRETCHED HIS ABD MUSCLES WITH THE VOMITING. 4/10 WITH ABD NOW. LAST NIGHT HE WAS 9/10. NO VOMITING THIS MORNING. VOMITING WAS HIS DINNER ONLY. BM LAST NIGHT WAS NORMAL. Has the patient traveled out of the country within the last 30 days? ---Not Applicable Does the patient require triage? ---Yes Related visit to physician within the last 2 weeks? ---No Does the PT have any chronic conditions? (i.e. diabetes, asthma, etc.) ---Yes List chronic conditions. ---BP Guidelines Guideline Title Affirmed Question Affirmed Notes Nurse Date/Time (Eastern Time) Abdominal Pain -  Male [1] MILD-MODERATE pain AND [2] constant [place], RN, Amy 05/25/2014 9:00:52 AM PLEASE NOTE: All timestamps contained within this report are represented as [place] Standard Time. CONFIDENTIALTY NOTICE: This fax transmission is intended only for the addressee. It contains information that is legally privileged, confidential or otherwise protected from use or disclosure. If you are not the intended recipient, you are strictly prohibited from reviewing, disclosing, copying using or disseminating any of this information or taking any action in reliance on or regarding this information. If you have received this fax in error, please notify us immediately by telephone so that we can arrange for its return to [place]. Phone: [phone], Toll-Free: [phone], Fax: [phone] Page: 2 of 2 Call Id: [id number] Guidelines Guideline Title Affirmed Question Affirmed Notes Nurse Date/Time Eilene Ghazi Time) AND [3] present > 2 hours Disp. Time Eilene Ghazi Time) Disposition Final User 05/25/2014 9:10:11 AM See Physician within 4 Hours (or PCP triage) Yes Mechele Dawley, RN, Amy Caller Understands: Yes Disagree/Comply: Comply Care Advice Given Per Guideline SEE PHYSICIAN WITHIN 4 HOURS (or PCP triage): CARE ADVICE given per Abdominal Pain, Male (Adult) guideline. CALL BACK IF: * You become worse. After Care Instructions Given Call Event Type User Date / Time Description Referrals [facility]

## 2014-05-25 NOTE — H&P (Signed)
Antonio Acosta is an 64 y.o. male.   Chief Complaint: abdominal pain HPI: The patient is a 64yo wm who presents with RLQ abdominal pain that started yesterday. He mostly feels very fatigued. He has had some nausea. No fever. CT shows appendicitis  Past Medical History  Diagnosis Date  . Hypertension   . Substance abuse     Past Surgical History  Procedure Laterality Date  . Tonsillectomy      Family History  Problem Relation Age of Onset  . Hypertension Father   . Stroke Mother   . Kidney disease Neg Hx   . Hyperlipidemia Neg Hx   . Heart disease Neg Hx   . Early death Neg Hx   . Drug abuse Neg Hx   . Diabetes Neg Hx   . Cancer Neg Hx   . Colon cancer Neg Hx    Social History:  reports that he has quit smoking. He has never used smokeless tobacco. He reports that he does not drink alcohol or use illicit drugs.  Allergies: No Known Allergies   (Not in a hospital admission)  Results for orders placed or performed during the hospital encounter of 05/25/14 (from the past 48 hour(s))  Urinalysis, Routine w reflex microscopic     Status: Abnormal   Collection Time: 05/25/14  1:31 PM  Result Value Ref Range   Color, Urine YELLOW YELLOW   APPearance CLEAR CLEAR   Specific Gravity, Urine >1.046 (H) 1.005 - 1.030   pH 5.5 5.0 - 8.0   Glucose, UA NEGATIVE NEGATIVE mg/dL   Hgb urine dipstick NEGATIVE NEGATIVE   Bilirubin Urine NEGATIVE NEGATIVE   Ketones, ur NEGATIVE NEGATIVE mg/dL   Protein, ur NEGATIVE NEGATIVE mg/dL   Urobilinogen, UA 0.2 0.0 - 1.0 mg/dL   Nitrite NEGATIVE NEGATIVE   Leukocytes, UA NEGATIVE NEGATIVE    Comment: MICROSCOPIC NOT DONE ON URINES WITH NEGATIVE PROTEIN, BLOOD, LEUKOCYTES, NITRITE, OR GLUCOSE <1000 mg/dL.  CBC with Differential     Status: Abnormal   Collection Time: 05/25/14  3:29 PM  Result Value Ref Range   WBC 19.0 (H) 4.0 - 10.5 K/uL   RBC 5.12 4.22 - 5.81 MIL/uL   Hemoglobin 14.4 13.0 - 17.0 g/dL   HCT 42.1 39.0 - 52.0 %   MCV 82.2  78.0 - 100.0 fL   MCH 28.1 26.0 - 34.0 pg   MCHC 34.2 30.0 - 36.0 g/dL   RDW 13.7 11.5 - 15.5 %   Platelets 289 150 - 400 K/uL   Neutrophils Relative % 90 (H) 43 - 77 %   Neutro Abs 17.0 (H) 1.7 - 7.7 K/uL   Lymphocytes Relative 5 (L) 12 - 46 %   Lymphs Abs 1.0 0.7 - 4.0 K/uL   Monocytes Relative 5 3 - 12 %   Monocytes Absolute 1.0 0.1 - 1.0 K/uL   Eosinophils Relative 0 0 - 5 %   Eosinophils Absolute 0.0 0.0 - 0.7 K/uL   Basophils Relative 0 0 - 1 %   Basophils Absolute 0.0 0.0 - 0.1 K/uL  Comprehensive metabolic panel     Status: Abnormal   Collection Time: 05/25/14  3:29 PM  Result Value Ref Range   Sodium 135 135 - 145 mmol/L   Potassium 4.6 3.5 - 5.1 mmol/L   Chloride 100 (L) 101 - 111 mmol/L   CO2 24 22 - 32 mmol/L   Glucose, Bld 153 (H) 65 - 99 mg/dL   BUN 16 6 - 20 mg/dL  Creatinine, Ser 1.06 0.61 - 1.24 mg/dL   Calcium 9.2 8.9 - 10.3 mg/dL   Total Protein 8.0 6.5 - 8.1 g/dL   Albumin 4.6 3.5 - 5.0 g/dL   AST 24 15 - 41 U/L   ALT 17 17 - 63 U/L   Alkaline Phosphatase 69 38 - 126 U/L   Total Bilirubin 2.0 (H) 0.3 - 1.2 mg/dL   GFR calc non Af Amer >60 >60 mL/min   GFR calc Af Amer >60 >60 mL/min    Comment: (NOTE) The eGFR has been calculated using the CKD EPI equation. This calculation has not been validated in all clinical situations. eGFR's persistently <60 mL/min signify possible Chronic Kidney Disease.    Anion gap 11 5 - 15  Lipase, blood     Status: Abnormal   Collection Time: 05/25/14  3:29 PM  Result Value Ref Range   Lipase 16 (L) 22 - 51 U/L   Ct Abdomen Pelvis W Contrast  05/25/2014   CLINICAL DATA:  Right lower quadrant pain, evaluate for appendicitis. Leukocytosis.  EXAM: CT ABDOMEN AND PELVIS WITH CONTRAST  TECHNIQUE: Multidetector CT imaging of the abdomen and pelvis was performed using the standard protocol following bolus administration of intravenous contrast.  CONTRAST:  176mL OMNIPAQUE IOHEXOL 300 MG/ML  SOLN  COMPARISON:  None.  FINDINGS:  Lower chest: Lung bases show scattered scarring or atelectasis. Heart size normal. Coronary artery calcification. No pericardial or pleural effusion.  Hepatobiliary: Liver and gallbladder are unremarkable. No biliary ductal dilatation.  Pancreas: Negative.  Spleen: Negative.  Adrenals/Urinary Tract: Adrenal glands and kidneys are unremarkable. Ureters are decompressed. Bladder is unremarkable.  Stomach/Bowel: Stomach and small bowel are unremarkable. Appendix is dilated, measuring up to 10 mm in diameter. There is periappendiceal fat stranding and haziness. Colon is unremarkable.  Vascular/Lymphatic: Atherosclerotic calcification of the arterial vasculature without abdominal aortic aneurysm. No pathologically enlarged lymph nodes.  Reproductive: Prostate is enlarged.  Other: Small scattered ascites. Mesenteries and peritoneum are otherwise unremarkable.  Musculoskeletal: No worrisome lytic or sclerotic lesions. Prominent Schmorl's node in the superior endplate of Y69.  IMPRESSION: 1. Acute appendicitis without without evidence of rupture or abscess. 2. Scattered ascites. 3. Coronary artery calcification. 4. Enlarged prostate.   Electronically Signed   By: Lorin Picket M.D.   On: 05/25/2014 16:25   Dg Abd Acute W/chest  05/25/2014   CLINICAL DATA:  Diffuse abdominal pain  EXAM: DG ABDOMEN ACUTE W/ 1V CHEST  COMPARISON:  None.  FINDINGS: Cardiac shadow is within normal limits. The lungs are clear bilaterally. No bony abnormality is seen.  The abdomen shows a nonobstructive bowel gas pattern. No abnormal mass or abnormal calcifications are noted. No acute bony abnormality is seen.  IMPRESSION: No acute abnormality noted.   Electronically Signed   By: Inez Catalina M.D.   On: 05/25/2014 13:23    Review of Systems  Constitutional: Positive for malaise/fatigue.  HENT: Negative.   Eyes: Negative.   Respiratory: Negative.   Cardiovascular: Negative.   Gastrointestinal: Positive for nausea and abdominal pain.   Genitourinary: Negative.   Musculoskeletal: Negative.   Skin: Negative.   Neurological: Negative.   Endo/Heme/Allergies: Negative.   Psychiatric/Behavioral: Negative.     Blood pressure 149/87, pulse 94, temperature 98.2 F (36.8 C), temperature source Oral, resp. rate 16, SpO2 96 %. Physical Exam  Constitutional: He is oriented to person, place, and time. He appears well-developed and well-nourished.  HENT:  Head: Normocephalic and atraumatic.  Eyes: Conjunctivae and EOM are  normal. Pupils are equal, round, and reactive to light.  Neck: Normal range of motion. Neck supple.  Cardiovascular: Normal rate, regular rhythm and normal heart sounds.   Respiratory: Effort normal and breath sounds normal.  GI: Soft. Bowel sounds are normal.  Focal tenderness RLQ. No peritonitis  Musculoskeletal: Normal range of motion.  Neurological: He is alert and oriented to person, place, and time.  Skin: Skin is warm and dry.  Psychiatric: He has a normal mood and affect. His behavior is normal.     Assessment/Plan The pt appears to have acute appendicitis. Because of the risk of perforation and sepsis i think he would benefit from having his appendix removed. i have discussed with him the risks and benefits of surgery as well as some of the technical aspects and he understands and wishes to proceed.  TOTH III,Sabeen Piechocki S 05/25/2014, 7:38 PM

## 2014-05-25 NOTE — Transfer of Care (Signed)
Immediate Anesthesia Transfer of Care Note  Patient: Antonio Acosta  Procedure(s) Performed: Procedure(s): APPENDECTOMY LAPAROSCOPIC (N/A)  Patient Location: PACU  Anesthesia Type:General  Level of Consciousness: awake, alert , oriented and patient cooperative  Airway & Oxygen Therapy: Patient Spontanous Breathing and Patient connected to face mask oxygen  Post-op Assessment: Report given to RN and Post -op Vital signs reviewed and stable  Post vital signs: Reviewed and stable  Last Vitals:  Filed Vitals:   05/25/14 1925  BP: 149/87  Pulse: 94  Temp: 36.8 C  Resp: 16    Complications: No apparent anesthesia complications

## 2014-05-25 NOTE — Telephone Encounter (Signed)
Left vm to call back to make appt

## 2014-05-25 NOTE — Progress Notes (Signed)
Subjective: 64 year old man who is retired. He was fine yesterday, and it is work out and everything. Yesterday he started having some discomfort in the right lower quadrant. He is better. He developed more aching pain. He felt like it would help to vomit, he did a lot of dry heaving. He finally vomited is meal in the night. He moves his bowels at about 2 AM. He felt better when he got up this morning is still hurting across abdomen. No documented fevers. He a little fruit this morning to take his blood pressure medicine. He has not had any dysuria. He says the low abdomen just a little tender just from his exercising. He has not had any previous surgeries. His PCP is [facility].  Objective: He is in some discomfort, first to sit to be more comfortable. He is not warm to touch. Throat clear. Neck supple without significant nodes. Chest clear to auscultation. Heart regular without murmurs. No CVA tenderness. Abdomen has minimal bowel sounds. It feels a little tight all over, generalized guarding. He is tender in the right lower quadrant. No rebound.  Assessment: Right lower Quadrant abdominal pain, etiology undetermined  Plan: Abdominal series CBC and urinalysis  Results for orders placed or performed in visit on 05/25/14  POCT CBC  Result Value Ref Range   WBC 21.1 (A) 4.6 - 10.2 K/uL   Lymph, poc 1.3 0.6 - 3.4   POC LYMPH PERCENT 6.0 (A) 10 - 50 %L   MID (cbc) 0.4 0 - 0.9   POC MID % 1.8 0 - 12 %M   POC Granulocyte 19.5 (A) 2 - 6.9   Granulocyte percent 92.2 (A) 37 - 80 %G   RBC 5.21 4.69 - 6.13 M/uL   Hemoglobin 14.1 14.1 - 18.1 g/dL   HCT, POC 43.0 (A) 43.5 - 53.7 %   MCV 82.5 80 - 97 fL   MCH, POC 27.1 27 - 31.2 pg   MCHC 32.9 31.8 - 35.4 g/dL   RDW, POC 13.7 %   Platelet Count, POC 288 142 - 424 K/uL   MPV 8.1 0 - 99.8 fL  POCT urinalysis dipstick  Result Value Ref Range   Color, UA yellow    Clarity, UA clear    Glucose, UA neg    Bilirubin, UA neg    Ketones, UA trace     Spec Grav, UA 1.025    Blood, UA neg    pH, UA 5.5    Protein, UA 30    Urobilinogen, UA 0.2    Nitrite, UA neg    Leukocytes, UA Negative    [facility] reading (PRIMARY) by  Dr. Linna Darner Nonspecific abdomen. Not much bowel gas. No free air.  Most consistent with an acute appendicitis.  We'll send the patient directly to the emergency room at [facility]. He needs to be evaluated by a surgeon and have a CT scan of his abdomen.

## 2014-05-25 NOTE — Anesthesia Procedure Notes (Addendum)
Procedure Name: Intubation Date/Time: 05/25/2014 9:14 PM Performed by: Anne Fu Pre-anesthesia Checklist: Patient identified, Emergency Drugs available, Suction available and Patient being monitored Patient Re-evaluated:Patient Re-evaluated prior to inductionOxygen Delivery Method: Circle System Utilized Preoxygenation: Pre-oxygenation with 100% oxygen Intubation Type: IV induction Ventilation: Mask ventilation without difficulty Laryngoscope Size: Mac and 4 Grade View: Grade II Tube type: Oral Tube size: 7.5 mm Number of attempts: 1 Airway Equipment and Method: Stylet and Oral airway Placement Confirmation: ETT inserted through vocal cords under direct vision,  positive ETCO2 and breath sounds checked- equal and bilateral Secured at: 23 cm Tube secured with: Tape Dental Injury: Teeth and Oropharynx as per pre-operative assessment  Comments: Placed per Jillyn Hidden MD.

## 2014-05-25 NOTE — Anesthesia Postprocedure Evaluation (Signed)
  Anesthesia Post-op Note  Patient: Antonio Acosta  Procedure(s) Performed: Procedure(s) (LRB): APPENDECTOMY LAPAROSCOPIC (N/A)  Patient Location: PACU  Anesthesia Type: General  Level of Consciousness: awake and alert   Airway and Oxygen Therapy: Patient Spontanous Breathing  Post-op Pain: mild  Post-op Assessment: Post-op Vital signs reviewed, Patient's Cardiovascular Status Stable, Respiratory Function Stable, Patent Airway and No signs of Nausea or vomiting  Last Vitals:  Filed Vitals:   05/25/14 1925  BP: 149/87  Pulse: 94  Temp: 36.8 C  Resp: 16    Post-op Vital Signs: stable   Complications: No apparent anesthesia complications

## 2014-05-26 ENCOUNTER — Encounter (HOSPITAL_COMMUNITY): Payer: Self-pay | Admitting: General Surgery

## 2014-05-26 LAB — CBC
HEMATOCRIT: 37.6 % — AB (ref 39.0–52.0)
HEMOGLOBIN: 12.6 g/dL — AB (ref 13.0–17.0)
MCH: 27.8 pg (ref 26.0–34.0)
MCHC: 33.5 g/dL (ref 30.0–36.0)
MCV: 83 fL (ref 78.0–100.0)
PLATELETS: 232 10*3/uL (ref 150–400)
RBC: 4.53 MIL/uL (ref 4.22–5.81)
RDW: 14.1 % (ref 11.5–15.5)
WBC: 18.9 10*3/uL — ABNORMAL HIGH (ref 4.0–10.5)

## 2014-05-26 LAB — BASIC METABOLIC PANEL
ANION GAP: 9 (ref 5–15)
BUN: 13 mg/dL (ref 6–20)
CALCIUM: 8.5 mg/dL — AB (ref 8.9–10.3)
CO2: 23 mmol/L (ref 22–32)
Chloride: 102 mmol/L (ref 101–111)
Creatinine, Ser: 0.94 mg/dL (ref 0.61–1.24)
GFR calc Af Amer: >60 mL/min (ref >60)
GFR calc non Af Amer: >60 mL/min (ref >60)
Glucose, Bld: 151 mg/dL — ABNORMAL HIGH (ref 65–99)
Potassium: 4.1 mmol/L (ref 3.5–5.1)
SODIUM: 134 mmol/L — AB (ref 135–145)

## 2014-05-26 MED ORDER — ENOXAPARIN SODIUM 40 MG/0.4ML ~~LOC~~ SOLN
40.0000 mg | SUBCUTANEOUS | Status: DC
  Administered 2014-05-27 – 2014-05-29 (×3): 40 mg via SUBCUTANEOUS
  Filled 2014-05-26 (×4): qty 0.4

## 2014-05-26 NOTE — Progress Notes (Signed)
Pt was due to void by 0520. Assisted pt to bathroom. Pt was unable to void. Bladder scanned pt with an amount of 146. Paged MD on call. No new orders received. MD suggested to give pt more time.

## 2014-05-26 NOTE — Progress Notes (Signed)
ANTIBIOTIC CONSULT NOTE - INITIAL  Pharmacy Consult for Zosyn Indication: Peritonitis  No Known Allergies  Patient Measurements: Height: 6' (182.9 cm) Weight: 196 lb (88.905 kg) IBW/kg (Calculated) : 77.6   Vital Signs: Temp: 98.5 F (36.9 C) (05/19 0018) Temp Source: Axillary (05/19 0018) BP: 100/61 mmHg (05/19 0018) Pulse Rate: 83 (05/19 0018) Intake/Output from previous day: 05/18 0701 - 05/19 0700 In: 1100 [I.V.:1100] Out: 550 [Urine:450; Blood:100] Intake/Output from this shift: Total I/O In: 1100 [I.V.:1100] Out: 550 [Urine:450; Blood:100]  Labs:  Recent Labs  05/25/14 1233 05/25/14 1529  WBC 21.1* 19.0*  HGB 14.1 14.4  PLT  --  289  CREATININE  --  1.06   Estimated Creatinine Clearance: 80.3 mL/min (by C-G formula based on Cr of 1.06). No results for input(s): VANCOTROUGH, VANCOPEAK, VANCORANDOM, GENTTROUGH, GENTPEAK, GENTRANDOM, TOBRATROUGH, TOBRAPEAK, TOBRARND, AMIKACINPEAK, AMIKACINTROU, AMIKACIN in the last 72 hours.   Microbiology: No results found for this or any previous visit (from the past 720 hour(s)).  Medical History: Past Medical History  Diagnosis Date  . Hypertension   . Substance abuse     Medications:  Prescriptions prior to admission  Medication Sig Dispense Refill Last Dose  . aspirin 81 MG tablet Take 81 mg by mouth daily.   05/25/2014 at 0830  . metoprolol succinate (TOPROL-XL) 100 MG 24 hr tablet Take 1 tablet (100 mg total) by mouth daily. Take with or immediately following a meal. 90 tablet 3 05/25/2014 at 0830  . Multiple Vitamin (MULTIVITAMIN WITH MINERALS) TABS Take 1 tablet by mouth daily.   05/25/2014 at 0830   Scheduled:  . metoprolol  5 mg Intravenous 3 times per day  . piperacillin-tazobactam (ZOSYN)  IV  3.375 g Intravenous Q8H   Infusions:  . dextrose 5 % and 0.9 % NaCl with KCl 20 mEq/L     Assessment: 42 yoM c/o RLQ abdominal pain.  Zosyn per Rx for peritonitis.   Goal of Therapy:  Treat infection  Plan:    Zosyn 3.375 Gm IV q8h EI  F/u cultures/SCr as needed  Dorrene German 05/26/2014,12:52 AM

## 2014-05-26 NOTE — Progress Notes (Signed)
Patient ID: Antonio Acosta, male   DOB: December 02, 1950, 64 y.o.   MRN: [id number] 1 Day Post-Op  Subjective: Pt feels well.  Wants to go home.  No flatus.  No pain  Objective: Vital signs in last 24 hours: Temp:  [97.6 F (36.4 C)-98.7 F (37.1 C)] 98.2 F (36.8 C) (05/19 0506) Pulse Rate:  [43-157] 84 (05/19 0506) Resp:  [16-21] 16 (05/19 0506) BP: (100-149)/(53-118) 112/70 mmHg (05/19 0506) SpO2:  [92 %-100 %] 98 % (05/19 0506) Weight:  [88.905 kg (196 lb)] 88.905 kg (196 lb) (05/18 2326) Last BM Date: 05/25/14  Intake/Output from previous day: 05/18 0701 - 05/19 0700 In: 1100 [I.V.:1100] Out: 550 [Urine:450; Blood:100] Intake/Output this shift: Total I/O In: -  Out: 200 [Urine:200]  PE: Abd: soft, minimally tender, ND, incisions c/d/i, few BS Heart: regular Lungs: CTAB  Lab Results:   Recent Labs  05/25/14 1529 05/26/14 0450  WBC 19.0* 18.9*  HGB 14.4 12.6*  HCT 42.1 37.6*  PLT 289 232   BMET  Recent Labs  05/25/14 1529 05/26/14 0450  NA 135 134*  K 4.6 4.1  CL 100* 102  CO2 24 23  GLUCOSE 153* 151*  BUN 16 13  CREATININE 1.06 0.94  CALCIUM 9.2 8.5*   PT/INR No results for input(s): LABPROT, INR in the last 72 hours. CMP     Component Value Date/Time   NA 134* 05/26/2014 0450   K 4.1 05/26/2014 0450   CL 102 05/26/2014 0450   CO2 23 05/26/2014 0450   GLUCOSE 151* 05/26/2014 0450   BUN 13 05/26/2014 0450   CREATININE 0.94 05/26/2014 0450   CALCIUM 8.5* 05/26/2014 0450   PROT 8.0 05/25/2014 1529   ALBUMIN 4.6 05/25/2014 1529   AST 24 05/25/2014 1529   ALT 17 05/25/2014 1529   ALKPHOS 69 05/25/2014 1529   BILITOT 2.0* 05/25/2014 1529   GFRNONAA >60 05/26/2014 0450   GFRAA >60 05/26/2014 0450   Lipase     Component Value Date/Time   LIPASE 16* 05/25/2014 1529       Studies/Results: Ct Abdomen Pelvis W Contrast  05/25/2014   CLINICAL DATA:  Right lower quadrant pain, evaluate for appendicitis. Leukocytosis.  EXAM: CT ABDOMEN AND PELVIS  WITH CONTRAST  TECHNIQUE: Multidetector CT imaging of the abdomen and pelvis was performed using the standard protocol following bolus administration of intravenous contrast.  CONTRAST:  15mL OMNIPAQUE IOHEXOL 300 MG/ML  SOLN  COMPARISON:  None.  FINDINGS: Lower chest: Lung bases show scattered scarring or atelectasis. Heart size normal. Coronary artery calcification. No pericardial or pleural effusion.  Hepatobiliary: Liver and gallbladder are unremarkable. No biliary ductal dilatation.  Pancreas: Negative.  Spleen: Negative.  Adrenals/Urinary Tract: Adrenal glands and kidneys are unremarkable. Ureters are decompressed. Bladder is unremarkable.  Stomach/Bowel: Stomach and small bowel are unremarkable. Appendix is dilated, measuring up to 10 mm in diameter. There is periappendiceal fat stranding and haziness. Colon is unremarkable.  Vascular/Lymphatic: Atherosclerotic calcification of the arterial vasculature without abdominal aortic aneurysm. No pathologically enlarged lymph nodes.  Reproductive: Prostate is enlarged.  Other: Small scattered ascites. Mesenteries and peritoneum are otherwise unremarkable.  Musculoskeletal: No worrisome lytic or sclerotic lesions. Prominent Schmorl's node in the superior endplate of G64.  IMPRESSION: 1. Acute appendicitis without without evidence of rupture or abscess. 2. Scattered ascites. 3. Coronary artery calcification. 4. Enlarged prostate.   Electronically Signed   By: Lorin Picket M.D.   On: 05/25/2014 16:25   Dg Abd Acute W/chest  05/25/2014  CLINICAL DATA:  Diffuse abdominal pain  EXAM: DG ABDOMEN ACUTE W/ 1V CHEST  COMPARISON:  None.  FINDINGS: Cardiac shadow is within normal limits. The lungs are clear bilaterally. No bony abnormality is seen.  The abdomen shows a nonobstructive bowel gas pattern. No abnormal mass or abnormal calcifications are noted. No acute bony abnormality is seen.  IMPRESSION: No acute abnormality noted.   Electronically Signed   By: Inez Catalina M.D.   On: 05/25/2014 13:23    Anti-infectives: Anti-infectives    Start     Dose/Rate Route Frequency Ordered Stop   05/26/14 0200  piperacillin-tazobactam (ZOSYN) IVPB 3.375 g     3.375 g 12.5 mL/hr over 240 Minutes Intravenous Every 8 hours 05/25/14 2351     05/25/14 1745  piperacillin-tazobactam (ZOSYN) IVPB 3.375 g     3.375 g 12.5 mL/hr over 240 Minutes Intravenous  Once 05/25/14 1740 05/25/14 2012       Assessment/Plan  POD 1, s/p lap appy for perforated appendicitis  -clear liquids today -mobilize and pulm toilet -cont zosyn DVT prophylaxis SCD/ Lovenox starting tomorrow am  LOS: 1 day    Nyellie Yetter E 05/26/2014, 9:01 AM Pager: [phone]

## 2014-05-27 LAB — CBC
HEMATOCRIT: 38 % — AB (ref 39.0–52.0)
Hemoglobin: 12.5 g/dL — ABNORMAL LOW (ref 13.0–17.0)
MCH: 27.6 pg (ref 26.0–34.0)
MCHC: 32.9 g/dL (ref 30.0–36.0)
MCV: 83.9 fL (ref 78.0–100.0)
Platelets: 237 10*3/uL (ref 150–400)
RBC: 4.53 MIL/uL (ref 4.22–5.81)
RDW: 14.4 % (ref 11.5–15.5)
WBC: 18.4 10*3/uL — ABNORMAL HIGH (ref 4.0–10.5)

## 2014-05-27 LAB — BASIC METABOLIC PANEL
Anion gap: 11 (ref 5–15)
BUN: 12 mg/dL (ref 6–20)
CHLORIDE: 104 mmol/L (ref 101–111)
CO2: 22 mmol/L (ref 22–32)
CREATININE: 0.9 mg/dL (ref 0.61–1.24)
Calcium: 8.5 mg/dL — ABNORMAL LOW (ref 8.9–10.3)
GFR calc Af Amer: >60 mL/min (ref >60)
GFR calc non Af Amer: >60 mL/min (ref >60)
Glucose, Bld: 133 mg/dL — ABNORMAL HIGH (ref 65–99)
Potassium: 3.8 mmol/L (ref 3.5–5.1)
Sodium: 137 mmol/L (ref 135–145)

## 2014-05-27 MED ORDER — ZOLPIDEM TARTRATE 10 MG PO TABS
10.0000 mg | ORAL_TABLET | Freq: Every evening | ORAL | Status: DC | PRN
  Administered 2014-05-27 – 2014-05-28 (×2): 10 mg via ORAL
  Filled 2014-05-27 (×2): qty 1

## 2014-05-27 MED ORDER — HYDROMORPHONE HCL 1 MG/ML IJ SOLN
0.5000 mg | INTRAMUSCULAR | Status: DC | PRN
  Administered 2014-05-27 – 2014-05-28 (×5): 1 mg via INTRAVENOUS
  Filled 2014-05-27 (×5): qty 1

## 2014-05-27 NOTE — Progress Notes (Signed)
Patient ID: Antonio Acosta, male   DOB: Aug 23, 1950, 64 y.o.   MRN: [id number] 2 Days Post-Op  Subjective: Intermittent nausea.  No flatus.  Lots of belching.  Biggest complaint is he can't sleep  Objective: Vital signs in last 24 hours: Temp:  [97.8 F (36.6 C)-98.2 F (36.8 C)] 98.2 F (36.8 C) (05/20 0528) Pulse Rate:  [82-91] 85 (05/20 0528) Resp:  [16-18] 18 (05/20 0528) BP: (107-162)/(64-88) 131/80 mmHg (05/20 0528) SpO2:  [95 %-100 %] 100 % (05/20 0528) Last BM Date: 05/25/14  Intake/Output from previous day: 05/19 0701 - 05/20 0700 In: 3506.7 [P.O.:960; I.V.:2446.7; IV Piggyback:100] Out: 3000 [Urine:3000] Intake/Output this shift: Total I/O In: -  Out: 400 [Urine:400]  PE: Abd: soft, some distention, very few BS, incisions c/d/i, minimally tender Heart: regular Lungs: CTAB  Lab Results:   Recent Labs  05/26/14 0450 05/27/14 0445  WBC 18.9* 18.4*  HGB 12.6* 12.5*  HCT 37.6* 38.0*  PLT 232 237   BMET  Recent Labs  05/26/14 0450 05/27/14 0445  NA 134* 137  K 4.1 3.8  CL 102 104  CO2 23 22  GLUCOSE 151* 133*  BUN 13 12  CREATININE 0.94 0.90  CALCIUM 8.5* 8.5*   PT/INR No results for input(s): LABPROT, INR in the last 72 hours. CMP     Component Value Date/Time   NA 137 05/27/2014 0445   K 3.8 05/27/2014 0445   CL 104 05/27/2014 0445   CO2 22 05/27/2014 0445   GLUCOSE 133* 05/27/2014 0445   BUN 12 05/27/2014 0445   CREATININE 0.90 05/27/2014 0445   CALCIUM 8.5* 05/27/2014 0445   PROT 8.0 05/25/2014 1529   ALBUMIN 4.6 05/25/2014 1529   AST 24 05/25/2014 1529   ALT 17 05/25/2014 1529   ALKPHOS 69 05/25/2014 1529   BILITOT 2.0* 05/25/2014 1529   GFRNONAA >60 05/27/2014 0445   GFRAA >60 05/27/2014 0445   Lipase     Component Value Date/Time   LIPASE 16* 05/25/2014 1529       Studies/Results: Ct Abdomen Pelvis W Contrast  05/25/2014   CLINICAL DATA:  Right lower quadrant pain, evaluate for appendicitis. Leukocytosis.  EXAM: CT  ABDOMEN AND PELVIS WITH CONTRAST  TECHNIQUE: Multidetector CT imaging of the abdomen and pelvis was performed using the standard protocol following bolus administration of intravenous contrast.  CONTRAST:  129mL OMNIPAQUE IOHEXOL 300 MG/ML  SOLN  COMPARISON:  None.  FINDINGS: Lower chest: Lung bases show scattered scarring or atelectasis. Heart size normal. Coronary artery calcification. No pericardial or pleural effusion.  Hepatobiliary: Liver and gallbladder are unremarkable. No biliary ductal dilatation.  Pancreas: Negative.  Spleen: Negative.  Adrenals/Urinary Tract: Adrenal glands and kidneys are unremarkable. Ureters are decompressed. Bladder is unremarkable.  Stomach/Bowel: Stomach and small bowel are unremarkable. Appendix is dilated, measuring up to 10 mm in diameter. There is periappendiceal fat stranding and haziness. Colon is unremarkable.  Vascular/Lymphatic: Atherosclerotic calcification of the arterial vasculature without abdominal aortic aneurysm. No pathologically enlarged lymph nodes.  Reproductive: Prostate is enlarged.  Other: Small scattered ascites. Mesenteries and peritoneum are otherwise unremarkable.  Musculoskeletal: No worrisome lytic or sclerotic lesions. Prominent Schmorl's node in the superior endplate of C78.  IMPRESSION: 1. Acute appendicitis without without evidence of rupture or abscess. 2. Scattered ascites. 3. Coronary artery calcification. 4. Enlarged prostate.   Electronically Signed   By: Lorin Picket M.D.   On: 05/25/2014 16:25   Dg Abd Acute W/chest  05/25/2014   CLINICAL DATA:  Diffuse abdominal  pain  EXAM: DG ABDOMEN ACUTE W/ 1V CHEST  COMPARISON:  None.  FINDINGS: Cardiac shadow is within normal limits. The lungs are clear bilaterally. No bony abnormality is seen.  The abdomen shows a nonobstructive bowel gas pattern. No abnormal mass or abnormal calcifications are noted. No acute bony abnormality is seen.  IMPRESSION: No acute abnormality noted.   Electronically  Signed   By: Inez Catalina M.D.   On: 05/25/2014 13:23    Anti-infectives: Anti-infectives    Start     Dose/Rate Route Frequency Ordered Stop   05/26/14 0200  piperacillin-tazobactam (ZOSYN) IVPB 3.375 g     3.375 g 12.5 mL/hr over 240 Minutes Intravenous Every 8 hours 05/25/14 2351     05/25/14 1745  piperacillin-tazobactam (ZOSYN) IVPB 3.375 g     3.375 g 12.5 mL/hr over 240 Minutes Intravenous  Once 05/25/14 1740 05/25/14 2012       Assessment/Plan  POD 2, s/p lap appy for perforated appendicitis -cont IV zosyn -WBC stable at 18K, follow with labs in the am -cont clears, but patient told to take it slow and sip on liquids.  If he develops emesis, may require NGT to be placed -cont to mobilize and pulm toilet -post op ileus, await bowel function -ambien for sleep  DVT prophylaxis SCD/ Lovenox   LOS: 2 days    Ramanda Paules E 05/27/2014, 8:27 AM Pager: [phone]

## 2014-05-28 LAB — CBC
HCT: 34 % — ABNORMAL LOW (ref 39.0–52.0)
Hemoglobin: 11.1 g/dL — ABNORMAL LOW (ref 13.0–17.0)
MCH: 27.3 pg (ref 26.0–34.0)
MCHC: 32.6 g/dL (ref 30.0–36.0)
MCV: 83.7 fL (ref 78.0–100.0)
Platelets: 233 10*3/uL (ref 150–400)
RBC: 4.06 MIL/uL — AB (ref 4.22–5.81)
RDW: 13.9 % (ref 11.5–15.5)
WBC: 12.7 10*3/uL — ABNORMAL HIGH (ref 4.0–10.5)

## 2014-05-28 MED ORDER — OXYCODONE-ACETAMINOPHEN 5-325 MG PO TABS
1.0000 | ORAL_TABLET | ORAL | Status: DC | PRN
Start: 2014-05-28 — End: 2014-05-29
  Administered 2014-05-28 – 2014-05-29 (×5): 2 via ORAL
  Filled 2014-05-28 (×5): qty 2

## 2014-05-28 NOTE — Progress Notes (Signed)
Patient ID: Antonio Acosta, male   DOB: 06/29/1950, 64 y.o.   MRN: [id number] 3 Days Post-Op  Subjective: No nausea.  Tolerated clear liquids.  Had a BM and flatus.  Has been walking.  Main complaint is fatigue.    Objective: Vital signs in last 24 hours: Temp:  [98.1 F (36.7 C)-99.1 F (37.3 C)] 99.1 F (37.3 C) (05/21 0524) Pulse Rate:  [81-86] 86 (05/21 0524) Resp:  [17-18] 18 (05/21 0524) BP: (126-134)/(70-78) 133/70 mmHg (05/21 0524) SpO2:  [97 %-98 %] 97 % (05/21 0524) Last BM Date: 05/28/14 (loose stool noted)  Intake/Output from previous day: 05/20 0701 - 05/21 0700 In: 2120 [P.O.:720; I.V.:1300; IV Piggyback:100] Out: 1800 [Urine:1800] Intake/Output this shift:    PE: Abd: soft, some distention, very few BS, incisions c/d/i, minimally tender Heart: regular Lungs: CTAB  Lab Results:   Recent Labs  05/27/14 0445 05/28/14 0515  WBC 18.4* 12.7*  HGB 12.5* 11.1*  HCT 38.0* 34.0*  PLT 237 233   BMET  Recent Labs  05/26/14 0450 05/27/14 0445  NA 134* 137  K 4.1 3.8  CL 102 104  CO2 23 22  GLUCOSE 151* 133*  BUN 13 12  CREATININE 0.94 0.90  CALCIUM 8.5* 8.5*   PT/INR No results for input(s): LABPROT, INR in the last 72 hours. CMP     Component Value Date/Time   NA 137 05/27/2014 0445   K 3.8 05/27/2014 0445   CL 104 05/27/2014 0445   CO2 22 05/27/2014 0445   GLUCOSE 133* 05/27/2014 0445   BUN 12 05/27/2014 0445   CREATININE 0.90 05/27/2014 0445   CALCIUM 8.5* 05/27/2014 0445   PROT 8.0 05/25/2014 1529   ALBUMIN 4.6 05/25/2014 1529   AST 24 05/25/2014 1529   ALT 17 05/25/2014 1529   ALKPHOS 69 05/25/2014 1529   BILITOT 2.0* 05/25/2014 1529   GFRNONAA >60 05/27/2014 0445   GFRAA >60 05/27/2014 0445   Lipase     Component Value Date/Time   LIPASE 16* 05/25/2014 1529       Studies/Results: No results found.  Anti-infectives: Anti-infectives    Start     Dose/Rate Route Frequency Ordered Stop   05/26/14 0200   piperacillin-tazobactam (ZOSYN) IVPB 3.375 g     3.375 g 12.5 mL/hr over 240 Minutes Intravenous Every 8 hours 05/25/14 2351     05/25/14 1745  piperacillin-tazobactam (ZOSYN) IVPB 3.375 g     3.375 g 12.5 mL/hr over 240 Minutes Intravenous  Once 05/25/14 1740 05/25/14 2012       Assessment/Plan  POD 3, s/p lap appy for perforated appendicitis -cont IV zosyn -WBC down -advance diet. -cont to mobilize and pulm toilet -post op ileus appears to be resolving.   Lorrin Mais for sleep  DVT prophylaxis SCD/ Lovenox   LOS: 3 days    Carlester Kasparek 05/28/2014, 10:08 AM

## 2014-05-29 MED ORDER — OXYCODONE-ACETAMINOPHEN 5-325 MG PO TABS: 1.0000 | ORAL_TABLET | ORAL | Status: DC | PRN

## 2014-05-29 MED ORDER — AMOXICILLIN-POT CLAVULANATE 875-125 MG PO TABS: 1.0000 | ORAL_TABLET | Freq: Two times a day (BID) | ORAL | Status: DC

## 2014-05-29 NOTE — Discharge Instructions (Signed)
[facility] ______[facility] °LAPAROSCOPIC SURGERY: POST OP INSTRUCTIONS °Always review your discharge instruction sheet given to you by the facility where your surgery was performed. °IF YOU HAVE DISABILITY OR FAMILY LEAVE FORMS, YOU MUST BRING THEM TO THE OFFICE FOR PROCESSING.   °DO NOT GIVE THEM TO YOUR DOCTOR. ° °1. A prescription for pain medication may be given to you upon discharge.  Take your pain medication as prescribed, if needed.  If narcotic pain medicine is not needed, then you may take acetaminophen (Tylenol) or ibuprofen (Advil) as needed. °2. Take your usually prescribed medications unless otherwise directed. °3. If you need a refill on your pain medication, please contact your pharmacy.  They will contact our office to request authorization. Prescriptions will not be filled after 5pm or on week-ends. °4. You should follow a light diet the first few days after arrival home, such as soup and crackers, etc.  Be sure to include lots of fluids daily. °5. Most patients will experience some swelling and bruising in the area of the incisions.  Ice packs will help.  Swelling and bruising can take several days to resolve.  °6. It is common to experience some constipation if taking pain medication after surgery.  Increasing fluid intake and taking a stool softener (such as Colace) will usually help or prevent this problem from occurring.  A mild laxative (Milk of Magnesia or Miralax) should be taken according to package instructions if there are no bowel movements after 48 hours. °7. Unless discharge instructions indicate otherwise, you may remove your bandages 24-48 hours after surgery, and you may shower at that time.  You may have steri-strips (small skin tapes) in place directly over the incision.  These strips should be left on the skin for 7-10 days.  If your surgeon used skin glue on the incision, you may shower in 24 hours.  The glue will flake off over the next 2-3 weeks.  Any sutures or  staples will be removed at the office during your follow-up visit. °8. ACTIVITIES:  You may resume regular (light) daily activities beginning the next day--such as daily self-care, walking, climbing stairs--gradually increasing activities as tolerated.  You may have sexual intercourse when it is comfortable.  Refrain from any heavy lifting or straining until approved by your doctor. °a. You may drive when you are no longer taking prescription pain medication, you can comfortably wear a seatbelt, and you can safely maneuver your car and apply brakes. °b. RETURN TO WORK:  __________________________________________________________ °9. You should see your doctor in the office for a follow-up appointment approximately 2-3 weeks after your surgery.  Make sure that you call for this appointment within a day or two after you arrive home to insure a convenient appointment time. °10. OTHER INSTRUCTIONS: __________________________________________________________________________________________________________________________ __________________________________________________________________________________________________________________________ °WHEN TO CALL YOUR DOCTOR: °1. Fever over 101.0 °2. Inability to urinate °3. Continued bleeding from incision. °4. Increased pain, redness, or drainage from the incision. °5. Increasing abdominal pain ° °The clinic staff is available to answer your questions during regular business hours.  Please don’t hesitate to call and ask to speak to one of the nurses for clinical concerns.  If you have a medical emergency, go to the nearest emergency room or call 911.  A surgeon from [facility] is always on call at the hospital. °[address], [address], [place], [place]  [address] ? [address] [address], , [place]   [address] °[phone] ? [phone] ? FAX [phone] °Web site:   [email or web address] °

## 2014-05-29 NOTE — Discharge Summary (Signed)
   Patient ID: Antonio Acosta [id number] 63 y.o. [birth year]  05/25/2014  Discharge date and time: 05/29/2014   Admitting Physician: Excell Seltzer T  Discharge Physician: Excell Seltzer T  Admission Diagnoses: Acute appendicitis, unspecified acute appendicitis type [K35.80]  Discharge Diagnoses: same  Operations: Procedure(s): APPENDECTOMY LAPAROSCOPIC  Admission Condition: poor  Discharged Condition: good  Indication for Admission: patient is a 64 year old male that presented with evidence of acute appendicitis confirmed by CT scan.Marland Kitchen He was admitted for emergency appendectomy  Hospital Course: patient was started on broad-spectrum IV antibiotics and was taken for emergency laparoscopic appendectomy by Dr. Marlou Starks. He was found to have significant localized peritonitis. Postoperatively he was maintained on IV antibiotics. He initially had an elevated white count as high as 18,000 and a clinical ileus postoperatively. Over several days this gradually improved. He was started on a clear liquid diet which she tolerated well. At the time of discharge his white count is decreasing. His abdomen is soft and nontender. Wounds healing well with staples in place. Tolerating a liquid diet and having flatus and bowel movements.   Disposition: Home  Patient Instructions:    Medication List    TAKE these medications        amoxicillin-clavulanate 875-125 MG per tablet  Commonly known as:  AUGMENTIN  Take 1 tablet by mouth 2 (two) times daily.     aspirin 81 MG tablet  Take 81 mg by mouth daily.     metoprolol succinate 100 MG 24 hr tablet  Commonly known as:  TOPROL-XL  Take 1 tablet (100 mg total) by mouth daily. Take with or immediately following a meal.     multivitamin with minerals Tabs tablet  Take 1 tablet by mouth daily.     oxyCODONE-acetaminophen 5-325 MG per tablet  Commonly known as:  PERCOCET/ROXICET  Take 1-2 tablets by mouth every 4 (four) hours as needed for  moderate pain.        Activity: no heavy lifting for 2 weeks Diet: advance diet gradually as tolerated Wound Care: none needed  Follow-up:  With Dr. Marlou Starks in 1 week.  Signed: Edward Jolly MD, FACS  05/29/2014, 11:33 AM

## 2014-05-29 NOTE — Progress Notes (Signed)
Patient ID: Antonio Acosta, male   DOB: 1950/03/27, 64 y.o.   MRN: [id number] 4 Days Post-Op  Subjective: Feels pretty well today. Just sore with movement. Tolerating a clear liquid diet without difficulty and has had flatus and bowel movements. Would like to go home.  Objective: Vital signs in last 24 hours: Temp:  [98.3 F (36.8 C)-99.1 F (37.3 C)] 98.5 F (36.9 C) (05/22 0515) Pulse Rate:  [88-95] 91 (05/22 0515) Resp:  [16-20] 16 (05/22 0515) BP: (138-168)/(78-95) 168/95 mmHg (05/22 0515) SpO2:  [95 %-97 %] 95 % (05/22 0515) Last BM Date: 05/28/14 (loose stool noted)  Intake/Output from previous day: 05/21 0701 - 05/22 0700 In: 4205.1 [P.O.:1180; I.V.:2825.1; IV Piggyback:200] Out: 1600 [Urine:1600] Intake/Output this shift: Total I/O In: -  Out: 500 [Urine:500]  General appearance: alert, cooperative and no distress GI: normal findings: soft, non-tender and without significant distention Incision/Wound: clean and dry staples in place  Lab Results:   Recent Labs  05/27/14 0445 05/28/14 0515  WBC 18.4* 12.7*  HGB 12.5* 11.1*  HCT 38.0* 34.0*  PLT 237 233   BMET  Recent Labs  05/27/14 0445  NA 137  K 3.8  CL 104  CO2 22  GLUCOSE 133*  BUN 12  CREATININE 0.90  CALCIUM 8.5*     Studies/Results: No results found.  Anti-infectives: Anti-infectives    Start     Dose/Rate Route Frequency Ordered Stop   05/26/14 0200  piperacillin-tazobactam (ZOSYN) IVPB 3.375 g     3.375 g 12.5 mL/hr over 240 Minutes Intravenous Every 8 hours 05/25/14 2351     05/25/14 1745  piperacillin-tazobactam (ZOSYN) IVPB 3.375 g     3.375 g 12.5 mL/hr over 240 Minutes Intravenous  Once 05/25/14 1740 05/25/14 2012      Assessment/Plan: s/p Procedure(s): APPENDECTOMY LAPAROSCOPIC Localized peritonitis. Continues to improve and appears ready for discharge. Plan follow-up oral antibiotics for one week.   LOS: 4 days    Kwame Ryland T 05/29/2014

## 2014-05-29 NOTE — Progress Notes (Signed)
Assessment unchanged. Pt verbalized understanding of dc instructions through teach back. Scripts x 1 given as provided by md. Discharged via wc to front entrance to meet awaiting vehicle to carry home. Accompanied by NT and wife.

## 2014-06-01 ENCOUNTER — Encounter (HOSPITAL_COMMUNITY): Payer: Self-pay | Admitting: General Surgery

## 2014-11-04 ENCOUNTER — Ambulatory Visit (INDEPENDENT_AMBULATORY_CARE_PROVIDER_SITE_OTHER): Payer: BLUE CROSS/BLUE SHIELD | Admitting: Ophthalmology

## 2014-11-04 DIAGNOSIS — H43813 Vitreous degeneration, bilateral: Secondary | ICD-10-CM

## 2014-11-04 DIAGNOSIS — I10 Essential (primary) hypertension: Secondary | ICD-10-CM | POA: Diagnosis not present

## 2014-11-04 DIAGNOSIS — H34831 Tributary (branch) retinal vein occlusion, right eye, with macular edema: Secondary | ICD-10-CM

## 2014-11-04 DIAGNOSIS — H2513 Age-related nuclear cataract, bilateral: Secondary | ICD-10-CM | POA: Diagnosis not present

## 2014-11-04 DIAGNOSIS — H35033 Hypertensive retinopathy, bilateral: Secondary | ICD-10-CM

## 2014-11-04 DIAGNOSIS — H33301 Unspecified retinal break, right eye: Secondary | ICD-10-CM | POA: Diagnosis not present

## 2014-11-04 DIAGNOSIS — D3132 Benign neoplasm of left choroid: Secondary | ICD-10-CM

## 2015-07-27 ENCOUNTER — Encounter: Payer: Self-pay | Admitting: Internal Medicine

## 2015-07-27 ENCOUNTER — Ambulatory Visit (INDEPENDENT_AMBULATORY_CARE_PROVIDER_SITE_OTHER): Payer: BLUE CROSS/BLUE SHIELD | Admitting: Internal Medicine

## 2015-07-27 ENCOUNTER — Other Ambulatory Visit (INDEPENDENT_AMBULATORY_CARE_PROVIDER_SITE_OTHER): Payer: BLUE CROSS/BLUE SHIELD

## 2015-07-27 VITALS — BP 158/90 | HR 71 | Temp 98.3°F | Resp 16 | Ht 72.0 in | Wt 192.0 lb

## 2015-07-27 DIAGNOSIS — Z0000 Encounter for general adult medical examination without abnormal findings: Secondary | ICD-10-CM

## 2015-07-27 DIAGNOSIS — R7303 Prediabetes: Secondary | ICD-10-CM

## 2015-07-27 DIAGNOSIS — I10 Essential (primary) hypertension: Secondary | ICD-10-CM

## 2015-07-27 DIAGNOSIS — Z23 Encounter for immunization: Secondary | ICD-10-CM | POA: Diagnosis not present

## 2015-07-27 DIAGNOSIS — E785 Hyperlipidemia, unspecified: Secondary | ICD-10-CM | POA: Diagnosis not present

## 2015-07-27 LAB — HEMOGLOBIN A1C: Hgb A1c MFr Bld: 5.9 % (ref 4.6–6.5)

## 2015-07-27 LAB — CBC WITH DIFFERENTIAL/PLATELET
BASOS ABS: 0 10*3/uL (ref 0.0–0.1)
Basophils Relative: 0.5 % (ref 0.0–3.0)
Eosinophils Absolute: 0.3 10*3/uL (ref 0.0–0.7)
Eosinophils Relative: 4 % (ref 0.0–5.0)
HCT: 39.4 % (ref 39.0–52.0)
Hemoglobin: 12.9 g/dL — ABNORMAL LOW (ref 13.0–17.0)
LYMPHS ABS: 2.5 10*3/uL (ref 0.7–4.0)
Lymphocytes Relative: 35.3 % (ref 12.0–46.0)
MCHC: 32.9 g/dL (ref 30.0–36.0)
MCV: 83.6 fl (ref 78.0–100.0)
MONO ABS: 0.7 10*3/uL (ref 0.1–1.0)
Monocytes Relative: 9.6 % (ref 3.0–12.0)
NEUTROS PCT: 50.6 % (ref 43.0–77.0)
Neutro Abs: 3.6 10*3/uL (ref 1.4–7.7)
PLATELETS: 257 10*3/uL (ref 150.0–400.0)
RBC: 4.71 Mil/uL (ref 4.22–5.81)
RDW: 13.9 % (ref 11.5–15.5)
WBC: 7.2 10*3/uL (ref 4.0–10.5)

## 2015-07-27 LAB — COMPREHENSIVE METABOLIC PANEL
ALK PHOS: 71 U/L (ref 39–117)
ALT: 17 U/L (ref 0–53)
AST: 26 U/L (ref 0–37)
Albumin: 4.6 g/dL (ref 3.5–5.2)
BILIRUBIN TOTAL: 0.9 mg/dL (ref 0.2–1.2)
BUN: 18 mg/dL (ref 6–23)
CO2: 26 mEq/L (ref 19–32)
CREATININE: 1.11 mg/dL (ref 0.40–1.50)
Calcium: 9.5 mg/dL (ref 8.4–10.5)
Chloride: 103 mEq/L (ref 96–112)
GFR: 71.13 mL/min (ref >60.00)
GLUCOSE: 101 mg/dL — AB (ref 70–99)
Potassium: 4.3 mEq/L (ref 3.5–5.1)
SODIUM: 138 meq/L (ref 135–145)
TOTAL PROTEIN: 7.6 g/dL (ref 6.0–8.3)

## 2015-07-27 LAB — FECAL OCCULT BLOOD, GUAIAC: Fecal Occult Blood: NEGATIVE

## 2015-07-27 LAB — LIPID PANEL
Cholesterol: 210 mg/dL — ABNORMAL HIGH (ref 0–200)
HDL: 47.9 mg/dL (ref >39.00)
LDL Cholesterol: 128 mg/dL — ABNORMAL HIGH (ref 0–99)
NONHDL: 162.24
Total CHOL/HDL Ratio: 4
Triglycerides: 172 mg/dL — ABNORMAL HIGH (ref 0.0–149.0)
VLDL: 34.4 mg/dL (ref 0.0–40.0)

## 2015-07-27 LAB — TSH: TSH: 4.14 u[IU]/mL (ref 0.35–4.50)

## 2015-07-27 LAB — PSA: PSA: 1.1 ng/mL (ref 0.10–4.00)

## 2015-07-27 MED ORDER — ROSUVASTATIN CALCIUM 5 MG PO TABS
5.0000 mg | ORAL_TABLET | Freq: Every day | ORAL | Status: DC
Start: 2015-07-27 — End: 2016-07-24

## 2015-07-27 MED ORDER — TELMISARTAN 40 MG PO TABS: 40.0000 mg | ORAL_TABLET | Freq: Every day | ORAL | Status: DC

## 2015-07-27 MED ORDER — METOPROLOL SUCCINATE ER 100 MG PO TB24: 100.0000 mg | ORAL_TABLET | Freq: Every day | ORAL | Status: DC

## 2015-07-27 NOTE — Patient Instructions (Signed)
Hypertension Hypertension, commonly called high blood pressure, is when the force of blood pumping through your arteries is too strong. Your arteries are the blood vessels that carry blood from your heart throughout your body. A blood pressure reading consists of a higher number over a lower number, such as 110/72. The higher number (systolic) is the pressure inside your arteries when your heart pumps. The lower number (diastolic) is the pressure inside your arteries when your heart relaxes. Ideally you want your blood pressure below 120/80. Hypertension forces your heart to work harder to pump blood. Your arteries may become narrow or stiff. Having untreated or uncontrolled hypertension can cause heart attack, stroke, kidney disease, and other problems. RISK FACTORS Some risk factors for high blood pressure are controllable. Others are not.  Risk factors you cannot control include:   Race. You may be at higher risk if you are African American.  Age. Risk increases with age.  Gender. Men are at higher risk than women before age 45 years. After age 65, women are at higher risk than men. Risk factors you can control include:  Not getting enough exercise or physical activity.  Being overweight.  Getting too much fat, sugar, calories, or salt in your diet.  Drinking too much alcohol. SIGNS AND SYMPTOMS Hypertension does not usually cause signs or symptoms. Extremely high blood pressure (hypertensive crisis) may cause headache, anxiety, shortness of breath, and nosebleed. DIAGNOSIS To check if you have hypertension, your health care provider will measure your blood pressure while you are seated, with your arm held at the level of your heart. It should be measured at least twice using the same arm. Certain conditions can cause a difference in blood pressure between your right and left arms. A blood pressure reading that is higher than normal on one occasion does not mean that you need treatment. If  it is not clear whether you have high blood pressure, you may be asked to return on a different day to have your blood pressure checked again. Or, you may be asked to monitor your blood pressure at home for 1 or more weeks. TREATMENT Treating high blood pressure includes making lifestyle changes and possibly taking medicine. Living a healthy lifestyle can help lower high blood pressure. You may need to change some of your habits. Lifestyle changes may include:  Following the DASH diet. This diet is high in fruits, vegetables, and whole grains. It is low in salt, red meat, and added sugars.  Keep your sodium intake below 2,300 mg per day.  Getting at least 30-45 minutes of aerobic exercise at least 4 times per week.  Losing weight if necessary.  Not smoking.  Limiting alcoholic beverages.  Learning ways to reduce stress. Your health care provider may prescribe medicine if lifestyle changes are not enough to get your blood pressure under control, and if one of the following is true:  You are 18-59 years of age and your systolic blood pressure is above 140.  You are 60 years of age or older, and your systolic blood pressure is above 150.  Your diastolic blood pressure is above 90.  You have diabetes, and your systolic blood pressure is over 140 or your diastolic blood pressure is over 90.  You have kidney disease and your blood pressure is above 140/90.  You have heart disease and your blood pressure is above 140/90. Your personal target blood pressure may vary depending on your medical conditions, your age, and other factors. HOME CARE INSTRUCTIONS    Have your blood pressure rechecked as directed by your health care provider.   Take medicines only as directed by your health care provider. Follow the directions carefully. Blood pressure medicines must be taken as prescribed. The medicine does not work as well when you skip doses. Skipping doses also puts you at risk for  problems.  Do not smoke.   Monitor your blood pressure at home as directed by your health care provider. SEEK MEDICAL CARE IF:   You think you are having a reaction to medicines taken.  You have recurrent headaches or feel dizzy.  You have swelling in your ankles.  You have trouble with your vision. SEEK IMMEDIATE MEDICAL CARE IF:  You develop a severe headache or confusion.  You have unusual weakness, numbness, or feel faint.  You have severe chest or abdominal pain.  You vomit repeatedly.  You have trouble breathing. MAKE SURE YOU:   Understand these instructions.  Will watch your condition.  Will get help right away if you are not doing well or get worse.   This information is not intended to replace advice given to you by your health care provider. Make sure you discuss any questions you have with your health care provider.   Document Released: 12/24/2004 Document Revised: 05/10/2014 Document Reviewed: 10/16/2012 Elsevier Interactive Patient Education 2016 [employer]  

## 2015-07-27 NOTE — Progress Notes (Signed)
Pre visit review using our clinic review tool, if applicable. No additional management support is needed unless otherwise documented below in the visit note. 

## 2015-07-27 NOTE — Progress Notes (Signed)
Subjective:  Patient ID: Antonio Acosta, male    DOB: 1950/12/15  Age: 65 y.o. MRN: [id number]  CC: Annual Exam; Hyperlipidemia; and Hypertension   HPI Antonio Acosta presents for a CPX.  He thinks his blood pressure has been well controlled, at home he gets numbers around 130-135/80. He has good exercise tolerance with no chest pain/shortness of breath/dyspnea on exertion/edema/palpitations/or fatigue.    Outpatient Medications Prior to Visit  Medication Sig Dispense Refill  . aspirin 81 MG tablet Take 81 mg by mouth daily.    . Multiple Vitamin (MULTIVITAMIN WITH MINERALS) TABS Take 1 tablet by mouth daily.    . metoprolol succinate (TOPROL-XL) 100 MG 24 hr tablet Take 1 tablet (100 mg total) by mouth daily. Take with or immediately following a meal. 90 tablet 3  . amoxicillin-clavulanate (AUGMENTIN) 875-125 MG per tablet Take 1 tablet by mouth 2 (two) times daily. 14 tablet 3  . oxyCODONE-acetaminophen (PERCOCET/ROXICET) 5-325 MG per tablet Take 1-2 tablets by mouth every 4 (four) hours as needed for moderate pain. 30 tablet 0   No facility-administered medications prior to visit.     ROS Review of Systems  Constitutional: Negative for activity change, appetite change and fatigue.  HENT: Negative.   Eyes: Negative for visual disturbance.  Respiratory: Negative.  Negative for cough, choking, chest tightness, shortness of breath, wheezing and stridor.   Cardiovascular: Negative.  Negative for chest pain, palpitations and leg swelling.  Gastrointestinal: Negative.  Negative for abdominal pain, blood in stool, constipation, diarrhea, nausea and vomiting.  Endocrine: Negative.   Genitourinary: Negative for difficulty urinating, dysuria, penile swelling, scrotal swelling and testicular pain.  Musculoskeletal: Negative.  Negative for arthralgias, joint swelling and myalgias.  Skin: Negative.  Negative for color change and rash.  Allergic/Immunologic: Negative.   Neurological:  Negative.  Negative for dizziness, tremors, weakness, light-headedness, numbness and headaches.  Hematological: Negative.   Psychiatric/Behavioral: Negative.     Objective:  BP (!) 158/90 Comment: BP (R) 152/82 (L) 156/86  Pulse 71   Temp 98.3 F (36.8 C) (Oral)   Resp 16   Ht 6' (1.829 m)   Wt 192 lb (87.1 kg)   SpO2 96%   BMI 26.04 kg/m    BP Readings from Last 3 Encounters:  07/27/15 (!) 158/90  05/29/14 (!) 168/95  05/25/14 130/80    Wt Readings from Last 3 Encounters:  07/27/15 192 lb (87.1 kg)  05/25/14 196 lb (88.9 kg)  05/25/14 196 lb (88.9 kg)    Physical Exam  Constitutional: He is oriented to person, place, and time. No distress.  HENT:  Mouth/Throat: Oropharynx is clear and moist. No oropharyngeal exudate.  Eyes: Conjunctivae are normal. Right eye exhibits no discharge. Left eye exhibits no discharge. No scleral icterus.  Neck: Normal range of motion. Neck supple. No JVD present. No tracheal deviation present. No thyromegaly present.  Cardiovascular: Normal rate, regular rhythm, normal heart sounds and intact distal pulses.  Exam reveals no gallop and no friction rub.   No murmur heard. Pulmonary/Chest: Effort normal and breath sounds normal. No stridor. No respiratory distress. He has no wheezes. He has no rales. He exhibits no tenderness.  Abdominal: Soft. Bowel sounds are normal. He exhibits no distension and no mass. There is no tenderness. There is no rebound and no guarding. Hernia confirmed negative in the right inguinal area and confirmed negative in the left inguinal area.  Genitourinary: Rectum normal, prostate normal, testes normal and penis normal. Rectal exam shows no external  hemorrhoid, no internal hemorrhoid, no fissure, no mass, no tenderness and anal tone normal. Guaiac negative stool. Prostate is not enlarged and not tender. Right testis shows no mass, no swelling and no tenderness. Right testis is descended. Left testis shows no mass, no swelling  and no tenderness. Left testis is descended. Circumcised. No penile erythema or penile tenderness. No discharge found.  Musculoskeletal: Normal range of motion. He exhibits no edema, tenderness or deformity.  Lymphadenopathy:    He has no cervical adenopathy.       Right: No inguinal adenopathy present.       Left: No inguinal adenopathy present.  Neurological: He is alert and oriented to person, place, and time. He has normal reflexes. He displays normal reflexes. He exhibits normal muscle tone. Coordination normal.  Skin: Skin is warm and dry. No rash noted. He is not diaphoretic. No erythema. No pallor.  Psychiatric: He has a normal mood and affect. His behavior is normal. Judgment and thought content normal.    Lab Results  Component Value Date   WBC 7.2 07/27/2015   HGB 12.9 (L) 07/27/2015   HCT 39.4 07/27/2015   PLT 257.0 07/27/2015   GLUCOSE 101 (H) 07/27/2015   CHOL 210 (H) 07/27/2015   TRIG 172.0 (H) 07/27/2015   HDL 47.90 07/27/2015   LDLDIRECT 137.6 09/11/2012   LDLCALC 128 (H) 07/27/2015   ALT 17 07/27/2015   AST 26 07/27/2015   NA 138 07/27/2015   K 4.3 07/27/2015   CL 103 07/27/2015   CREATININE 1.11 07/27/2015   BUN 18 07/27/2015   CO2 26 07/27/2015   TSH 4.14 07/27/2015   PSA 1.10 07/27/2015   INR 0.94 12/28/2011   HGBA1C 5.9 07/27/2015    Ct Abdomen Pelvis W Contrast  05/25/2014  CLINICAL DATA:  Right lower quadrant pain, evaluate for appendicitis. Leukocytosis. EXAM: CT ABDOMEN AND PELVIS WITH CONTRAST TECHNIQUE: Multidetector CT imaging of the abdomen and pelvis was performed using the standard protocol following bolus administration of intravenous contrast. CONTRAST:  135mL OMNIPAQUE IOHEXOL 300 MG/ML  SOLN COMPARISON:  None. FINDINGS: Lower chest: Lung bases show scattered scarring or atelectasis. Heart size normal. Coronary artery calcification. No pericardial or pleural effusion. Hepatobiliary: Liver and gallbladder are unremarkable. No biliary ductal  dilatation. Pancreas: Negative. Spleen: Negative. Adrenals/Urinary Tract: Adrenal glands and kidneys are unremarkable. Ureters are decompressed. Bladder is unremarkable. Stomach/Bowel: Stomach and small bowel are unremarkable. Appendix is dilated, measuring up to 10 mm in diameter. There is periappendiceal fat stranding and haziness. Colon is unremarkable. Vascular/Lymphatic: Atherosclerotic calcification of the arterial vasculature without abdominal aortic aneurysm. No pathologically enlarged lymph nodes. Reproductive: Prostate is enlarged. Other: Small scattered ascites. Mesenteries and peritoneum are otherwise unremarkable. Musculoskeletal: No worrisome lytic or sclerotic lesions. Prominent Schmorl's node in the superior endplate of 624THL. IMPRESSION: 1. Acute appendicitis without without evidence of rupture or abscess. 2. Scattered ascites. 3. Coronary artery calcification. 4. Enlarged prostate. Electronically Signed   By: Lorin Picket M.D.   On: 05/25/2014 16:25   Dg Abd Acute W/chest  05/25/2014  CLINICAL DATA:  Diffuse abdominal pain EXAM: DG ABDOMEN ACUTE W/ 1V CHEST COMPARISON:  None. FINDINGS: Cardiac shadow is within normal limits. The lungs are clear bilaterally. No bony abnormality is seen. The abdomen shows a nonobstructive bowel gas pattern. No abnormal mass or abnormal calcifications are noted. No acute bony abnormality is seen. IMPRESSION: No acute abnormality noted. Electronically Signed   By: Inez Catalina M.D.   On: 05/25/2014 13:23  Assessment & Plan:   Antonio Acosta was seen today for annual exam, hyperlipidemia and hypertension.  Diagnoses and all orders for this visit:  Prediabetes- His A1c is 5.9%, he agrees to work on his lifestyle modifications to lower his blood sugars. -     Hemoglobin A1c; Future  Routine general medical examination at a health care facility- exam completed, labs ordered and reviewed, vaccines were reviewed, his colonoscopy is up-to-date, patient education  material was given. -     Lipid panel; Future -     Comprehensive metabolic panel; Future -     CBC with Differential/Platelet; Future -     TSH; Future -     PSA; Future  Essential hypertension, benign- his blood pressure is not adequately well controlled so I will add an ARB to the beta blocker. -     telmisartan (MICARDIS) 40 MG tablet; Take 1 tablet (40 mg total) by mouth daily. -     metoprolol succinate (TOPROL-XL) 100 MG 24 hr tablet; Take 1 tablet (100 mg total) by mouth daily. Take with or immediately following a meal.  Hyperlipidemia with target LDL less than 130- his Framingham risk score is 16% so I have asked him to start a statin for cardiovascular risk reduction -     rosuvastatin (CRESTOR) 5 MG tablet; Take 1 tablet (5 mg total) by mouth daily.  Need for prophylactic vaccination with combined diphtheria-tetanus-pertussis (DTP) vaccine -     Tdap vaccine greater than or equal to 7yo IM   I have discontinued Mr. Dupuis's oxyCODONE-acetaminophen and amoxicillin-clavulanate. I am also having him start on telmisartan and rosuvastatin. Additionally, I am having him maintain his multivitamin with minerals, aspirin, and metoprolol succinate.  Meds ordered this encounter  Medications  . telmisartan (MICARDIS) 40 MG tablet    Sig: Take 1 tablet (40 mg total) by mouth daily.    Dispense:  90 tablet    Refill:  1  . metoprolol succinate (TOPROL-XL) 100 MG 24 hr tablet    Sig: Take 1 tablet (100 mg total) by mouth daily. Take with or immediately following a meal.    Dispense:  90 tablet    Refill:  3  . rosuvastatin (CRESTOR) 5 MG tablet    Sig: Take 1 tablet (5 mg total) by mouth daily.    Dispense:  90 tablet    Refill:  3     Follow-up: Return in about 2 months (around 09/27/2015).  Scarlette Calico, MD

## 2015-11-06 ENCOUNTER — Ambulatory Visit (INDEPENDENT_AMBULATORY_CARE_PROVIDER_SITE_OTHER): Payer: BLUE CROSS/BLUE SHIELD | Admitting: Ophthalmology

## 2015-11-06 DIAGNOSIS — H2513 Age-related nuclear cataract, bilateral: Secondary | ICD-10-CM

## 2015-11-06 DIAGNOSIS — I10 Essential (primary) hypertension: Secondary | ICD-10-CM

## 2015-11-06 DIAGNOSIS — H43813 Vitreous degeneration, bilateral: Secondary | ICD-10-CM

## 2015-11-06 DIAGNOSIS — H35033 Hypertensive retinopathy, bilateral: Secondary | ICD-10-CM

## 2015-11-06 DIAGNOSIS — H348312 Tributary (branch) retinal vein occlusion, right eye, stable: Secondary | ICD-10-CM | POA: Diagnosis not present

## 2015-11-06 DIAGNOSIS — D3132 Benign neoplasm of left choroid: Secondary | ICD-10-CM

## 2015-12-04 ENCOUNTER — Encounter (INDEPENDENT_AMBULATORY_CARE_PROVIDER_SITE_OTHER): Payer: BLUE CROSS/BLUE SHIELD | Admitting: Ophthalmology

## 2015-12-04 DIAGNOSIS — D3132 Benign neoplasm of left choroid: Secondary | ICD-10-CM | POA: Diagnosis not present

## 2015-12-04 DIAGNOSIS — I10 Essential (primary) hypertension: Secondary | ICD-10-CM

## 2015-12-04 DIAGNOSIS — H35033 Hypertensive retinopathy, bilateral: Secondary | ICD-10-CM | POA: Diagnosis not present

## 2015-12-04 DIAGNOSIS — H33301 Unspecified retinal break, right eye: Secondary | ICD-10-CM

## 2015-12-04 DIAGNOSIS — H34831 Tributary (branch) retinal vein occlusion, right eye, with macular edema: Secondary | ICD-10-CM | POA: Diagnosis not present

## 2015-12-04 DIAGNOSIS — H43813 Vitreous degeneration, bilateral: Secondary | ICD-10-CM | POA: Diagnosis not present

## 2015-12-04 DIAGNOSIS — H2513 Age-related nuclear cataract, bilateral: Secondary | ICD-10-CM

## 2015-12-22 ENCOUNTER — Encounter (INDEPENDENT_AMBULATORY_CARE_PROVIDER_SITE_OTHER): Payer: BLUE CROSS/BLUE SHIELD | Admitting: Ophthalmology

## 2015-12-22 DIAGNOSIS — I10 Essential (primary) hypertension: Secondary | ICD-10-CM

## 2015-12-22 DIAGNOSIS — H43813 Vitreous degeneration, bilateral: Secondary | ICD-10-CM

## 2015-12-22 DIAGNOSIS — D3132 Benign neoplasm of left choroid: Secondary | ICD-10-CM | POA: Diagnosis not present

## 2015-12-22 DIAGNOSIS — H34831 Tributary (branch) retinal vein occlusion, right eye, with macular edema: Secondary | ICD-10-CM | POA: Diagnosis not present

## 2015-12-22 DIAGNOSIS — H35033 Hypertensive retinopathy, bilateral: Secondary | ICD-10-CM | POA: Diagnosis not present

## 2016-01-19 ENCOUNTER — Encounter (INDEPENDENT_AMBULATORY_CARE_PROVIDER_SITE_OTHER): Payer: BLUE CROSS/BLUE SHIELD | Admitting: Ophthalmology

## 2016-01-19 DIAGNOSIS — H43813 Vitreous degeneration, bilateral: Secondary | ICD-10-CM | POA: Diagnosis not present

## 2016-01-19 DIAGNOSIS — I10 Essential (primary) hypertension: Secondary | ICD-10-CM | POA: Diagnosis not present

## 2016-01-19 DIAGNOSIS — D3132 Benign neoplasm of left choroid: Secondary | ICD-10-CM

## 2016-01-19 DIAGNOSIS — H34831 Tributary (branch) retinal vein occlusion, right eye, with macular edema: Secondary | ICD-10-CM | POA: Diagnosis not present

## 2016-01-19 DIAGNOSIS — H35033 Hypertensive retinopathy, bilateral: Secondary | ICD-10-CM

## 2016-02-01 IMAGING — CT CT ABD-PELV W/ CM
2 of 5 series · 16 of 46 positions shown, 18 images · IV contrast (OMNIPAQUE 300)
Comparison: None.

CLINICAL DATA: Right lower quadrant pain, evaluate for
appendicitis. Leukocytosis.

EXAM:
CT ABDOMEN AND PELVIS WITH CONTRAST
TECHNIQUE: Multidetector CT imaging of the abdomen and pelvis was performed
using the standard protocol following bolus administration of
intravenous contrast.
CONTRAST:  100mL OMNIPAQUE IOHEXOL 300 MG/ML  SOLN

[Series 2: abd/pel with · axial · 0.83mm/px · z∈[+1576,+2006]mm · 13 of 98 slices shown, 15 images]
[im 6/98  soft-tissue]
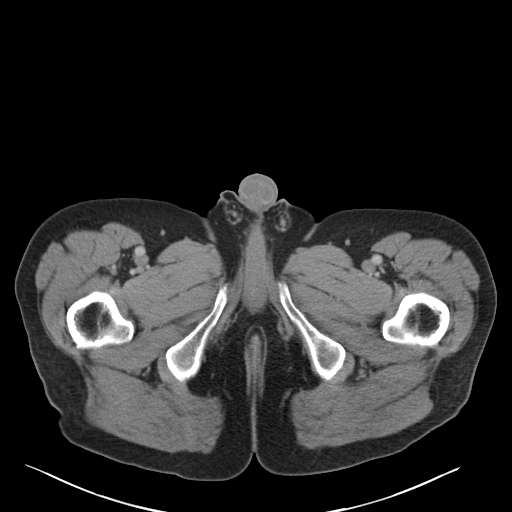
[im 6/98  bone]
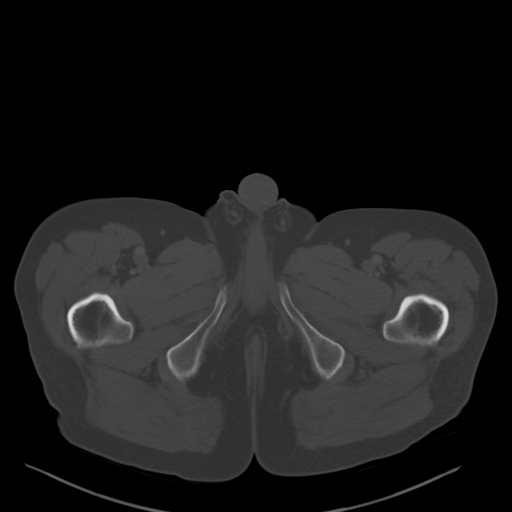
[im 16/98  soft-tissue]
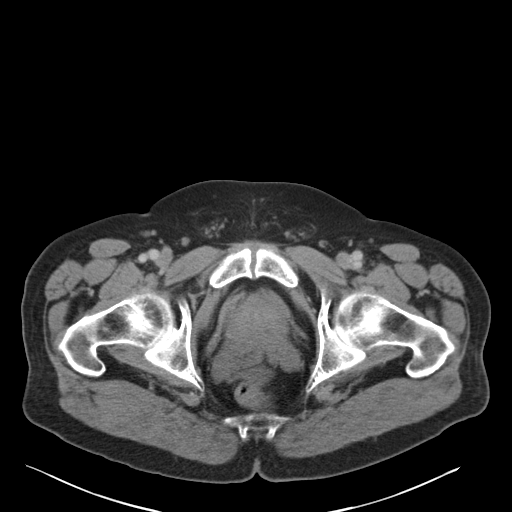
[im 21/98  soft-tissue]
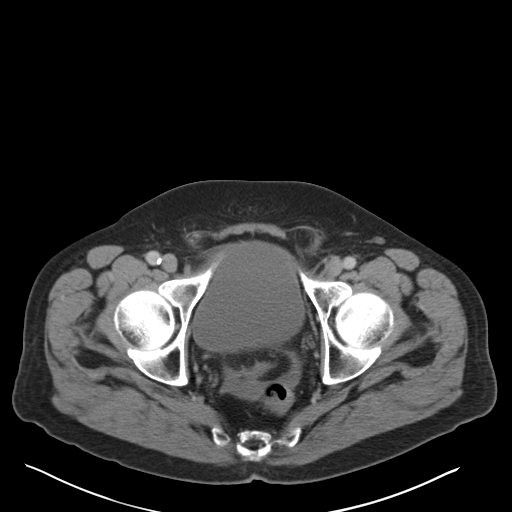
[im 26/98  soft-tissue]
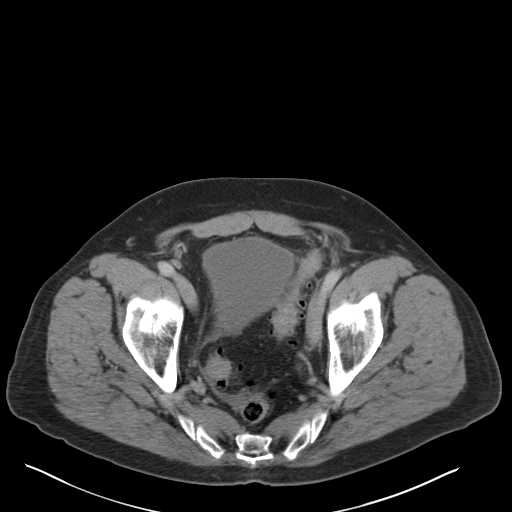
[im 36/98  soft-tissue]
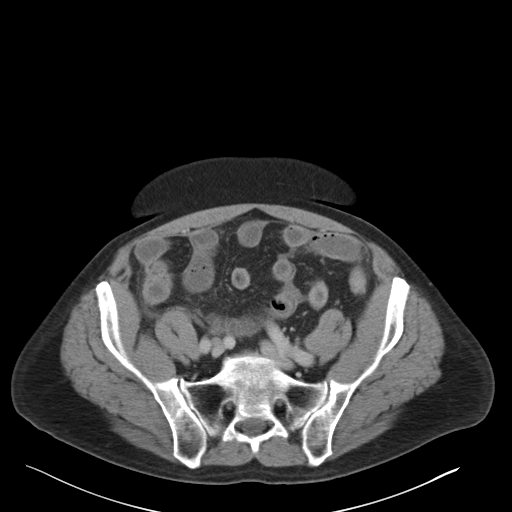
[im 41/98  soft-tissue]
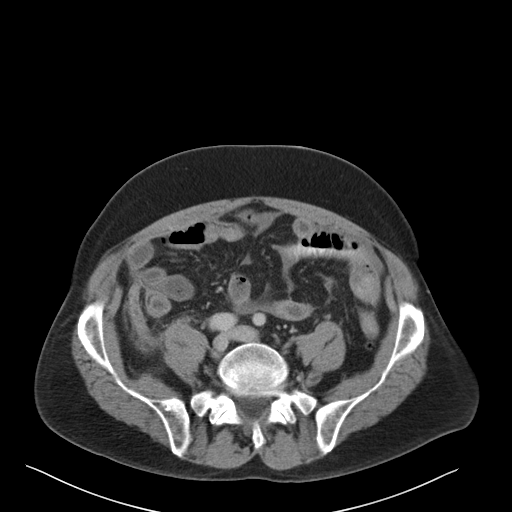
[im 52/98  soft-tissue]
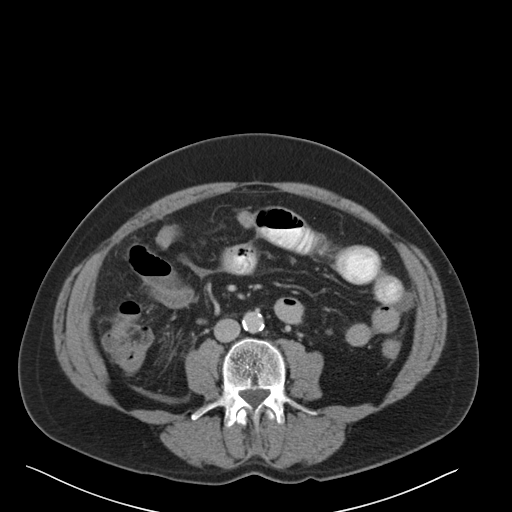
[im 57/98  soft-tissue]
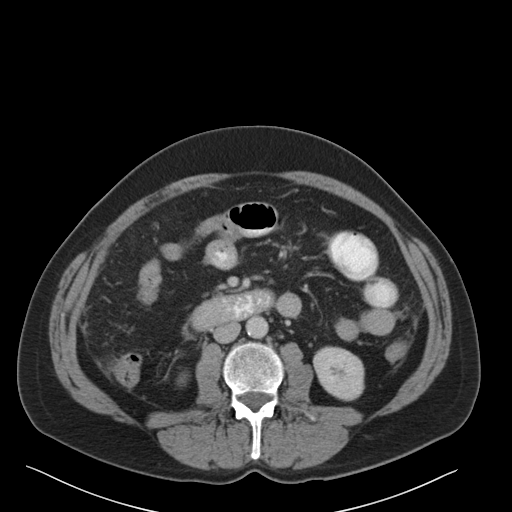
[im 62/98  soft-tissue]
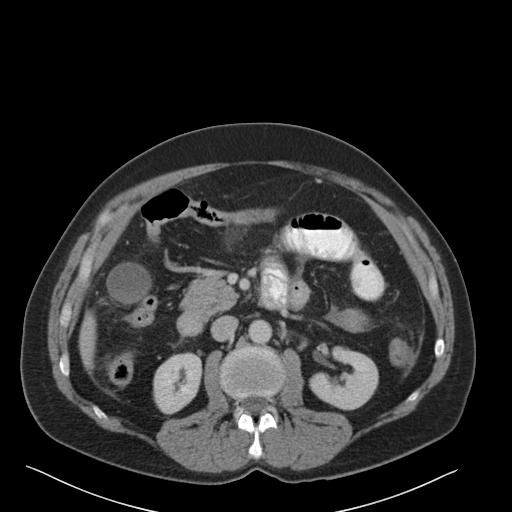
[im 62/98  bone]
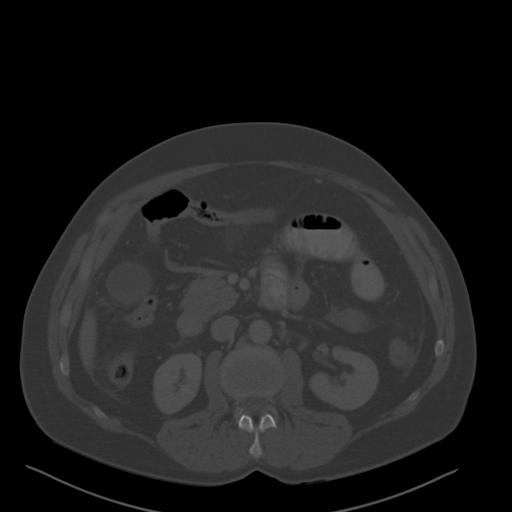
[im 72/98  soft-tissue]
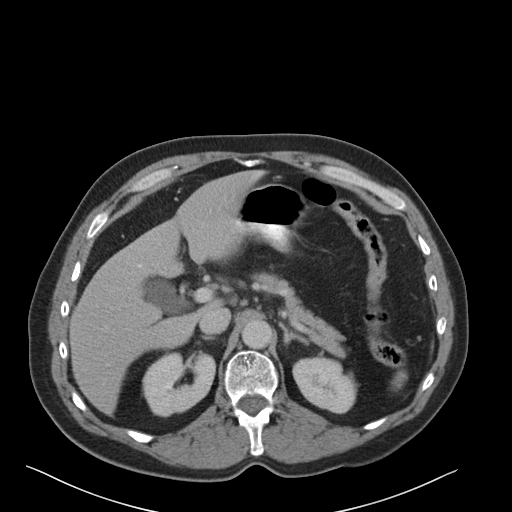
[im 77/98  soft-tissue]
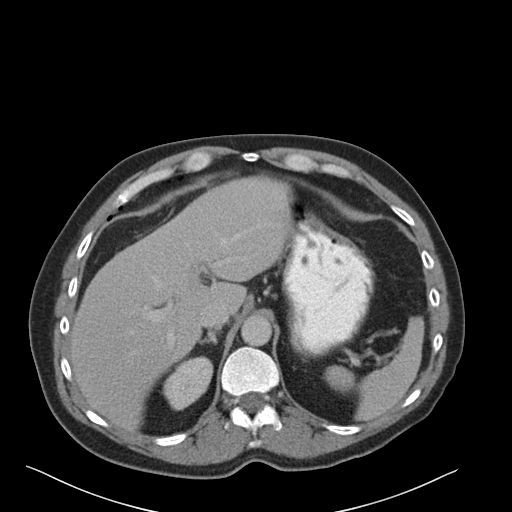
[im 82/98  soft-tissue]
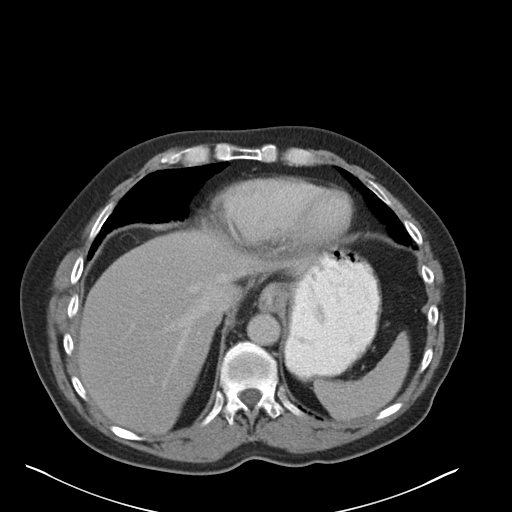
[im 92/98  soft-tissue]
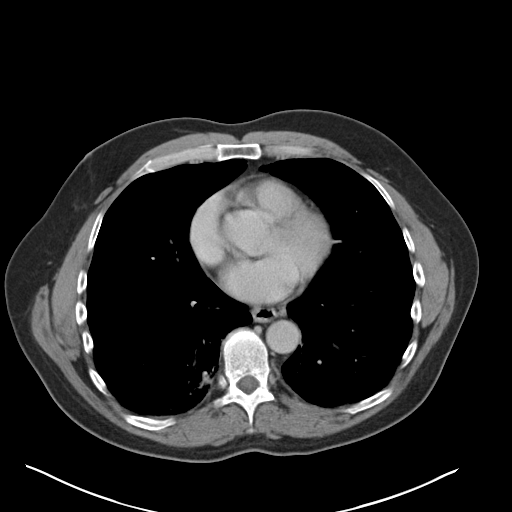

[Series 3: coronal a/|p · coronal · 0.74mm/px · 3 of 99 slices shown]
[im 33/99  soft-tissue]
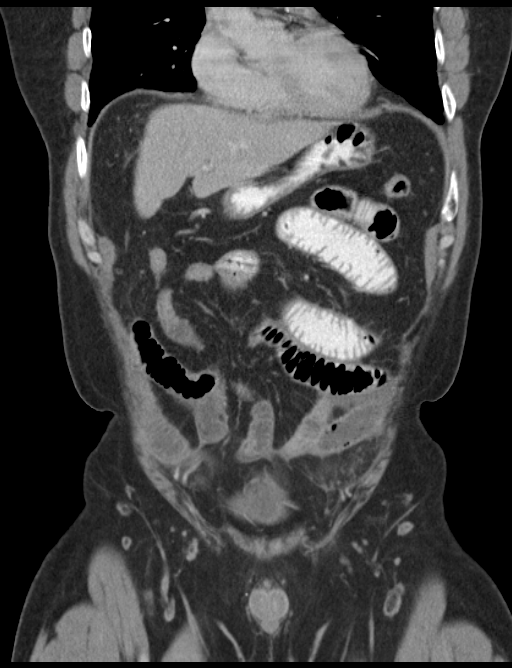
[im 44/99  soft-tissue]
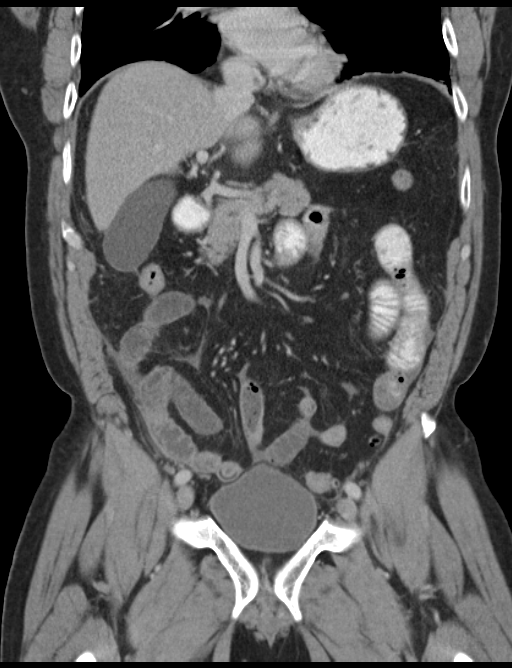
[im 55/99  soft-tissue]
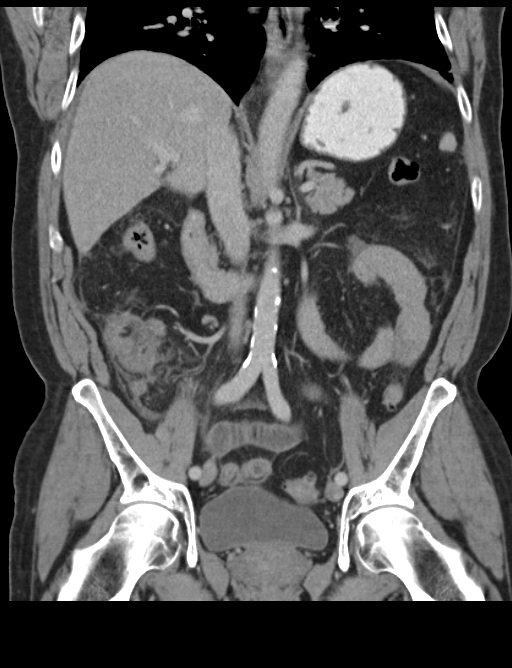

[16 of 46 positions shown; findings below may reference images not displayed]

FINDINGS: Lower chest: Lung bases show scattered scarring or atelectasis.
Heart size normal. Coronary artery calcification. No pericardial or
pleural effusion.

Hepatobiliary: Liver and gallbladder are unremarkable. No biliary
ductal dilatation.

Pancreas: Negative.

Spleen: Negative.

Adrenals/Urinary Tract: Adrenal glands and kidneys are unremarkable.
Ureters are decompressed. Bladder is unremarkable.

Stomach/Bowel: Stomach and small bowel are unremarkable. Appendix is
dilated, measuring up to 10 mm in diameter. There is periappendiceal
fat stranding and haziness. Colon is unremarkable.

Vascular/Lymphatic: Atherosclerotic calcification of the arterial
vasculature without abdominal aortic aneurysm. No pathologically
enlarged lymph nodes.

Reproductive: Prostate is enlarged.

Other: Small scattered ascites. Mesenteries and peritoneum are
otherwise unremarkable.

Musculoskeletal: No worrisome lytic or sclerotic lesions. Prominent
Schmorl's node in the superior endplate of T11.
IMPRESSION: 1. Acute appendicitis without without evidence of rupture or
abscess.
2. Scattered ascites.
3. Coronary artery calcification.
4. Enlarged prostate.

## 2016-02-03 ENCOUNTER — Other Ambulatory Visit: Payer: Self-pay | Admitting: Internal Medicine

## 2016-02-03 DIAGNOSIS — I10 Essential (primary) hypertension: Secondary | ICD-10-CM

## 2016-03-04 ENCOUNTER — Encounter (INDEPENDENT_AMBULATORY_CARE_PROVIDER_SITE_OTHER): Payer: BLUE CROSS/BLUE SHIELD | Admitting: Ophthalmology

## 2016-03-06 ENCOUNTER — Encounter (INDEPENDENT_AMBULATORY_CARE_PROVIDER_SITE_OTHER): Payer: BLUE CROSS/BLUE SHIELD | Admitting: Ophthalmology

## 2016-03-06 DIAGNOSIS — H33301 Unspecified retinal break, right eye: Secondary | ICD-10-CM | POA: Diagnosis not present

## 2016-03-06 DIAGNOSIS — I10 Essential (primary) hypertension: Secondary | ICD-10-CM

## 2016-03-06 DIAGNOSIS — D3132 Benign neoplasm of left choroid: Secondary | ICD-10-CM

## 2016-03-06 DIAGNOSIS — H35033 Hypertensive retinopathy, bilateral: Secondary | ICD-10-CM

## 2016-03-06 DIAGNOSIS — H34831 Tributary (branch) retinal vein occlusion, right eye, with macular edema: Secondary | ICD-10-CM | POA: Diagnosis not present

## 2016-03-06 DIAGNOSIS — H43813 Vitreous degeneration, bilateral: Secondary | ICD-10-CM | POA: Diagnosis not present

## 2016-03-06 DIAGNOSIS — H2513 Age-related nuclear cataract, bilateral: Secondary | ICD-10-CM | POA: Diagnosis not present

## 2016-04-17 ENCOUNTER — Encounter (INDEPENDENT_AMBULATORY_CARE_PROVIDER_SITE_OTHER): Payer: BLUE CROSS/BLUE SHIELD | Admitting: Ophthalmology

## 2016-04-17 DIAGNOSIS — H35033 Hypertensive retinopathy, bilateral: Secondary | ICD-10-CM | POA: Diagnosis not present

## 2016-04-17 DIAGNOSIS — H43813 Vitreous degeneration, bilateral: Secondary | ICD-10-CM

## 2016-04-17 DIAGNOSIS — I10 Essential (primary) hypertension: Secondary | ICD-10-CM

## 2016-04-17 DIAGNOSIS — D3132 Benign neoplasm of left choroid: Secondary | ICD-10-CM | POA: Diagnosis not present

## 2016-04-17 DIAGNOSIS — H34831 Tributary (branch) retinal vein occlusion, right eye, with macular edema: Secondary | ICD-10-CM

## 2016-04-17 DIAGNOSIS — H2513 Age-related nuclear cataract, bilateral: Secondary | ICD-10-CM

## 2016-04-17 DIAGNOSIS — H33301 Unspecified retinal break, right eye: Secondary | ICD-10-CM

## 2016-05-29 ENCOUNTER — Encounter (INDEPENDENT_AMBULATORY_CARE_PROVIDER_SITE_OTHER): Payer: BLUE CROSS/BLUE SHIELD | Admitting: Ophthalmology

## 2016-05-29 DIAGNOSIS — I10 Essential (primary) hypertension: Secondary | ICD-10-CM

## 2016-05-29 DIAGNOSIS — H34831 Tributary (branch) retinal vein occlusion, right eye, with macular edema: Secondary | ICD-10-CM

## 2016-05-29 DIAGNOSIS — D3132 Benign neoplasm of left choroid: Secondary | ICD-10-CM

## 2016-05-29 DIAGNOSIS — H43813 Vitreous degeneration, bilateral: Secondary | ICD-10-CM

## 2016-05-29 DIAGNOSIS — H33301 Unspecified retinal break, right eye: Secondary | ICD-10-CM

## 2016-05-29 DIAGNOSIS — H35033 Hypertensive retinopathy, bilateral: Secondary | ICD-10-CM

## 2016-05-29 DIAGNOSIS — H2513 Age-related nuclear cataract, bilateral: Secondary | ICD-10-CM

## 2016-07-12 ENCOUNTER — Encounter (INDEPENDENT_AMBULATORY_CARE_PROVIDER_SITE_OTHER): Payer: BLUE CROSS/BLUE SHIELD | Admitting: Ophthalmology

## 2016-07-12 DIAGNOSIS — D3132 Benign neoplasm of left choroid: Secondary | ICD-10-CM

## 2016-07-12 DIAGNOSIS — H43813 Vitreous degeneration, bilateral: Secondary | ICD-10-CM

## 2016-07-12 DIAGNOSIS — I10 Essential (primary) hypertension: Secondary | ICD-10-CM

## 2016-07-12 DIAGNOSIS — H34831 Tributary (branch) retinal vein occlusion, right eye, with macular edema: Secondary | ICD-10-CM

## 2016-07-12 DIAGNOSIS — H35033 Hypertensive retinopathy, bilateral: Secondary | ICD-10-CM

## 2016-07-24 ENCOUNTER — Ambulatory Visit (INDEPENDENT_AMBULATORY_CARE_PROVIDER_SITE_OTHER): Payer: BLUE CROSS/BLUE SHIELD | Admitting: Internal Medicine

## 2016-07-24 ENCOUNTER — Encounter: Payer: Self-pay | Admitting: Internal Medicine

## 2016-07-24 ENCOUNTER — Other Ambulatory Visit (INDEPENDENT_AMBULATORY_CARE_PROVIDER_SITE_OTHER): Payer: BLUE CROSS/BLUE SHIELD

## 2016-07-24 VITALS — BP 124/80 | HR 62 | Temp 98.5°F | Ht 72.0 in | Wt 190.0 lb

## 2016-07-24 DIAGNOSIS — R946 Abnormal results of thyroid function studies: Secondary | ICD-10-CM

## 2016-07-24 DIAGNOSIS — Z0000 Encounter for general adult medical examination without abnormal findings: Secondary | ICD-10-CM | POA: Diagnosis not present

## 2016-07-24 DIAGNOSIS — E785 Hyperlipidemia, unspecified: Secondary | ICD-10-CM | POA: Diagnosis not present

## 2016-07-24 DIAGNOSIS — I10 Essential (primary) hypertension: Secondary | ICD-10-CM | POA: Diagnosis not present

## 2016-07-24 DIAGNOSIS — R7303 Prediabetes: Secondary | ICD-10-CM

## 2016-07-24 DIAGNOSIS — K635 Polyp of colon: Secondary | ICD-10-CM | POA: Insufficient documentation

## 2016-07-24 DIAGNOSIS — R7989 Other specified abnormal findings of blood chemistry: Secondary | ICD-10-CM

## 2016-07-24 LAB — CBC WITH DIFFERENTIAL/PLATELET
BASOS ABS: 0.1 10*3/uL (ref 0.0–0.1)
BASOS PCT: 0.9 % (ref 0.0–3.0)
EOS ABS: 0.3 10*3/uL (ref 0.0–0.7)
Eosinophils Relative: 4.3 % (ref 0.0–5.0)
HCT: 39.7 % (ref 39.0–52.0)
Hemoglobin: 13.1 g/dL (ref 13.0–17.0)
Lymphocytes Relative: 31.9 % (ref 12.0–46.0)
Lymphs Abs: 2.3 10*3/uL (ref 0.7–4.0)
MCHC: 32.9 g/dL (ref 30.0–36.0)
MCV: 85.9 fl (ref 78.0–100.0)
MONO ABS: 0.7 10*3/uL (ref 0.1–1.0)
Monocytes Relative: 9.5 % (ref 3.0–12.0)
NEUTROS ABS: 3.9 10*3/uL (ref 1.4–7.7)
Neutrophils Relative %: 53.4 % (ref 43.0–77.0)
PLATELETS: 243 10*3/uL (ref 150.0–400.0)
RBC: 4.62 Mil/uL (ref 4.22–5.81)
RDW: 13.6 % (ref 11.5–15.5)
WBC: 7.4 10*3/uL (ref 4.0–10.5)

## 2016-07-24 LAB — COMPREHENSIVE METABOLIC PANEL
ALT: 22 U/L (ref 0–53)
AST: 29 U/L (ref 0–37)
Albumin: 4.7 g/dL (ref 3.5–5.2)
Alkaline Phosphatase: 65 U/L (ref 39–117)
BUN: 13 mg/dL (ref 6–23)
CHLORIDE: 104 meq/L (ref 96–112)
CO2: 28 meq/L (ref 19–32)
CREATININE: 0.99 mg/dL (ref 0.40–1.50)
Calcium: 9.6 mg/dL (ref 8.4–10.5)
GFR: 80.91 mL/min (ref >60.00)
GLUCOSE: 105 mg/dL — AB (ref 70–99)
Potassium: 4.3 mEq/L (ref 3.5–5.1)
SODIUM: 138 meq/L (ref 135–145)
Total Bilirubin: 0.6 mg/dL (ref 0.2–1.2)
Total Protein: 7 g/dL (ref 6.0–8.3)

## 2016-07-24 LAB — URINALYSIS, ROUTINE W REFLEX MICROSCOPIC
Bilirubin Urine: NEGATIVE
Hgb urine dipstick: NEGATIVE
Ketones, ur: NEGATIVE
Leukocytes, UA: NEGATIVE
NITRITE: NEGATIVE
PH: 6 (ref 5.0–8.0)
RBC / HPF: NONE SEEN (ref 0–?)
SPECIFIC GRAVITY, URINE: 1.015 (ref 1.000–1.030)
TOTAL PROTEIN, URINE-UPE24: NEGATIVE
URINE GLUCOSE: NEGATIVE
Urobilinogen, UA: 0.2 (ref 0.0–1.0)
WBC, UA: NONE SEEN (ref 0–?)

## 2016-07-24 LAB — HEMOGLOBIN A1C: HEMOGLOBIN A1C: 6.1 % (ref 4.6–6.5)

## 2016-07-24 LAB — LIPID PANEL
CHOL/HDL RATIO: 3
Cholesterol: 144 mg/dL (ref 0–200)
HDL: 55.6 mg/dL (ref >39.00)
LDL CALC: 66 mg/dL (ref 0–99)
NONHDL: 88.19
Triglycerides: 113 mg/dL (ref 0.0–149.0)
VLDL: 22.6 mg/dL (ref 0.0–40.0)

## 2016-07-24 LAB — TSH: TSH: 7.15 u[IU]/mL — AB (ref 0.35–4.50)

## 2016-07-24 LAB — PSA: PSA: 1.2 ng/mL (ref 0.10–4.00)

## 2016-07-24 MED ORDER — TELMISARTAN 40 MG PO TABS: 40.0000 mg | ORAL_TABLET | Freq: Every day | ORAL | 3 refills | Status: DC

## 2016-07-24 MED ORDER — METOPROLOL SUCCINATE ER 100 MG PO TB24: 100.0000 mg | ORAL_TABLET | Freq: Every day | ORAL | 3 refills | Status: DC

## 2016-07-24 MED ORDER — ROSUVASTATIN CALCIUM 5 MG PO TABS: 5.0000 mg | ORAL_TABLET | Freq: Every day | ORAL | 3 refills | Status: DC

## 2016-07-24 NOTE — Patient Instructions (Signed)

## 2016-07-24 NOTE — Progress Notes (Signed)
Subjective:  Patient ID: Antonio Acosta, male    DOB: Jul 29, 1950  Age: 66 y.o. MRN: [id number]  CC: Annual Exam; Hypertension; and Hyperlipidemia   HPI Nicolai Labonte presents for a CPX.  He has felt well lately and tells me his blood pressure has been well controlled. He is very active and has had no recent episodes of CP, DOE, SOB, palpitations, edema, or fatigue.  Outpatient Medications Prior to Visit  Medication Sig Dispense Refill  . aspirin 81 MG tablet Take 81 mg by mouth daily.    . Multiple Vitamin (MULTIVITAMIN WITH MINERALS) TABS Take 1 tablet by mouth daily.    . metoprolol succinate (TOPROL-XL) 100 MG 24 hr tablet Take 1 tablet (100 mg total) by mouth daily. Take with or immediately following a meal. 90 tablet 3  . rosuvastatin (CRESTOR) 5 MG tablet Take 1 tablet (5 mg total) by mouth daily. 90 tablet 3  . telmisartan (MICARDIS) 40 MG tablet TAKE ONE TABLET BY MOUTH DAILY 90 tablet 1   No facility-administered medications prior to visit.     ROS Review of Systems  Constitutional: Negative.  Negative for activity change, appetite change, diaphoresis, fatigue and unexpected weight change.  HENT: Negative.   Eyes: Negative for visual disturbance.  Respiratory: Negative.  Negative for cough, chest tightness, shortness of breath, wheezing and stridor.   Cardiovascular: Negative for chest pain, palpitations and leg swelling.  Gastrointestinal: Negative for abdominal pain, constipation, diarrhea, nausea and vomiting.  Endocrine: Negative for cold intolerance and heat intolerance.  Genitourinary: Negative.  Negative for difficulty urinating, dysuria, frequency, penile swelling, scrotal swelling, testicular pain and urgency.  Musculoskeletal: Negative.  Negative for back pain and neck pain.  Skin: Negative.   Allergic/Immunologic: Negative.   Neurological: Negative.  Negative for dizziness, weakness and headaches.  Hematological: Negative.  Negative for adenopathy. Does not  bruise/bleed easily.  Psychiatric/Behavioral: Negative.     Objective:  BP 124/80 (BP Location: Left Arm, Patient Position: Sitting, Cuff Size: Normal)   Pulse 62   Temp 98.5 F (36.9 C) (Oral)   Ht 6' (1.829 m)   Wt 190 lb (86.2 kg)   SpO2 100%   BMI 25.77 kg/m   BP Readings from Last 3 Encounters:  07/24/16 124/80  07/27/15 (!) 158/90  05/29/14 (!) 168/95    Wt Readings from Last 3 Encounters:  07/24/16 190 lb (86.2 kg)  07/27/15 192 lb (87.1 kg)  05/25/14 196 lb (88.9 kg)    Physical Exam  Constitutional: No distress.  HENT:  Mouth/Throat: Oropharynx is clear and moist. No oropharyngeal exudate.  Eyes: Conjunctivae are normal. Right eye exhibits no discharge. Left eye exhibits no discharge. No scleral icterus.  Neck: Normal range of motion. Neck supple. No JVD present. No thyromegaly present.  Cardiovascular: Normal rate, regular rhythm and intact distal pulses.  Exam reveals no gallop and no friction rub.   No murmur heard. EKG - Sinus  Rhythm  Low voltage in limb leads.   ABNORMAL - no change from prior EKG  Pulmonary/Chest: Effort normal and breath sounds normal. No respiratory distress. He has no wheezes. He has no rales. He exhibits no tenderness.  Abdominal: Soft. Bowel sounds are normal. He exhibits no distension and no mass. There is no tenderness. There is no rebound and no guarding. Hernia confirmed negative in the right inguinal area and confirmed negative in the left inguinal area.  Genitourinary: Rectum normal, testes normal and penis normal. Rectal exam shows no external hemorrhoid, no internal  hemorrhoid, no fissure, no mass, no tenderness, anal tone normal and guaiac negative stool. Prostate is not enlarged and not tender. Right testis shows no mass, no swelling and no tenderness. Right testis is descended. Left testis shows no mass, no swelling and no tenderness. Left testis is descended. Circumcised. No penile erythema or penile tenderness. No discharge  found.  Lymphadenopathy:    He has no cervical adenopathy.       Right: No inguinal adenopathy present.       Left: No inguinal adenopathy present.  Skin: He is not diaphoretic.  Vitals reviewed.   Lab Results  Component Value Date   WBC 7.4 07/24/2016   HGB 13.1 07/24/2016   HCT 39.7 07/24/2016   PLT 243.0 07/24/2016   GLUCOSE 105 (H) 07/24/2016   CHOL 144 07/24/2016   TRIG 113.0 07/24/2016   HDL 55.60 07/24/2016   LDLDIRECT 137.6 09/11/2012   LDLCALC 66 07/24/2016   ALT 22 07/24/2016   AST 29 07/24/2016   NA 138 07/24/2016   K 4.3 07/24/2016   CL 104 07/24/2016   CREATININE 0.99 07/24/2016   BUN 13 07/24/2016   CO2 28 07/24/2016   TSH 7.15 (H) 07/24/2016   PSA 1.20 07/24/2016   INR 0.94 12/28/2011   HGBA1C 6.1 07/24/2016    Ct Abdomen Pelvis W Contrast  Result Date: 05/25/2014 CLINICAL DATA:  Right lower quadrant pain, evaluate for appendicitis. Leukocytosis. EXAM: CT ABDOMEN AND PELVIS WITH CONTRAST TECHNIQUE: Multidetector CT imaging of the abdomen and pelvis was performed using the standard protocol following bolus administration of intravenous contrast. CONTRAST:  116mL OMNIPAQUE IOHEXOL 300 MG/ML  SOLN COMPARISON:  None. FINDINGS: Lower chest: Lung bases show scattered scarring or atelectasis. Heart size normal. Coronary artery calcification. No pericardial or pleural effusion. Hepatobiliary: Liver and gallbladder are unremarkable. No biliary ductal dilatation. Pancreas: Negative. Spleen: Negative. Adrenals/Urinary Tract: Adrenal glands and kidneys are unremarkable. Ureters are decompressed. Bladder is unremarkable. Stomach/Bowel: Stomach and small bowel are unremarkable. Appendix is dilated, measuring up to 10 mm in diameter. There is periappendiceal fat stranding and haziness. Colon is unremarkable. Vascular/Lymphatic: Atherosclerotic calcification of the arterial vasculature without abdominal aortic aneurysm. No pathologically enlarged lymph nodes. Reproductive:  Prostate is enlarged. Other: Small scattered ascites. Mesenteries and peritoneum are otherwise unremarkable. Musculoskeletal: No worrisome lytic or sclerotic lesions. Prominent Schmorl's node in the superior endplate of J50. IMPRESSION: 1. Acute appendicitis without without evidence of rupture or abscess. 2. Scattered ascites. 3. Coronary artery calcification. 4. Enlarged prostate. Electronically Signed   By: Lorin Picket M.D.   On: 05/25/2014 16:25   Dg Abd Acute W/chest  Result Date: 05/25/2014 CLINICAL DATA:  Diffuse abdominal pain EXAM: DG ABDOMEN ACUTE W/ 1V CHEST COMPARISON:  None. FINDINGS: Cardiac shadow is within normal limits. The lungs are clear bilaterally. No bony abnormality is seen. The abdomen shows a nonobstructive bowel gas pattern. No abnormal mass or abnormal calcifications are noted. No acute bony abnormality is seen. IMPRESSION: No acute abnormality noted. Electronically Signed   By: Inez Catalina M.D.   On: 05/25/2014 13:23    Assessment & Plan:   Makhari was seen today for annual exam, hypertension and hyperlipidemia.  Diagnoses and all orders for this visit:  Prediabetes- Improvement noted with his lifestyle modifications. -     Hemoglobin A1c; Future  Essential hypertension, benign- his BP is well-controlled, EKG is negative for LVH, lites and renal function are normal. Will continue the ARB and beta blocker -     CBC  with Differential/Platelet; Future -     Comprehensive metabolic panel; Future -     Urinalysis, Routine w reflex microscopic; Future -     EKG 12-Lead -     metoprolol succinate (TOPROL-XL) 100 MG 24 hr tablet; Take 1 tablet (100 mg total) by mouth daily. Take with or immediately following a meal. -     telmisartan (MICARDIS) 40 MG tablet; Take 1 tablet (40 mg total) by mouth daily.  Routine general medical examination at a health care facility- exam completed, labs ordered and reviewed, he did not want to receive a pneumonia vaccine, he is referred  for follow-up colonoscopy, patient education material was given. -     Lipid panel; Future -     PSA; Future -     EKG 12-Lead  Hyperlipidemia with target LDL less than 130- he has achieved his LDL goal and is doing well on the statin. -     TSH; Future -     rosuvastatin (CRESTOR) 5 MG tablet; Take 1 tablet (5 mg total) by mouth daily.  Polyp of colon, unspecified part of colon, unspecified type -     Ambulatory referral to Gastroenterology  TSH elevation- his TSH has risen to 7.15. I've asked him to return for an office visit in 6 weeks to recheck his TSH level and to evaluate for hypothyroidism.   I have changed Mr. Speedy's telmisartan. I am also having him maintain his multivitamin with minerals, aspirin, brimonidine, dorzolamide, metoprolol succinate, and rosuvastatin.  Meds ordered this encounter  Medications  . brimonidine (ALPHAGAN) 0.15 % ophthalmic solution  . dorzolamide (TRUSOPT) 2 % ophthalmic solution    Refill:  5  . metoprolol succinate (TOPROL-XL) 100 MG 24 hr tablet    Sig: Take 1 tablet (100 mg total) by mouth daily. Take with or immediately following a meal.    Dispense:  90 tablet    Refill:  3  . rosuvastatin (CRESTOR) 5 MG tablet    Sig: Take 1 tablet (5 mg total) by mouth daily.    Dispense:  90 tablet    Refill:  3  . telmisartan (MICARDIS) 40 MG tablet    Sig: Take 1 tablet (40 mg total) by mouth daily.    Dispense:  90 tablet    Refill:  3     Follow-up: Return in about 1 year (around 07/24/2017).  Scarlette Calico, MD

## 2016-07-25 ENCOUNTER — Encounter: Payer: Self-pay | Admitting: Internal Medicine

## 2016-07-25 DIAGNOSIS — R7989 Other specified abnormal findings of blood chemistry: Secondary | ICD-10-CM | POA: Insufficient documentation

## 2016-08-23 ENCOUNTER — Encounter (INDEPENDENT_AMBULATORY_CARE_PROVIDER_SITE_OTHER): Payer: BLUE CROSS/BLUE SHIELD | Admitting: Ophthalmology

## 2016-08-23 DIAGNOSIS — H43813 Vitreous degeneration, bilateral: Secondary | ICD-10-CM | POA: Diagnosis not present

## 2016-08-23 DIAGNOSIS — I10 Essential (primary) hypertension: Secondary | ICD-10-CM

## 2016-08-23 DIAGNOSIS — H34831 Tributary (branch) retinal vein occlusion, right eye, with macular edema: Secondary | ICD-10-CM

## 2016-08-23 DIAGNOSIS — H35033 Hypertensive retinopathy, bilateral: Secondary | ICD-10-CM

## 2016-09-05 ENCOUNTER — Ambulatory Visit (INDEPENDENT_AMBULATORY_CARE_PROVIDER_SITE_OTHER): Payer: BLUE CROSS/BLUE SHIELD | Admitting: Internal Medicine

## 2016-09-05 ENCOUNTER — Encounter: Payer: Self-pay | Admitting: Internal Medicine

## 2016-09-05 ENCOUNTER — Other Ambulatory Visit: Payer: BLUE CROSS/BLUE SHIELD

## 2016-09-05 VITALS — BP 132/72 | HR 62 | Temp 98.1°F | Resp 16 | Wt 203.0 lb

## 2016-09-05 DIAGNOSIS — Z23 Encounter for immunization: Secondary | ICD-10-CM

## 2016-09-05 DIAGNOSIS — R7989 Other specified abnormal findings of blood chemistry: Secondary | ICD-10-CM

## 2016-09-05 DIAGNOSIS — R946 Abnormal results of thyroid function studies: Secondary | ICD-10-CM

## 2016-09-05 NOTE — Progress Notes (Signed)
Subjective:  Patient ID: Antonio Acosta, male    DOB: 18-Dec-1950  Age: 66 y.o. MRN: [id number]  CC: Thyroid Problem   HPI Antonio Acosta presents for f/up on recent labs that revealed a slightly elevated TSH level. He feels well and offers no complaints.  Outpatient Medications Prior to Visit  Medication Sig Dispense Refill  . aspirin 81 MG tablet Take 81 mg by mouth daily.    . brimonidine (ALPHAGAN) 0.15 % ophthalmic solution     . metoprolol succinate (TOPROL-XL) 100 MG 24 hr tablet Take 1 tablet (100 mg total) by mouth daily. Take with or immediately following a meal. 90 tablet 3  . Multiple Vitamin (MULTIVITAMIN WITH MINERALS) TABS Take 1 tablet by mouth daily.    . rosuvastatin (CRESTOR) 5 MG tablet Take 1 tablet (5 mg total) by mouth daily. 90 tablet 3  . telmisartan (MICARDIS) 40 MG tablet Take 1 tablet (40 mg total) by mouth daily. 90 tablet 3  . dorzolamide (TRUSOPT) 2 % ophthalmic solution   5   No facility-administered medications prior to visit.     ROS Review of Systems  Constitutional: Negative for appetite change, diaphoresis, fatigue and unexpected weight change.  HENT: Negative.   Eyes: Negative for visual disturbance.  Respiratory: Negative.  Negative for cough, shortness of breath and wheezing.   Cardiovascular: Negative for chest pain, palpitations and leg swelling.  Gastrointestinal: Negative for abdominal pain, constipation, diarrhea, nausea and vomiting.  Endocrine: Negative for cold intolerance and heat intolerance.  Genitourinary: Negative.  Negative for difficulty urinating.  Musculoskeletal: Negative.  Negative for myalgias.  Skin: Negative.   Neurological: Negative.  Negative for dizziness, weakness and light-headedness.  Hematological: Negative for adenopathy. Does not bruise/bleed easily.  Psychiatric/Behavioral: Negative.     Objective:  BP 132/72   Pulse 62   Temp 98.1 F (36.7 C) (Oral)   Resp 16   Wt 203 lb (92.1 kg)   SpO2 98%   BMI  27.53 kg/m   BP Readings from Last 3 Encounters:  09/05/16 132/72  07/24/16 124/80  07/27/15 (!) 158/90    Wt Readings from Last 3 Encounters:  09/05/16 203 lb (92.1 kg)  07/24/16 190 lb (86.2 kg)  07/27/15 192 lb (87.1 kg)    Physical Exam  Constitutional: He is oriented to person, place, and time. No distress.  HENT:  Mouth/Throat: Oropharynx is clear and moist. No oropharyngeal exudate.  Eyes: Conjunctivae are normal. Right eye exhibits no discharge. No scleral icterus.  Neck: No JVD present. No thyroid mass and no thyromegaly present.  Cardiovascular: Normal rate, regular rhythm and intact distal pulses.  Exam reveals no gallop and no friction rub.   No murmur heard. Pulmonary/Chest: Effort normal and breath sounds normal. No respiratory distress. He has no wheezes. He has no rales. He exhibits no tenderness.  Abdominal: Soft. Bowel sounds are normal. He exhibits no distension and no mass. There is no tenderness. There is no rebound and no guarding.  Musculoskeletal: Normal range of motion. He exhibits no edema or tenderness.  Lymphadenopathy:    He has no cervical adenopathy.  Neurological: He is alert and oriented to person, place, and time.  Skin: Skin is warm and dry. No rash noted. He is not diaphoretic. No erythema. No pallor.  Vitals reviewed.   Lab Results  Component Value Date   WBC 7.4 07/24/2016   HGB 13.1 07/24/2016   HCT 39.7 07/24/2016   PLT 243.0 07/24/2016   GLUCOSE 105 (H) 07/24/2016  CHOL 144 07/24/2016   TRIG 113.0 07/24/2016   HDL 55.60 07/24/2016   LDLDIRECT 137.6 09/11/2012   LDLCALC 66 07/24/2016   ALT 22 07/24/2016   AST 29 07/24/2016   NA 138 07/24/2016   K 4.3 07/24/2016   CL 104 07/24/2016   CREATININE 0.99 07/24/2016   BUN 13 07/24/2016   CO2 28 07/24/2016   TSH 4.17 09/05/2016   PSA 1.20 07/24/2016   INR 0.94 12/28/2011   HGBA1C 6.1 07/24/2016    Ct Abdomen Pelvis W Contrast  Result Date: 05/25/2014 CLINICAL DATA:  Right  lower quadrant pain, evaluate for appendicitis. Leukocytosis. EXAM: CT ABDOMEN AND PELVIS WITH CONTRAST TECHNIQUE: Multidetector CT imaging of the abdomen and pelvis was performed using the standard protocol following bolus administration of intravenous contrast. CONTRAST:  184mL OMNIPAQUE IOHEXOL 300 MG/ML  SOLN COMPARISON:  None. FINDINGS: Lower chest: Lung bases show scattered scarring or atelectasis. Heart size normal. Coronary artery calcification. No pericardial or pleural effusion. Hepatobiliary: Liver and gallbladder are unremarkable. No biliary ductal dilatation. Pancreas: Negative. Spleen: Negative. Adrenals/Urinary Tract: Adrenal glands and kidneys are unremarkable. Ureters are decompressed. Bladder is unremarkable. Stomach/Bowel: Stomach and small bowel are unremarkable. Appendix is dilated, measuring up to 10 mm in diameter. There is periappendiceal fat stranding and haziness. Colon is unremarkable. Vascular/Lymphatic: Atherosclerotic calcification of the arterial vasculature without abdominal aortic aneurysm. No pathologically enlarged lymph nodes. Reproductive: Prostate is enlarged. Other: Small scattered ascites. Mesenteries and peritoneum are otherwise unremarkable. Musculoskeletal: No worrisome lytic or sclerotic lesions. Prominent Schmorl's node in the superior endplate of H20. IMPRESSION: 1. Acute appendicitis without without evidence of rupture or abscess. 2. Scattered ascites. 3. Coronary artery calcification. 4. Enlarged prostate. Electronically Signed   By: Lorin Picket M.D.   On: 05/25/2014 16:25   Dg Abd Acute W/chest  Result Date: 05/25/2014 CLINICAL DATA:  Diffuse abdominal pain EXAM: DG ABDOMEN ACUTE W/ 1V CHEST COMPARISON:  None. FINDINGS: Cardiac shadow is within normal limits. The lungs are clear bilaterally. No bony abnormality is seen. The abdomen shows a nonobstructive bowel gas pattern. No abnormal mass or abnormal calcifications are noted. No acute bony abnormality is  seen. IMPRESSION: No acute abnormality noted. Electronically Signed   By: Inez Catalina M.D.   On: 05/25/2014 13:23    Assessment & Plan:   Antonio Acosta was seen today for thyroid problem.  Diagnoses and all orders for this visit:  Need for influenza vaccination -     Flu Vaccine QUAD 6+ mos PF IM (Fluarix Quad PF)  TSH elevation- He has no symptoms of hypothyroidism, his TFTs are normal now, his thyroid peroxidase antibody is negative for any concerns for autoimmune thyroid disease. No treatment is indicated at this time. Will continue to monitor this in the future. -     Thyroid peroxidase antibody; Future -     Thyroid Panel With TSH; Future   I have discontinued Mr. Reichow's dorzolamide. I am also having him maintain his multivitamin with minerals, aspirin, brimonidine, metoprolol succinate, rosuvastatin, telmisartan, and dorzolamidel-timolol.  Meds ordered this encounter  Medications  . dorzolamidel-timolol (COSOPT) 22.3-6.8 MG/ML SOLN ophthalmic solution    Sig: 1 drop 2 (two) times daily.     Follow-up: Return in about 4 months (around 01/05/2017).  Scarlette Calico, MD

## 2016-09-05 NOTE — Patient Instructions (Signed)
Thyroid-Stimulating Hormone Test Why am I having this test? A thyroid-stimulating hormone (TSH) test is a blood test that is done to measure the level of TSH, also known as thyrotropin, in your blood. TSH is produced by the pituitary gland. The pituitary gland is a small organ located just below the brain, behind your eyes and nasal passages. It is part of a system that monitors and maintains thyroid hormone levels and thyroid gland function. Thyroid hormones affect many body parts and systems, including the system that affects how quickly your body burns fuel for energy. Your health care provider may recommend testing your TSH level if you have signs and symptoms of abnormal thyroid hormone levels. Knowing the level of TSH in your blood can help your health care provider:  Diagnose a thyroid gland or pituitary gland disorder.  Manage your condition and treatment if you have hypothyroidism or hyperthyroidism.  What kind of sample is taken? A blood sample is required for this test. It is usually collected by inserting a needle into a vein. How do I prepare for this test? There is no preparation required for this test. What are the reference ranges? Reference rangesare considered healthy rangesestablished after testing a large group of healthy people. Reference rangesmay vary among different people, labs, and hospitals. It is your responsibility to obtain your test results. Ask the lab or department performing the test when and how you will get your results. Range of Normal Values:  Adult: 0.3-5 microunits/mL or 0.3-5 milliunits/L (SI units).  Newborn: 3-18 microunits/mL or 3-18 milliunits/L.  Cord: 3-12 microunits/mL or 3-12 milliunits/L.  What do the results mean? A high level of TSH may mean:  Your thyroid gland is not making enough thyroid hormones. When the thyroid gland does not make enough thyroid hormones, the pituitary gland releases TSH into the bloodstream. The  higher-than-normal levels of TSH prompt the thyroid gland to release more thyroid hormones.  You are getting an insufficient level of thyroid hormone medicine, if you are receiving this type of treatment.  There is a problem with the pituitary gland (rare).  A low level of TSH can indicate a problem with the pituitary gland. Talk with your health care provider to discuss your results, treatment options, and if necessary, the need for more tests. Talk with your health care provider if you have any questions about your results. Talk with your health care provider to discuss your results, treatment options, and if necessary, the need for more tests. Talk with your health care provider if you have any questions about your results. This information is not intended to replace advice given to you by your health care provider. Make sure you discuss any questions you have with your health care provider. Document Released: 01/19/2004 Document Revised: 08/27/2015 Document Reviewed: 05/19/2013 Elsevier Interactive Patient Education  2018 [employer]  

## 2016-09-06 ENCOUNTER — Encounter: Payer: Self-pay | Admitting: Internal Medicine

## 2016-09-06 LAB — THYROID PANEL WITH TSH
Free Thyroxine Index: 2.1 (ref 1.4–3.8)
T3 Uptake: 30 % (ref 22–35)
T4, Total: 7.1 ug/dL (ref 4.9–10.5)
TSH: 4.17 mIU/L (ref 0.40–4.50)

## 2016-09-06 LAB — THYROID PEROXIDASE ANTIBODY: Thyroperoxidase Ab SerPl-aCnc: 74 [IU]/mL — ABNORMAL HIGH

## 2016-10-03 ENCOUNTER — Encounter (INDEPENDENT_AMBULATORY_CARE_PROVIDER_SITE_OTHER): Payer: BLUE CROSS/BLUE SHIELD | Admitting: Ophthalmology

## 2016-10-03 DIAGNOSIS — H35033 Hypertensive retinopathy, bilateral: Secondary | ICD-10-CM

## 2016-10-03 DIAGNOSIS — H2513 Age-related nuclear cataract, bilateral: Secondary | ICD-10-CM | POA: Diagnosis not present

## 2016-10-03 DIAGNOSIS — D3132 Benign neoplasm of left choroid: Secondary | ICD-10-CM | POA: Diagnosis not present

## 2016-10-03 DIAGNOSIS — H34831 Tributary (branch) retinal vein occlusion, right eye, with macular edema: Secondary | ICD-10-CM | POA: Diagnosis not present

## 2016-10-03 DIAGNOSIS — H43813 Vitreous degeneration, bilateral: Secondary | ICD-10-CM | POA: Diagnosis not present

## 2016-10-03 DIAGNOSIS — I10 Essential (primary) hypertension: Secondary | ICD-10-CM

## 2016-10-03 DIAGNOSIS — H33301 Unspecified retinal break, right eye: Secondary | ICD-10-CM

## 2016-11-14 ENCOUNTER — Encounter (INDEPENDENT_AMBULATORY_CARE_PROVIDER_SITE_OTHER): Payer: BLUE CROSS/BLUE SHIELD | Admitting: Ophthalmology

## 2016-11-14 DIAGNOSIS — H43813 Vitreous degeneration, bilateral: Secondary | ICD-10-CM | POA: Diagnosis not present

## 2016-11-14 DIAGNOSIS — H34831 Tributary (branch) retinal vein occlusion, right eye, with macular edema: Secondary | ICD-10-CM

## 2016-11-14 DIAGNOSIS — H2513 Age-related nuclear cataract, bilateral: Secondary | ICD-10-CM

## 2016-11-14 DIAGNOSIS — H33301 Unspecified retinal break, right eye: Secondary | ICD-10-CM

## 2016-11-14 DIAGNOSIS — H35033 Hypertensive retinopathy, bilateral: Secondary | ICD-10-CM

## 2016-11-14 DIAGNOSIS — I10 Essential (primary) hypertension: Secondary | ICD-10-CM | POA: Diagnosis not present

## 2016-11-14 DIAGNOSIS — D3132 Benign neoplasm of left choroid: Secondary | ICD-10-CM | POA: Diagnosis not present

## 2016-12-26 ENCOUNTER — Encounter (INDEPENDENT_AMBULATORY_CARE_PROVIDER_SITE_OTHER): Payer: BLUE CROSS/BLUE SHIELD | Admitting: Ophthalmology

## 2016-12-26 DIAGNOSIS — D3132 Benign neoplasm of left choroid: Secondary | ICD-10-CM | POA: Diagnosis not present

## 2016-12-26 DIAGNOSIS — H43813 Vitreous degeneration, bilateral: Secondary | ICD-10-CM | POA: Diagnosis not present

## 2016-12-26 DIAGNOSIS — H35033 Hypertensive retinopathy, bilateral: Secondary | ICD-10-CM | POA: Diagnosis not present

## 2016-12-26 DIAGNOSIS — H34831 Tributary (branch) retinal vein occlusion, right eye, with macular edema: Secondary | ICD-10-CM

## 2016-12-26 DIAGNOSIS — H2513 Age-related nuclear cataract, bilateral: Secondary | ICD-10-CM

## 2016-12-26 DIAGNOSIS — I10 Essential (primary) hypertension: Secondary | ICD-10-CM

## 2016-12-26 DIAGNOSIS — H33301 Unspecified retinal break, right eye: Secondary | ICD-10-CM

## 2017-02-04 ENCOUNTER — Encounter (INDEPENDENT_AMBULATORY_CARE_PROVIDER_SITE_OTHER): Payer: BLUE CROSS/BLUE SHIELD | Admitting: Ophthalmology

## 2017-02-04 DIAGNOSIS — I10 Essential (primary) hypertension: Secondary | ICD-10-CM

## 2017-02-04 DIAGNOSIS — H2513 Age-related nuclear cataract, bilateral: Secondary | ICD-10-CM

## 2017-02-04 DIAGNOSIS — H35033 Hypertensive retinopathy, bilateral: Secondary | ICD-10-CM | POA: Diagnosis not present

## 2017-02-04 DIAGNOSIS — H34831 Tributary (branch) retinal vein occlusion, right eye, with macular edema: Secondary | ICD-10-CM | POA: Diagnosis not present

## 2017-02-04 DIAGNOSIS — H33301 Unspecified retinal break, right eye: Secondary | ICD-10-CM | POA: Diagnosis not present

## 2017-02-04 DIAGNOSIS — H43813 Vitreous degeneration, bilateral: Secondary | ICD-10-CM | POA: Diagnosis not present

## 2017-02-04 DIAGNOSIS — D3132 Benign neoplasm of left choroid: Secondary | ICD-10-CM | POA: Diagnosis not present

## 2017-02-06 ENCOUNTER — Encounter (INDEPENDENT_AMBULATORY_CARE_PROVIDER_SITE_OTHER): Payer: BLUE CROSS/BLUE SHIELD | Admitting: Ophthalmology

## 2017-02-11 LAB — HM DIABETES EYE EXAM

## 2017-02-13 ENCOUNTER — Encounter: Payer: Self-pay | Admitting: Internal Medicine

## 2017-02-13 NOTE — Progress Notes (Signed)
Outside notes received. Information abstracted. Notes sent to scan.  

## 2017-02-20 ENCOUNTER — Encounter: Payer: Self-pay | Admitting: Internal Medicine

## 2017-02-20 NOTE — Progress Notes (Signed)
Result extracted and sent to scan °

## 2017-03-19 ENCOUNTER — Encounter (INDEPENDENT_AMBULATORY_CARE_PROVIDER_SITE_OTHER): Payer: BLUE CROSS/BLUE SHIELD | Admitting: Ophthalmology

## 2017-03-19 DIAGNOSIS — H35033 Hypertensive retinopathy, bilateral: Secondary | ICD-10-CM

## 2017-03-19 DIAGNOSIS — H2513 Age-related nuclear cataract, bilateral: Secondary | ICD-10-CM

## 2017-03-19 DIAGNOSIS — D3132 Benign neoplasm of left choroid: Secondary | ICD-10-CM | POA: Diagnosis not present

## 2017-03-19 DIAGNOSIS — H33301 Unspecified retinal break, right eye: Secondary | ICD-10-CM

## 2017-03-19 DIAGNOSIS — H43813 Vitreous degeneration, bilateral: Secondary | ICD-10-CM | POA: Diagnosis not present

## 2017-03-19 DIAGNOSIS — H34831 Tributary (branch) retinal vein occlusion, right eye, with macular edema: Secondary | ICD-10-CM | POA: Diagnosis not present

## 2017-03-19 DIAGNOSIS — I10 Essential (primary) hypertension: Secondary | ICD-10-CM | POA: Diagnosis not present

## 2017-05-13 ENCOUNTER — Encounter (INDEPENDENT_AMBULATORY_CARE_PROVIDER_SITE_OTHER): Payer: BLUE CROSS/BLUE SHIELD | Admitting: Ophthalmology

## 2017-05-13 DIAGNOSIS — H2513 Age-related nuclear cataract, bilateral: Secondary | ICD-10-CM | POA: Diagnosis not present

## 2017-05-13 DIAGNOSIS — H33301 Unspecified retinal break, right eye: Secondary | ICD-10-CM | POA: Diagnosis not present

## 2017-05-13 DIAGNOSIS — H34831 Tributary (branch) retinal vein occlusion, right eye, with macular edema: Secondary | ICD-10-CM

## 2017-05-13 DIAGNOSIS — I10 Essential (primary) hypertension: Secondary | ICD-10-CM | POA: Diagnosis not present

## 2017-05-13 DIAGNOSIS — H35371 Puckering of macula, right eye: Secondary | ICD-10-CM

## 2017-05-13 DIAGNOSIS — H35033 Hypertensive retinopathy, bilateral: Secondary | ICD-10-CM

## 2017-06-24 ENCOUNTER — Encounter (INDEPENDENT_AMBULATORY_CARE_PROVIDER_SITE_OTHER): Payer: BLUE CROSS/BLUE SHIELD | Admitting: Ophthalmology

## 2017-06-24 DIAGNOSIS — I10 Essential (primary) hypertension: Secondary | ICD-10-CM | POA: Diagnosis not present

## 2017-06-24 DIAGNOSIS — D3132 Benign neoplasm of left choroid: Secondary | ICD-10-CM

## 2017-06-24 DIAGNOSIS — H35033 Hypertensive retinopathy, bilateral: Secondary | ICD-10-CM | POA: Diagnosis not present

## 2017-06-24 DIAGNOSIS — H33301 Unspecified retinal break, right eye: Secondary | ICD-10-CM

## 2017-06-24 DIAGNOSIS — H34831 Tributary (branch) retinal vein occlusion, right eye, with macular edema: Secondary | ICD-10-CM | POA: Diagnosis not present

## 2017-06-24 DIAGNOSIS — H43813 Vitreous degeneration, bilateral: Secondary | ICD-10-CM

## 2017-06-24 DIAGNOSIS — H2513 Age-related nuclear cataract, bilateral: Secondary | ICD-10-CM | POA: Diagnosis not present

## 2017-08-01 ENCOUNTER — Encounter (INDEPENDENT_AMBULATORY_CARE_PROVIDER_SITE_OTHER): Payer: BLUE CROSS/BLUE SHIELD | Admitting: Ophthalmology

## 2017-08-01 DIAGNOSIS — I10 Essential (primary) hypertension: Secondary | ICD-10-CM

## 2017-08-01 DIAGNOSIS — D3132 Benign neoplasm of left choroid: Secondary | ICD-10-CM

## 2017-08-01 DIAGNOSIS — H34831 Tributary (branch) retinal vein occlusion, right eye, with macular edema: Secondary | ICD-10-CM

## 2017-08-01 DIAGNOSIS — H35033 Hypertensive retinopathy, bilateral: Secondary | ICD-10-CM | POA: Diagnosis not present

## 2017-08-01 DIAGNOSIS — H2513 Age-related nuclear cataract, bilateral: Secondary | ICD-10-CM

## 2017-08-01 DIAGNOSIS — H43813 Vitreous degeneration, bilateral: Secondary | ICD-10-CM

## 2017-08-04 ENCOUNTER — Telehealth: Payer: Self-pay | Admitting: Internal Medicine

## 2017-08-04 ENCOUNTER — Other Ambulatory Visit (INDEPENDENT_AMBULATORY_CARE_PROVIDER_SITE_OTHER): Payer: BLUE CROSS/BLUE SHIELD

## 2017-08-04 ENCOUNTER — Encounter: Payer: Self-pay | Admitting: Internal Medicine

## 2017-08-04 ENCOUNTER — Ambulatory Visit (INDEPENDENT_AMBULATORY_CARE_PROVIDER_SITE_OTHER): Payer: BLUE CROSS/BLUE SHIELD | Admitting: Internal Medicine

## 2017-08-04 VITALS — BP 146/82 | HR 60 | Temp 97.8°F | Resp 16 | Ht 72.0 in | Wt 201.0 lb

## 2017-08-04 DIAGNOSIS — I10 Essential (primary) hypertension: Secondary | ICD-10-CM

## 2017-08-04 DIAGNOSIS — R7989 Other specified abnormal findings of blood chemistry: Secondary | ICD-10-CM | POA: Diagnosis not present

## 2017-08-04 DIAGNOSIS — R7303 Prediabetes: Secondary | ICD-10-CM

## 2017-08-04 DIAGNOSIS — Z0000 Encounter for general adult medical examination without abnormal findings: Secondary | ICD-10-CM

## 2017-08-04 DIAGNOSIS — E785 Hyperlipidemia, unspecified: Secondary | ICD-10-CM | POA: Diagnosis not present

## 2017-08-04 LAB — LIPID PANEL
Cholesterol: 169 mg/dL (ref 0–200)
HDL: 57.5 mg/dL (ref >39.00)
LDL CALC: 85 mg/dL (ref 0–99)
NONHDL: 111.68
Total CHOL/HDL Ratio: 3
Triglycerides: 131 mg/dL (ref 0.0–149.0)
VLDL: 26.2 mg/dL (ref 0.0–40.0)

## 2017-08-04 LAB — CBC WITH DIFFERENTIAL/PLATELET
BASOS ABS: 0.1 10*3/uL (ref 0.0–0.1)
Basophils Relative: 0.8 % (ref 0.0–3.0)
EOS ABS: 0.2 10*3/uL (ref 0.0–0.7)
Eosinophils Relative: 3 % (ref 0.0–5.0)
HCT: 39.9 % (ref 39.0–52.0)
HEMOGLOBIN: 13.4 g/dL (ref 13.0–17.0)
Lymphocytes Relative: 28.6 % (ref 12.0–46.0)
Lymphs Abs: 2.3 10*3/uL (ref 0.7–4.0)
MCHC: 33.7 g/dL (ref 30.0–36.0)
MCV: 84.9 fl (ref 78.0–100.0)
MONO ABS: 0.6 10*3/uL (ref 0.1–1.0)
Monocytes Relative: 8 % (ref 3.0–12.0)
Neutro Abs: 4.8 10*3/uL (ref 1.4–7.7)
Neutrophils Relative %: 59.6 % (ref 43.0–77.0)
Platelets: 268 10*3/uL (ref 150.0–400.0)
RBC: 4.69 Mil/uL (ref 4.22–5.81)
RDW: 13.5 % (ref 11.5–15.5)
WBC: 8.1 10*3/uL (ref 4.0–10.5)

## 2017-08-04 LAB — URINALYSIS, ROUTINE W REFLEX MICROSCOPIC
BILIRUBIN URINE: NEGATIVE
Hgb urine dipstick: NEGATIVE
KETONES UR: NEGATIVE
LEUKOCYTES UA: NEGATIVE
NITRITE: NEGATIVE
PH: 6 (ref 5.0–8.0)
RBC / HPF: NONE SEEN (ref 0–?)
Specific Gravity, Urine: <=1.005 — AB (ref 1.000–1.030)
Total Protein, Urine: NEGATIVE
UROBILINOGEN UA: 0.2 (ref 0.0–1.0)
Urine Glucose: NEGATIVE
WBC UA: NONE SEEN (ref 0–?)

## 2017-08-04 LAB — COMPREHENSIVE METABOLIC PANEL
ALBUMIN: 4.8 g/dL (ref 3.5–5.2)
ALT: 21 U/L (ref 0–53)
AST: 28 U/L (ref 0–37)
Alkaline Phosphatase: 60 U/L (ref 39–117)
BUN: 21 mg/dL (ref 6–23)
CALCIUM: 9.8 mg/dL (ref 8.4–10.5)
CHLORIDE: 101 meq/L (ref 96–112)
CO2: 27 mEq/L (ref 19–32)
CREATININE: 1 mg/dL (ref 0.40–1.50)
GFR: 79.71 mL/min (ref >60.00)
GLUCOSE: 99 mg/dL (ref 70–99)
POTASSIUM: 4.3 meq/L (ref 3.5–5.1)
Sodium: 138 mEq/L (ref 135–145)
TOTAL PROTEIN: 7.5 g/dL (ref 6.0–8.3)
Total Bilirubin: 0.9 mg/dL (ref 0.2–1.2)

## 2017-08-04 LAB — VITAMIN D 25 HYDROXY (VIT D DEFICIENCY, FRACTURES): VITD: 33.21 ng/mL (ref 30.00–100.00)

## 2017-08-04 LAB — PSA: PSA: 1.27 ng/mL (ref 0.10–4.00)

## 2017-08-04 LAB — HEMOGLOBIN A1C: Hgb A1c MFr Bld: 6.2 % (ref 4.6–6.5)

## 2017-08-04 MED ORDER — TELMISARTAN 80 MG PO TABS: 80.0000 mg | ORAL_TABLET | Freq: Every day | ORAL | 1 refills | Status: DC

## 2017-08-04 MED ORDER — ASPIRIN 81 MG PO TABS: 81.0000 mg | ORAL_TABLET | Freq: Every day | ORAL | 1 refills | Status: DC

## 2017-08-04 MED ORDER — METOPROLOL SUCCINATE ER 100 MG PO TB24: 100.0000 mg | ORAL_TABLET | Freq: Every day | ORAL | 3 refills | Status: DC

## 2017-08-04 MED ORDER — ZOSTER VAC RECOMB ADJUVANTED 50 MCG/0.5ML IM SUSR: 0.5000 mL | Freq: Once | INTRAMUSCULAR | 1 refills | Status: AC

## 2017-08-04 MED ORDER — ROSUVASTATIN CALCIUM 5 MG PO TABS: 5.0000 mg | ORAL_TABLET | Freq: Every day | ORAL | 3 refills | Status: DC

## 2017-08-04 NOTE — Telephone Encounter (Signed)
This has been completed.

## 2017-08-04 NOTE — Telephone Encounter (Signed)
Patient came back into the office after his appointment stating that he will need all of his prescriptions refilled. He said that he had about 1 week left.

## 2017-08-04 NOTE — Patient Instructions (Signed)

## 2017-08-04 NOTE — Progress Notes (Signed)
Subjective:  Patient ID: Antonio Acosta, male    DOB: Aug 13, 1950  Age: 67 y.o. MRN: [id number]  CC: Hypertension and Annual Exam   HPI Antonio Acosta presents for a CPX.  He feels well today and offers no complaints.  He has been monitoring his blood pressure at home and has not had a systolic greater than 409 over the last year.  He is active and denies any recent episodes of SOB, CP, DOE, palpitations, edema, or fatigue.  He is also tolerating the cholesterol medicine well with no muscle or joint aches.  Outpatient Medications Prior to Visit  Medication Sig Dispense Refill  . brimonidine (ALPHAGAN) 0.2 % ophthalmic solution   2  . dorzolamide-timolol (COSOPT) 22.3-6.8 MG/ML ophthalmic solution   2  . Multiple Vitamin (MULTIVITAMIN WITH MINERALS) TABS Take 1 tablet by mouth daily.    . RHOPRESSA 0.02 % SOLN   2  . aspirin 81 MG tablet Take 81 mg by mouth daily.    . metoprolol succinate (TOPROL-XL) 100 MG 24 hr tablet Take 1 tablet (100 mg total) by mouth daily. Take with or immediately following a meal. 90 tablet 3  . rosuvastatin (CRESTOR) 5 MG tablet Take 1 tablet (5 mg total) by mouth daily. 90 tablet 3  . telmisartan (MICARDIS) 40 MG tablet Take 1 tablet (40 mg total) by mouth daily. 90 tablet 3  . brimonidine (ALPHAGAN) 0.15 % ophthalmic solution     . dorzolamidel-timolol (COSOPT) 22.3-6.8 MG/ML SOLN ophthalmic solution 1 drop 2 (two) times daily.     No facility-administered medications prior to visit.     ROS Review of Systems  Constitutional: Negative for diaphoresis and fatigue.  HENT: Negative.   Eyes: Negative for visual disturbance.  Respiratory: Negative.  Negative for cough, chest tightness, shortness of breath and wheezing.   Cardiovascular: Negative for chest pain, palpitations and leg swelling.  Gastrointestinal: Negative for abdominal pain, constipation, diarrhea, nausea and vomiting.  Endocrine: Negative.   Genitourinary: Negative.  Negative for difficulty  urinating, dysuria, penile swelling, scrotal swelling and testicular pain.  Musculoskeletal: Negative.  Negative for arthralgias and myalgias.  Skin: Negative for color change, pallor and rash.  Neurological: Negative for dizziness, weakness and light-headedness.  Hematological: Negative for adenopathy. Does not bruise/bleed easily.  Psychiatric/Behavioral: Negative.  Negative for behavioral problems, dysphoric mood, sleep disturbance and suicidal ideas.    Objective:  BP (!) 146/82 (BP Location: Left Arm, Patient Position: Sitting, Cuff Size: Large)   Pulse 60   Temp 97.8 F (36.6 C) (Oral)   Resp 16   Ht 6' (1.829 m)   Wt 201 lb (91.2 kg)   SpO2 98%   BMI 27.26 kg/m   BP Readings from Last 3 Encounters:  08/04/17 (!) 146/82  09/05/16 132/72  07/24/16 124/80    Wt Readings from Last 3 Encounters:  08/04/17 201 lb (91.2 kg)  09/05/16 203 lb (92.1 kg)  07/24/16 190 lb (86.2 kg)    Physical Exam  Constitutional: He is oriented to person, place, and time. No distress.  HENT:  Mouth/Throat: Oropharynx is clear and moist. No oropharyngeal exudate.  Eyes: Conjunctivae are normal. No scleral icterus.  Neck: Normal range of motion. Neck supple. No JVD present. No thyromegaly present.  Cardiovascular: Normal rate, regular rhythm and normal heart sounds. Exam reveals no gallop.  No murmur heard. EKG ----  Sinus  Rhythm  Low voltage in limb leads.   ABNORMAL   Pulmonary/Chest: Effort normal and breath sounds normal. No  respiratory distress. He has no wheezes. He has no rales.  Abdominal: Soft. Normal appearance and bowel sounds are normal. He exhibits no mass. There is no hepatosplenomegaly. There is no tenderness. No hernia. Hernia confirmed negative in the right inguinal area and confirmed negative in the left inguinal area.  Genitourinary: Rectum normal, testes normal and penis normal. Rectal exam shows no external hemorrhoid, no internal hemorrhoid, no fissure, no mass, no  tenderness, anal tone normal and guaiac negative stool. Prostate is enlarged (1+ smooth symm BPH). Prostate is not tender. Right testis shows no mass, no swelling and no tenderness. Left testis shows no mass, no swelling and no tenderness. Circumcised. No penile erythema or penile tenderness. No discharge found.  Musculoskeletal: Normal range of motion. He exhibits no edema, tenderness or deformity.  Lymphadenopathy:    He has no cervical adenopathy. No inguinal adenopathy noted on the right or left side.  Neurological: He is alert and oriented to person, place, and time.  Skin: Skin is warm and dry. No rash noted. He is not diaphoretic.  Psychiatric: He has a normal mood and affect. His behavior is normal. Judgment and thought content normal.  Vitals reviewed.   Lab Results  Component Value Date   WBC 8.1 08/04/2017   HGB 13.4 08/04/2017   HCT 39.9 08/04/2017   PLT 268.0 08/04/2017   GLUCOSE 99 08/04/2017   CHOL 169 08/04/2017   TRIG 131.0 08/04/2017   HDL 57.50 08/04/2017   LDLDIRECT 137.6 09/11/2012   LDLCALC 85 08/04/2017   ALT 21 08/04/2017   AST 28 08/04/2017   NA 138 08/04/2017   K 4.3 08/04/2017   CL 101 08/04/2017   CREATININE 1.00 08/04/2017   BUN 21 08/04/2017   CO2 27 08/04/2017   TSH 7.03 (H) 08/04/2017   PSA 1.27 08/04/2017   INR 0.94 12/28/2011   HGBA1C 6.2 08/04/2017    Ct Abdomen Pelvis W Contrast  Result Date: 05/25/2014 CLINICAL DATA:  Right lower quadrant pain, evaluate for appendicitis. Leukocytosis. EXAM: CT ABDOMEN AND PELVIS WITH CONTRAST TECHNIQUE: Multidetector CT imaging of the abdomen and pelvis was performed using the standard protocol following bolus administration of intravenous contrast. CONTRAST:  178mL OMNIPAQUE IOHEXOL 300 MG/ML  SOLN COMPARISON:  None. FINDINGS: Lower chest: Lung bases show scattered scarring or atelectasis. Heart size normal. Coronary artery calcification. No pericardial or pleural effusion. Hepatobiliary: Liver and  gallbladder are unremarkable. No biliary ductal dilatation. Pancreas: Negative. Spleen: Negative. Adrenals/Urinary Tract: Adrenal glands and kidneys are unremarkable. Ureters are decompressed. Bladder is unremarkable. Stomach/Bowel: Stomach and small bowel are unremarkable. Appendix is dilated, measuring up to 10 mm in diameter. There is periappendiceal fat stranding and haziness. Colon is unremarkable. Vascular/Lymphatic: Atherosclerotic calcification of the arterial vasculature without abdominal aortic aneurysm. No pathologically enlarged lymph nodes. Reproductive: Prostate is enlarged. Other: Small scattered ascites. Mesenteries and peritoneum are otherwise unremarkable. Musculoskeletal: No worrisome lytic or sclerotic lesions. Prominent Schmorl's node in the superior endplate of K24. IMPRESSION: 1. Acute appendicitis without without evidence of rupture or abscess. 2. Scattered ascites. 3. Coronary artery calcification. 4. Enlarged prostate. Electronically Signed   By: Lorin Picket M.D.   On: 05/25/2014 16:25   Dg Abd Acute W/chest  Result Date: 05/25/2014 CLINICAL DATA:  Diffuse abdominal pain EXAM: DG ABDOMEN ACUTE W/ 1V CHEST COMPARISON:  None. FINDINGS: Cardiac shadow is within normal limits. The lungs are clear bilaterally. No bony abnormality is seen. The abdomen shows a nonobstructive bowel gas pattern. No abnormal mass or abnormal calcifications  are noted. No acute bony abnormality is seen. IMPRESSION: No acute abnormality noted. Electronically Signed   By: Inez Catalina M.D.   On: 05/25/2014 13:23    Assessment & Plan:   Antonio Acosta was seen today for hypertension and annual exam.  Diagnoses and all orders for this visit:  Essential hypertension, benign- His blood pressure is well controlled.  Labs are negative for secondary causes or endorgan damage.  His EKG is negative for LVH.  Will continue the current combination of metoprolol and telmisartan. -     CBC with Differential/Platelet;  Future -     Comprehensive metabolic panel; Future -     Urinalysis, Routine w reflex microscopic; Future -     VITAMIN D 25 Hydroxy (Vit-D Deficiency, Fractures); Future -     Discontinue: telmisartan (MICARDIS) 80 MG tablet; Take 1 tablet (80 mg total) by mouth daily. -     EKG 12-Lead -     metoprolol succinate (TOPROL-XL) 100 MG 24 hr tablet; Take 1 tablet (100 mg total) by mouth daily. Take with or immediately following a meal. -     telmisartan (MICARDIS) 80 MG tablet; Take 1 tablet (80 mg total) by mouth daily.  Prediabetes- His A1c is at 6.2%.  Medical therapy is not indicated.  He will continue to work on his lifestyle modifications. -     Comprehensive metabolic panel; Future -     Hemoglobin A1c; Future  TSH elevation- His TSH is mildly elevated at 7.  His T3 and T4 are normal.  Clinically he appears euthyroid.  I will continue to monitor this but at this time I do not think thyroid replacement therapy is indicated. -     Thyroid Panel With TSH; Future  Routine general medical examination at a health care facility- Exam completed, labs reviewed, vaccines reviewed and updated, colon cancer screening is up-to-date, patient education material was given. -     Lipid panel; Future -     PSA; Future  Hyperlipidemia with target LDL less than 130- He has achieved his LDL goal and is doing well on the statin. -     rosuvastatin (CRESTOR) 5 MG tablet; Take 1 tablet (5 mg total) by mouth daily.  Other orders -     Cancel: Pneumococcal polysaccharide vaccine 23-valent greater than or equal to 2yo subcutaneous/IM -     Zoster Vaccine Adjuvanted [facility]) injection; Inject 0.5 mLs into the muscle once for 1 dose. -     aspirin 81 MG tablet; Take 1 tablet (81 mg total) by mouth daily.   I have discontinued Antonio Acosta's dorzolamidel-timolol. I have also changed his aspirin. Additionally, I am having him start on Zoster Vaccine Adjuvanted. Lastly, I am having him maintain his multivitamin  with minerals, RHOPRESSA, brimonidine, dorzolamide-timolol, metoprolol succinate, rosuvastatin, and telmisartan.  Meds ordered this encounter  Medications  . Zoster Vaccine Adjuvanted [facility]) injection    Sig: Inject 0.5 mLs into the muscle once for 1 dose.    Dispense:  0.5 mL    Refill:  1    Pt will have medicare in August.  . DISCONTD: telmisartan (MICARDIS) 80 MG tablet    Sig: Take 1 tablet (80 mg total) by mouth daily.    Dispense:  90 tablet    Refill:  1  . metoprolol succinate (TOPROL-XL) 100 MG 24 hr tablet    Sig: Take 1 tablet (100 mg total) by mouth daily. Take with or immediately following a meal.  Dispense:  90 tablet    Refill:  3  . rosuvastatin (CRESTOR) 5 MG tablet    Sig: Take 1 tablet (5 mg total) by mouth daily.    Dispense:  90 tablet    Refill:  3  . telmisartan (MICARDIS) 80 MG tablet    Sig: Take 1 tablet (80 mg total) by mouth daily.    Dispense:  90 tablet    Refill:  1  . aspirin 81 MG tablet    Sig: Take 1 tablet (81 mg total) by mouth daily.    Dispense:  90 tablet    Refill:  1     Follow-up: Return in about 6 months (around 02/04/2018).  Scarlette Calico, MD

## 2017-08-05 ENCOUNTER — Encounter: Payer: Self-pay | Admitting: Internal Medicine

## 2017-08-05 LAB — THYROID PANEL WITH TSH
FREE THYROXINE INDEX: 2.2 (ref 1.4–3.8)
T3 Uptake: 31 % (ref 22–35)
T4, Total: 7.2 ug/dL (ref 4.9–10.5)
TSH: 7.03 m[IU]/L — AB (ref 0.40–4.50)

## 2017-08-11 ENCOUNTER — Encounter (INDEPENDENT_AMBULATORY_CARE_PROVIDER_SITE_OTHER): Payer: BLUE CROSS/BLUE SHIELD | Admitting: Ophthalmology

## 2017-08-13 DIAGNOSIS — H2521 Age-related cataract, morgagnian type, right eye: Secondary | ICD-10-CM | POA: Diagnosis not present

## 2017-08-13 DIAGNOSIS — H401111 Primary open-angle glaucoma, right eye, mild stage: Secondary | ICD-10-CM | POA: Diagnosis not present

## 2017-08-13 DIAGNOSIS — H25811 Combined forms of age-related cataract, right eye: Secondary | ICD-10-CM | POA: Diagnosis not present

## 2017-09-03 ENCOUNTER — Encounter: Payer: Self-pay | Admitting: Internal Medicine

## 2017-09-03 DIAGNOSIS — I10 Essential (primary) hypertension: Secondary | ICD-10-CM

## 2017-09-03 MED ORDER — TELMISARTAN 80 MG PO TABS: 80.0000 mg | ORAL_TABLET | Freq: Every day | ORAL | 1 refills | Status: DC

## 2017-09-11 ENCOUNTER — Encounter (INDEPENDENT_AMBULATORY_CARE_PROVIDER_SITE_OTHER): Payer: Medicare Other | Admitting: Ophthalmology

## 2017-09-11 DIAGNOSIS — H27131 Posterior dislocation of lens, right eye: Secondary | ICD-10-CM | POA: Diagnosis not present

## 2017-09-11 DIAGNOSIS — H2512 Age-related nuclear cataract, left eye: Secondary | ICD-10-CM | POA: Diagnosis not present

## 2017-09-11 DIAGNOSIS — I10 Essential (primary) hypertension: Secondary | ICD-10-CM | POA: Diagnosis not present

## 2017-09-11 DIAGNOSIS — D3132 Benign neoplasm of left choroid: Secondary | ICD-10-CM | POA: Diagnosis not present

## 2017-09-11 DIAGNOSIS — H35033 Hypertensive retinopathy, bilateral: Secondary | ICD-10-CM

## 2017-09-11 DIAGNOSIS — H43813 Vitreous degeneration, bilateral: Secondary | ICD-10-CM

## 2017-09-11 DIAGNOSIS — H34831 Tributary (branch) retinal vein occlusion, right eye, with macular edema: Secondary | ICD-10-CM | POA: Diagnosis not present

## 2017-09-11 HISTORY — PX: CATARACT EXTRACTION W/ INTRAOCULAR LENS IMPLANT: SHX1309

## 2017-09-19 NOTE — H&P (Signed)
Antonio Acosta is an 67 y.o. male.   Chief Complaint:poor vision after cataract surgery right eye HPI: Had cataract surgery 08-14-17.  Dislocated intraocular lens.  Has preretinal fibrosis right eye as well.   Past Medical History:  Diagnosis Date  . Hypertension   . Substance abuse [facility])     Past Surgical History:  Procedure Laterality Date  . LAPAROSCOPIC APPENDECTOMY N/A 05/25/2014   Procedure: APPENDECTOMY LAPAROSCOPIC;  Surgeon: Autumn Messing III, MD;  Location: [facility] ORS;  Service: General;  Laterality: N/A;  . TONSILLECTOMY      Family History  Problem Relation Age of Onset  . Hypertension Father   . Stroke Mother   . Kidney disease Neg Hx   . Hyperlipidemia Neg Hx   . Heart disease Neg Hx   . Early death Neg Hx   . Drug abuse Neg Hx   . Diabetes Neg Hx   . Cancer Neg Hx   . Colon cancer Neg Hx    Social History:  reports that he has quit smoking. He has never used smokeless tobacco. He reports that he does not drink alcohol or use drugs.  Allergies: No Known Allergies  No medications prior to admission.    Review of systems otherwise negative  There were no vitals taken for this visit.  Physical exam: Mental status: oriented x3. Eyes: See eye exam associated with this date of surgery in media tab.  Scanned in by scanning center Ears, Nose, Throat: within normal limits Neck: Within Normal limits General: within normal limits Chest: Within normal limits Breast: deferred Heart: Within normal limits Abdomen: Within normal limits GU: deferred Extremities: within normal limits Skin: within normal limits  Assessment/Plan Assessment:  Dislocated intraocular lens, preretinal fibrosis right eye Plan: To [facility] for Pars plana vitrectomy, membrane peel, laser, secondary intraocular lens with suture right eye, gas injection.    Hayden Pedro 09/19/2017, 12:45 PM

## 2017-10-13 ENCOUNTER — Other Ambulatory Visit: Payer: Self-pay

## 2017-10-13 ENCOUNTER — Encounter (HOSPITAL_COMMUNITY): Payer: Self-pay | Admitting: *Deleted

## 2017-10-14 ENCOUNTER — Encounter (INDEPENDENT_AMBULATORY_CARE_PROVIDER_SITE_OTHER): Payer: Medicare Other | Admitting: Ophthalmology

## 2017-10-14 ENCOUNTER — Encounter (HOSPITAL_COMMUNITY): Payer: Self-pay | Admitting: *Deleted

## 2017-10-14 ENCOUNTER — Ambulatory Visit (HOSPITAL_COMMUNITY)
Admission: RE | Admit: 2017-10-14 | Discharge: 2017-10-15 | Disposition: A | Discharge status: Home / Self Care | Payer: Medicare Other | Source: Ambulatory Visit | Attending: Ophthalmology | Admitting: Ophthalmology

## 2017-10-14 ENCOUNTER — Encounter (HOSPITAL_COMMUNITY)
Admission: RE | Disposition: A | Discharge status: Home / Self Care | Payer: Self-pay | Source: Ambulatory Visit | Attending: Ophthalmology

## 2017-10-14 ENCOUNTER — Ambulatory Visit (HOSPITAL_COMMUNITY): Payer: Medicare Other | Admitting: Certified Registered"

## 2017-10-14 DIAGNOSIS — Y831 Surgical operation with implant of artificial internal device as the cause of abnormal reaction of the patient, or of later complication, without mention of misadventure at the time of the procedure: Secondary | ICD-10-CM | POA: Insufficient documentation

## 2017-10-14 DIAGNOSIS — H35371 Puckering of macula, right eye: Secondary | ICD-10-CM

## 2017-10-14 DIAGNOSIS — T8522XA Displacement of intraocular lens, initial encounter: Secondary | ICD-10-CM | POA: Insufficient documentation

## 2017-10-14 DIAGNOSIS — H35033 Hypertensive retinopathy, bilateral: Secondary | ICD-10-CM

## 2017-10-14 DIAGNOSIS — H2512 Age-related nuclear cataract, left eye: Secondary | ICD-10-CM

## 2017-10-14 DIAGNOSIS — H27131 Posterior dislocation of lens, right eye: Secondary | ICD-10-CM

## 2017-10-14 DIAGNOSIS — D3132 Benign neoplasm of left choroid: Secondary | ICD-10-CM | POA: Diagnosis not present

## 2017-10-14 DIAGNOSIS — I10 Essential (primary) hypertension: Secondary | ICD-10-CM | POA: Insufficient documentation

## 2017-10-14 DIAGNOSIS — Z87891 Personal history of nicotine dependence: Secondary | ICD-10-CM | POA: Diagnosis not present

## 2017-10-14 DIAGNOSIS — H43813 Vitreous degeneration, bilateral: Secondary | ICD-10-CM

## 2017-10-14 DIAGNOSIS — Z79899 Other long term (current) drug therapy: Secondary | ICD-10-CM | POA: Diagnosis not present

## 2017-10-14 DIAGNOSIS — H34831 Tributary (branch) retinal vein occlusion, right eye, with macular edema: Secondary | ICD-10-CM

## 2017-10-14 DIAGNOSIS — H3589 Other specified retinal disorders: Secondary | ICD-10-CM | POA: Insufficient documentation

## 2017-10-14 DIAGNOSIS — H33301 Unspecified retinal break, right eye: Secondary | ICD-10-CM | POA: Diagnosis not present

## 2017-10-14 DIAGNOSIS — H44751 Retained (nonmagnetic) (old) foreign body in vitreous body, right eye: Secondary | ICD-10-CM | POA: Diagnosis not present

## 2017-10-14 DIAGNOSIS — T85898A Other specified complication of other internal prosthetic devices, implants and grafts, initial encounter: Secondary | ICD-10-CM | POA: Diagnosis not present

## 2017-10-14 HISTORY — PX: LASER PHOTO ABLATION: SHX5942

## 2017-10-14 HISTORY — PX: AIR/FLUID EXCHANGE: SHX6494

## 2017-10-14 HISTORY — DX: Cardiac murmur, unspecified: R01.1

## 2017-10-14 HISTORY — DX: Pure hypercholesterolemia, unspecified: E78.00

## 2017-10-14 HISTORY — PX: PARS PLANA VITRECTOMY: SHX2166

## 2017-10-14 HISTORY — PX: MEMBRANE PEEL: SHX5967

## 2017-10-14 LAB — CBC
HCT: 43.5 % (ref 39.0–52.0)
Hemoglobin: 14 g/dL (ref 13.0–17.0)
MCH: 27.8 pg (ref 26.0–34.0)
MCHC: 32.2 g/dL (ref 30.0–36.0)
MCV: 86.5 fL (ref 80.0–100.0)
PLATELETS: 263 10*3/uL (ref 150–400)
RBC: 5.03 MIL/uL (ref 4.22–5.81)
RDW: 13.2 % (ref 11.5–15.5)
WBC: 8.5 10*3/uL (ref 4.0–10.5)
nRBC: 0 % (ref 0.0–0.2)

## 2017-10-14 LAB — BASIC METABOLIC PANEL
Anion gap: 9 (ref 5–15)
BUN: 12 mg/dL (ref 8–23)
CO2: 23 mmol/L (ref 22–32)
CREATININE: 0.97 mg/dL (ref 0.61–1.24)
Calcium: 9.4 mg/dL (ref 8.9–10.3)
Chloride: 105 mmol/L (ref 98–111)
GFR calc Af Amer: >60 mL/min (ref >60)
GLUCOSE: 105 mg/dL — AB (ref 70–99)
Potassium: 3.7 mmol/L (ref 3.5–5.1)
SODIUM: 137 mmol/L (ref 135–145)

## 2017-10-14 SURGERY — PARS PLANA VITRECTOMY WITH 25G REMOVAL/SUTURE INTRAOCULAR LENS
Anesthesia: General | Site: Eye | Laterality: Right

## 2017-10-14 MED ORDER — MORPHINE SULFATE (PF) 2 MG/ML IV SOLN: 1.0000 mg | INTRAVENOUS | Status: DC | PRN

## 2017-10-14 MED ORDER — EPINEPHRINE PF 1 MG/ML IJ SOLN
INTRAOCULAR | Status: DC | PRN
  Administered 2017-10-14: 11:00:00

## 2017-10-14 MED ORDER — EPINEPHRINE PF 1 MG/ML IJ SOLN
INTRAMUSCULAR | Status: AC
  Filled 2017-10-14: qty 1

## 2017-10-14 MED ORDER — PHENYLEPHRINE HCL 10 MG/ML IJ SOLN
INTRAMUSCULAR | Status: DC | PRN
  Administered 2017-10-14: 120 ug via INTRAVENOUS
  Administered 2017-10-14 (×3): 80 ug via INTRAVENOUS
  Administered 2017-10-14: 120 ug via INTRAVENOUS
  Administered 2017-10-14: 40 ug via INTRAVENOUS
  Administered 2017-10-14: 120 ug via INTRAVENOUS

## 2017-10-14 MED ORDER — TRIAMCINOLONE ACETONIDE 40 MG/ML IJ SUSP
INTRAMUSCULAR | Status: AC
  Filled 2017-10-14: qty 5

## 2017-10-14 MED ORDER — DEXAMETHASONE SODIUM PHOSPHATE 10 MG/ML IJ SOLN
INTRAMUSCULAR | Status: AC
  Filled 2017-10-14: qty 1

## 2017-10-14 MED ORDER — STERILE WATER FOR INJECTION IJ SOLN
INTRAMUSCULAR | Status: DC | PRN
  Administered 2017-10-14: 20 mL

## 2017-10-14 MED ORDER — CYCLOPENTOLATE HCL 1 % OP SOLN
1.0000 [drp] | OPHTHALMIC | Status: AC | PRN
  Administered 2017-10-14 (×3): 1 [drp] via OPHTHALMIC
  Filled 2017-10-14: qty 2

## 2017-10-14 MED ORDER — PHENYLEPHRINE HCL 10 % OP SOLN
1.0000 [drp] | OPHTHALMIC | Status: DC | PRN
  Filled 2017-10-14: qty 5

## 2017-10-14 MED ORDER — PHENYLEPHRINE 40 MCG/ML (10ML) SYRINGE FOR IV PUSH (FOR BLOOD PRESSURE SUPPORT)
PREFILLED_SYRINGE | INTRAVENOUS | Status: AC
  Filled 2017-10-14: qty 20

## 2017-10-14 MED ORDER — ONDANSETRON HCL 4 MG/2ML IJ SOLN: 4.0000 mg | Freq: Once | INTRAMUSCULAR | Status: DC | PRN

## 2017-10-14 MED ORDER — EPHEDRINE 5 MG/ML INJ
INTRAVENOUS | Status: AC
  Filled 2017-10-14: qty 10

## 2017-10-14 MED ORDER — STERILE WATER FOR INJECTION IJ SOLN
INTRAMUSCULAR | Status: AC
  Filled 2017-10-14: qty 20

## 2017-10-14 MED ORDER — SODIUM CHLORIDE 0.9 % IV SOLN
INTRAVENOUS | Status: DC | PRN
  Administered 2017-10-14: 20 ug/min via INTRAVENOUS

## 2017-10-14 MED ORDER — PREDNISOLONE ACETATE 1 % OP SUSP
1.0000 [drp] | Freq: Four times a day (QID) | OPHTHALMIC | Status: DC
  Filled 2017-10-14: qty 5

## 2017-10-14 MED ORDER — TETRACAINE HCL 0.5 % OP SOLN
2.0000 [drp] | Freq: Once | OPHTHALMIC | Status: DC
  Filled 2017-10-14: qty 4

## 2017-10-14 MED ORDER — ONDANSETRON HCL 4 MG/2ML IJ SOLN: 4.0000 mg | Freq: Four times a day (QID) | INTRAMUSCULAR | Status: DC | PRN

## 2017-10-14 MED ORDER — PHENYLEPHRINE HCL 2.5 % OP SOLN
1.0000 [drp] | OPHTHALMIC | Status: AC | PRN
  Administered 2017-10-14 (×3): 1 [drp] via OPHTHALMIC
  Filled 2017-10-14: qty 2

## 2017-10-14 MED ORDER — BSS IO SOLN
INTRAOCULAR | Status: AC
  Filled 2017-10-14: qty 15

## 2017-10-14 MED ORDER — TEMAZEPAM 15 MG PO CAPS: 15.0000 mg | ORAL_CAPSULE | Freq: Every evening | ORAL | Status: DC | PRN

## 2017-10-14 MED ORDER — HYPROMELLOSE (GONIOSCOPIC) 2.5 % OP SOLN
OPHTHALMIC | Status: AC
  Filled 2017-10-14: qty 15

## 2017-10-14 MED ORDER — DORZOLAMIDE HCL 2 % OP SOLN
1.0000 [drp] | Freq: Three times a day (TID) | OPHTHALMIC | Status: DC
  Filled 2017-10-14: qty 10

## 2017-10-14 MED ORDER — TROPICAMIDE 1 % OP SOLN
1.0000 [drp] | OPHTHALMIC | Status: AC | PRN
  Administered 2017-10-14 (×3): 1 [drp] via OPHTHALMIC
  Filled 2017-10-14: qty 15

## 2017-10-14 MED ORDER — FENTANYL CITRATE (PF) 250 MCG/5ML IJ SOLN
INTRAMUSCULAR | Status: DC | PRN
  Administered 2017-10-14: 50 ug via INTRAVENOUS
  Administered 2017-10-14: 100 ug via INTRAVENOUS

## 2017-10-14 MED ORDER — SODIUM CHLORIDE 0.9 % IJ SOLN
INTRAMUSCULAR | Status: AC
  Filled 2017-10-14: qty 10

## 2017-10-14 MED ORDER — BACITRACIN-POLYMYXIN B 500-10000 UNIT/GM OP OINT
TOPICAL_OINTMENT | OPHTHALMIC | Status: DC | PRN
  Administered 2017-10-14: 1 via OPHTHALMIC

## 2017-10-14 MED ORDER — BRIMONIDINE TARTRATE 0.2 % OP SOLN
1.0000 [drp] | Freq: Two times a day (BID) | OPHTHALMIC | Status: DC
  Filled 2017-10-14: qty 5

## 2017-10-14 MED ORDER — ONDANSETRON HCL 4 MG/2ML IJ SOLN
INTRAMUSCULAR | Status: DC | PRN
  Administered 2017-10-14 (×2): 4 mg via INTRAVENOUS

## 2017-10-14 MED ORDER — EPHEDRINE SULFATE 50 MG/ML IJ SOLN
INTRAMUSCULAR | Status: DC | PRN
  Administered 2017-10-14 (×4): 5 mg via INTRAVENOUS

## 2017-10-14 MED ORDER — BACITRACIN-POLYMYXIN B 500-10000 UNIT/GM OP OINT
TOPICAL_OINTMENT | OPHTHALMIC | Status: AC
  Filled 2017-10-14: qty 3.5

## 2017-10-14 MED ORDER — GATIFLOXACIN 0.5 % OP SOLN
1.0000 [drp] | OPHTHALMIC | Status: AC | PRN
  Administered 2017-10-14 (×3): 1 [drp] via OPHTHALMIC
  Filled 2017-10-14: qty 2.5

## 2017-10-14 MED ORDER — TRIAMCINOLONE ACETONIDE 40 MG/ML IJ SUSP
INTRAMUSCULAR | Status: DC | PRN
  Administered 2017-10-14: 40 mg

## 2017-10-14 MED ORDER — MIDAZOLAM HCL 5 MG/5ML IJ SOLN
INTRAMUSCULAR | Status: DC | PRN
  Administered 2017-10-14: 2 mg via INTRAVENOUS

## 2017-10-14 MED ORDER — BUPIVACAINE HCL (PF) 0.75 % IJ SOLN
INTRAMUSCULAR | Status: AC
  Filled 2017-10-14: qty 10

## 2017-10-14 MED ORDER — ATROPINE SULFATE 1 % OP SOLN
OPHTHALMIC | Status: AC
  Filled 2017-10-14: qty 5

## 2017-10-14 MED ORDER — LIDOCAINE 2% (20 MG/ML) 5 ML SYRINGE
INTRAMUSCULAR | Status: DC | PRN
  Administered 2017-10-14: 80 mg via INTRAVENOUS

## 2017-10-14 MED ORDER — IRBESARTAN 75 MG PO TABS
75.0000 mg | ORAL_TABLET | Freq: Every day | ORAL | Status: DC
  Administered 2017-10-14: 75 mg via ORAL
  Filled 2017-10-14: qty 1

## 2017-10-14 MED ORDER — DEXAMETHASONE SODIUM PHOSPHATE 10 MG/ML IJ SOLN
INTRAMUSCULAR | Status: DC | PRN
  Administered 2017-10-14: 10 mg

## 2017-10-14 MED ORDER — BSS PLUS IO SOLN
INTRAOCULAR | Status: AC
  Filled 2017-10-14: qty 500

## 2017-10-14 MED ORDER — 0.9 % SODIUM CHLORIDE (POUR BTL) OPTIME
TOPICAL | Status: DC | PRN
  Administered 2017-10-14: 200 mL

## 2017-10-14 MED ORDER — GATIFLOXACIN 0.5 % OP SOLN
1.0000 [drp] | Freq: Four times a day (QID) | OPHTHALMIC | Status: DC
  Filled 2017-10-14: qty 2.5

## 2017-10-14 MED ORDER — SODIUM HYALURONATE 10 MG/ML IO SOLN
INTRAOCULAR | Status: AC
  Filled 2017-10-14: qty 0.85

## 2017-10-14 MED ORDER — ACETAZOLAMIDE SODIUM 500 MG IJ SOLR
500.0000 mg | Freq: Once | INTRAMUSCULAR | Status: AC
  Administered 2017-10-15: 500 mg via INTRAVENOUS
  Filled 2017-10-14: qty 500

## 2017-10-14 MED ORDER — CEFAZOLIN SODIUM-DEXTROSE 2-4 GM/100ML-% IV SOLN
2.0000 g | INTRAVENOUS | Status: AC
  Administered 2017-10-14: 2 g via INTRAVENOUS
  Filled 2017-10-14: qty 100

## 2017-10-14 MED ORDER — METOPROLOL SUCCINATE ER 100 MG PO TB24
100.0000 mg | ORAL_TABLET | Freq: Every day | ORAL | Status: DC
  Administered 2017-10-14: 100 mg via ORAL
  Filled 2017-10-14: qty 1

## 2017-10-14 MED ORDER — POLYMYXIN B SULFATE 500000 UNITS IJ SOLR
INTRAMUSCULAR | Status: AC
  Filled 2017-10-14: qty 500000

## 2017-10-14 MED ORDER — BUPIVACAINE HCL (PF) 0.75 % IJ SOLN
INTRAMUSCULAR | Status: DC | PRN
  Administered 2017-10-14: 10 mL

## 2017-10-14 MED ORDER — SUGAMMADEX SODIUM 200 MG/2ML IV SOLN
INTRAVENOUS | Status: DC | PRN
  Administered 2017-10-14: 200 mg via INTRAVENOUS

## 2017-10-14 MED ORDER — PROPOFOL 10 MG/ML IV BOLUS
INTRAVENOUS | Status: DC | PRN
  Administered 2017-10-14: 150 mg via INTRAVENOUS

## 2017-10-14 MED ORDER — MAGNESIUM HYDROXIDE 400 MG/5ML PO SUSP: 15.0000 mL | Freq: Four times a day (QID) | ORAL | Status: DC | PRN

## 2017-10-14 MED ORDER — HEMOSTATIC AGENTS (NO CHARGE) OPTIME
TOPICAL | Status: DC | PRN
  Administered 2017-10-14: 1 via TOPICAL

## 2017-10-14 MED ORDER — SODIUM CHLORIDE 0.45 % IV SOLN
INTRAVENOUS | Status: DC
  Administered 2017-10-14: 18:00:00 via INTRAVENOUS

## 2017-10-14 MED ORDER — BACITRACIN-POLYMYXIN B 500-10000 UNIT/GM OP OINT
1.0000 "application " | TOPICAL_OINTMENT | Freq: Three times a day (TID) | OPHTHALMIC | Status: DC
  Filled 2017-10-14: qty 3.5

## 2017-10-14 MED ORDER — ACETAMINOPHEN 325 MG PO TABS: 325.0000 mg | ORAL_TABLET | ORAL | Status: DC | PRN

## 2017-10-14 MED ORDER — PROPOFOL 10 MG/ML IV BOLUS
INTRAVENOUS | Status: AC
  Filled 2017-10-14: qty 20

## 2017-10-14 MED ORDER — CEFTAZIDIME 1 G IJ SOLR
INTRAMUSCULAR | Status: AC
  Filled 2017-10-14: qty 1

## 2017-10-14 MED ORDER — DEXAMETHASONE SODIUM PHOSPHATE 10 MG/ML IJ SOLN
INTRAMUSCULAR | Status: DC | PRN
  Administered 2017-10-14: 10 mg via INTRAVENOUS

## 2017-10-14 MED ORDER — ROCURONIUM BROMIDE 50 MG/5ML IV SOSY
PREFILLED_SYRINGE | INTRAVENOUS | Status: DC | PRN
  Administered 2017-10-14: 20 mg via INTRAVENOUS
  Administered 2017-10-14: 50 mg via INTRAVENOUS
  Administered 2017-10-14: 30 mg via INTRAVENOUS

## 2017-10-14 MED ORDER — MIDAZOLAM HCL 2 MG/2ML IJ SOLN
INTRAMUSCULAR | Status: AC
  Filled 2017-10-14: qty 2

## 2017-10-14 MED ORDER — FENTANYL CITRATE (PF) 100 MCG/2ML IJ SOLN: 25.0000 ug | INTRAMUSCULAR | Status: DC | PRN

## 2017-10-14 MED ORDER — FENTANYL CITRATE (PF) 250 MCG/5ML IJ SOLN
INTRAMUSCULAR | Status: AC
  Filled 2017-10-14: qty 5

## 2017-10-14 MED ORDER — LACTATED RINGERS IV SOLN
INTRAVENOUS | Status: DC
  Administered 2017-10-14: 10:00:00 via INTRAVENOUS

## 2017-10-14 MED ORDER — SODIUM HYALURONATE 10 MG/ML IO SOLN
INTRAOCULAR | Status: DC | PRN
  Administered 2017-10-14: 0.85 mL via INTRAOCULAR

## 2017-10-14 MED ORDER — LATANOPROST 0.005 % OP SOLN
1.0000 [drp] | Freq: Every day | OPHTHALMIC | Status: DC
  Filled 2017-10-14: qty 2.5

## 2017-10-14 MED ORDER — HYDROCODONE-ACETAMINOPHEN 5-325 MG PO TABS
1.0000 | ORAL_TABLET | ORAL | Status: DC | PRN
  Administered 2017-10-14: 1 via ORAL
  Filled 2017-10-14: qty 1

## 2017-10-14 MED ORDER — ROSUVASTATIN CALCIUM 5 MG PO TABS
5.0000 mg | ORAL_TABLET | Freq: Every day | ORAL | Status: DC
  Administered 2017-10-14: 5 mg via ORAL
  Filled 2017-10-14: qty 1

## 2017-10-14 MED ORDER — STERILE WATER FOR IRRIGATION IR SOLN
Status: DC | PRN
  Administered 2017-10-14: 200 mL

## 2017-10-14 SURGICAL SUPPLY — 72 items
APPLICATOR DR MATTHEWS STRL (MISCELLANEOUS) IMPLANT
BLADE EYE CATARACT 19 1.4 BEAV (BLADE) IMPLANT
BLADE KERATOME 2.75 (BLADE) ×2 IMPLANT
BLADE MVR KNIFE 20G (BLADE) ×1 IMPLANT
CANNULA VLV SOFT TIP 25G (OPHTHALMIC) ×1 IMPLANT
CANNULA VLV SOFT TIP 25GA (OPHTHALMIC) ×2 IMPLANT
CORDS BIPOLAR (ELECTRODE) ×2 IMPLANT
COTTONBALL LRG STERILE PKG (GAUZE/BANDAGES/DRESSINGS) ×6 IMPLANT
COVER MAYO STAND STRL (DRAPES) ×2 IMPLANT
COVER WAND RF STERILE (DRAPES) ×2 IMPLANT
DRAPE INCISE 51X51 W/FILM STRL (DRAPES) ×1 IMPLANT
DRAPE OPHTHALMIC 77X100 STRL (CUSTOM PROCEDURE TRAY) ×2 IMPLANT
EAGLE VIT/RET MICRO PIC 168 25 (MISCELLANEOUS) ×1 IMPLANT
FILTER BLUE MILLIPORE (MISCELLANEOUS) ×1 IMPLANT
FORCEPS ECKARDT ILM 25G SERR (OPHTHALMIC RELATED) IMPLANT
FORCEPS GRIESHABER ILM 25G A (INSTRUMENTS) ×2 IMPLANT
FORCEPS HORIZONTAL 25G DISP (OPHTHALMIC RELATED) IMPLANT
GAS OPHTHALMIC (MISCELLANEOUS) IMPLANT
GLOVE SS BIOGEL STRL SZ 6.5 (GLOVE) ×2 IMPLANT
GLOVE SS BIOGEL STRL SZ 7 (GLOVE) ×1 IMPLANT
GLOVE SUPERSENSE BIOGEL SZ 6.5 (GLOVE) ×2
GLOVE SUPERSENSE BIOGEL SZ 7 (GLOVE) ×1
GLOVE SURG 8.5 LATEX PF (GLOVE) ×2 IMPLANT
GLOVE SURG SS PI 6.0 STRL IVOR (GLOVE) ×2 IMPLANT
GLOVE SURG SS PI 6.5 STRL IVOR (GLOVE) ×2 IMPLANT
GLOVE SURG SS PI 7.0 STRL IVOR (GLOVE) ×2 IMPLANT
GOWN STRL REUS W/ TWL LRG LVL3 (GOWN DISPOSABLE) ×3 IMPLANT
GOWN STRL REUS W/TWL LRG LVL3 (GOWN DISPOSABLE) ×7
HANDLE PNEUMATIC FOR CONSTEL (OPHTHALMIC) ×2 IMPLANT
KIT BASIN OR (CUSTOM PROCEDURE TRAY) ×2 IMPLANT
KIT TURNOVER KIT B (KITS) ×2 IMPLANT
KNIFE CRESCENT 1.75 EDGEAHEAD (BLADE) IMPLANT
LENS IOL POST 1PIECE DIOP 25.0 ×1 IMPLANT
MICROPICK 25G (MISCELLANEOUS) ×2
NDL 18GX1X1/2 (RX/OR ONLY) (NEEDLE) ×1 IMPLANT
NDL 25GX 5/8IN NON SAFETY (NEEDLE) ×1 IMPLANT
NDL 27GX1/2 REG BEVEL ECLIP (NEEDLE) IMPLANT
NDL FILTER BLUNT 18X1 1/2 (NEEDLE) ×1 IMPLANT
NDL HYPO 30X.5 LL (NEEDLE) ×1 IMPLANT
NEEDLE 18GX1X1/2 (RX/OR ONLY) (NEEDLE) ×2 IMPLANT
NEEDLE 25GX 5/8IN NON SAFETY (NEEDLE) ×2 IMPLANT
NEEDLE 27GX1/2 REG BEVEL ECLIP (NEEDLE) ×2 IMPLANT
NEEDLE FILTER BLUNT 18X 1/2SAF (NEEDLE) ×1
NEEDLE FILTER BLUNT 18X1 1/2 (NEEDLE) ×1 IMPLANT
NEEDLE HYPO 30X.5 LL (NEEDLE) ×2 IMPLANT
NS IRRIG 1000ML POUR BTL (IV SOLUTION) ×2 IMPLANT
PACK VITRECTOMY CUSTOM (CUSTOM PROCEDURE TRAY) ×2 IMPLANT
PAD ARMBOARD 7.5X6 YLW CONV (MISCELLANEOUS) ×4 IMPLANT
PAK PIK VITRECTOMY CVS 25GA (OPHTHALMIC) ×2 IMPLANT
PENCIL BIPOLAR 25GA STR DISP (OPHTHALMIC RELATED) IMPLANT
PIC ILLUMINATED 25G (OPHTHALMIC) ×2
PICK MICROPICK 25G (MISCELLANEOUS) IMPLANT
PIK ILLUMINATED 25G (OPHTHALMIC) ×1 IMPLANT
PROBE LASER ILLUM FLEX CVD 25G (OPHTHALMIC) IMPLANT
ROLLS DENTAL (MISCELLANEOUS) ×4 IMPLANT
SCRAPER DIAMOND 25GA (OPHTHALMIC RELATED) IMPLANT
SCRAPER DIAMOND DUST MEMBRANE (MISCELLANEOUS) ×1 IMPLANT
SPONGE SURGIFOAM ABS GEL 12-7 (HEMOSTASIS) ×2 IMPLANT
STOPCOCK 4 WAY LG BORE MALE ST (IV SETS) IMPLANT
SUT CHROMIC 7 0 TG140 8 (SUTURE) ×2 IMPLANT
SUT ETHILON 10 0 CS140 6 (SUTURE) ×2 IMPLANT
SUT ETHILON 9 0 TG140 8 (SUTURE) ×1 IMPLANT
SUT POLY NON ABSORB 10-0 8 STR (SUTURE) ×5 IMPLANT
SUT SILK 4 0 RB 1 (SUTURE) ×1 IMPLANT
SUT VICRYL 7 0 TG140 8 (SUTURE) ×1 IMPLANT
SYR 10ML LL (SYRINGE) ×2 IMPLANT
SYR 20CC LL (SYRINGE) ×2 IMPLANT
SYR 5ML LL (SYRINGE) IMPLANT
SYR BULB 3OZ (MISCELLANEOUS) ×2 IMPLANT
SYR TB 1ML LUER SLIP (SYRINGE) ×2 IMPLANT
TUBING HIGH PRESS EXTEN 6IN (TUBING) IMPLANT
WATER STERILE IRR 1000ML POUR (IV SOLUTION) ×2 IMPLANT

## 2017-10-14 NOTE — Brief Op Note (Signed)
Brief Operative note   Preoperative diagnosis:  Dislocated intra-ocular lens  preretinal fibrosis, Right eye Postoperative diagnosis  * No Diagnosis Codes entered *  Procedures: Pars plana vitretcomy, membrane peel, removal of intraocular lens from vitreous.  Placement of secondary intraocular lens with suture.  Laser, gas injection right eye  Surgeon:  Hayden Pedro, MD...  Assistant:  Deatra Ina SA    Anesthesia: General  Specimen: none  Estimated blood loss:  1cc  Complications: none  Patient sent to PACU in good condition  Composed by Hayden Pedro MD  Dictation number: [phone]

## 2017-10-14 NOTE — H&P (Signed)
I examined the patient today and there is no change in the medical status 

## 2017-10-14 NOTE — Anesthesia Postprocedure Evaluation (Signed)
Anesthesia Post Note  Patient: Antonio Acosta  Procedure(s) Performed: RIGHT 25G PARS PLANA VITRECTOMY WITH REMOVABLE INTRAOCULAR LENS FROM VITREOUS; PLACEMENT OF SECONDARY INTRAOCULAR LENS WITH SUTURE (Right Eye) LASER PHOTO ABLATION RIGHT EYE (Right Eye) RIGHT AIR/FLUID EXCHANGE (Right Eye) RIGHT MEMBRANE PEEL (Right Eye)     Patient location during evaluation: PACU Anesthesia Type: General Level of consciousness: awake and alert Pain management: pain level controlled Vital Signs Assessment: post-procedure vital signs reviewed and stable Respiratory status: spontaneous breathing, nonlabored ventilation, respiratory function stable and patient connected to nasal cannula oxygen Cardiovascular status: blood pressure returned to baseline and stable Postop Assessment: no apparent nausea or vomiting Anesthetic complications: no    Last Vitals:  Vitals:   10/14/17 1522 10/14/17 1812  BP: 120/69 132/68  Pulse: 86 93  Resp: 17 17  Temp:  36.6 C  SpO2: 96% 97%    Last Pain:  Vitals:   10/14/17 1812  TempSrc: Oral  PainSc:                  Karyl Kinnier Jacquez Sheetz

## 2017-10-14 NOTE — Op Note (Signed)
Antonio Acosta, OKERLUND MEDICAL RECORD [id number] ACCOUNT [id number] DATE OF BIRTH:07-17-1950 FACILITY: [facility] LOCATION: [place], [place]  OPERATIVE REPORT  DATE OF PROCEDURE:  10/14/2017  ADMISSION DIAGNOSIS:  Dislocated intraocular lens into the posterior vitreous, right eye and epiretinal membrane, right eye.  PROCEDURES:  Pars plana vitrectomy, removal of intraocular lens from the vitreous, membrane peel, placement of secondary intraocular lens with suture, laser photocoagulation, gas injection all in the right eye.  SURGEON:  Tempie Hoist, MD  ASSISTANT:  Deatra Ina.  ANESTHESIA:  General.  DESCRIPTION OF PROCEDURE:  Usual prep and drape.  The indirect ophthalmoscope laser was moved into place and 1168 burns were placed around the retinal periphery in the areas of weak retina.  The power was between 740 and 800 milliwatts, size 1000 microns  each and 0.1 seconds each.  Attention was then carried to the pars plana area where conjunctiva was incised and a peritomy was created from 8 o'clock to 4 o'clock to half thickness scleral flaps were raised at 3 and 9 o'clock in anticipation of IOL  suture.  A 7 mm wound was made beginning back on the sclera and going in a 3-layered fashion up into the cornea.  Then, 25 gauge trocars were placed at 8 and 10 o'clock with MVR incision at 2 o'clock.  Pars plana vitrectomy was begun in the pupillary  axis.  The intraocular lens was seen to be loose in the vitreous cavity.  The vitrectomy was carried posteriorly and vitreous membranes were encountered.  These were carefully removed under low suction and rapid cutting. The sector photocoagulation scars  could be seen in the macula and epiretinal membrane was seen.  The membrane peel was begun with the diamond dusted membrane scraper.  No edge of the membrane was encountered.  Therefore, the 27 gauge Eagle pick was used to incise the membrane at 12  o'clock on the superior macula.   The membrane was lifted with the Eagle pick and peeled across the macular region in one single sheet.  ILM forceps were used to remove the membrane and to extend the removal of the membrane out to the vascular arcades.   The cutter was repositioned in the eye and the membrane was trimmed and removed with the vitreous cutter.  The diamond dusted membrane scraper again was used to smooth the macular region.  The sapphire lenses both flat and magnifying were used for this  maneuver.  The sapphire lens ring was removed.  The vitrectomy was carried into the periphery with the Biome viewing system and all vitreous was removed from around the intraocular lens.  Keratome incision was made in the corneal scleral wound between 10  and 2 o'clock.  The intraocular lens was grasped with a Kuglen hook after being passed from the vitreous cavity and brought out through the corneal scleral wound.  Additional vitrectomy was carried out to assure no vitreous to the wound was left.  A new  intraocular lens model [id number] made by [employer] power 25 OD length 12.5 mm optic 7.0 mm, expiration date 05/07/2018 was brought onto the field.  Serial number was [id number].  The lens was brought on the field inspected and cleaned.  Two  10-0 Prolene sutures were passed beneath the scleral flap from 3 o'clock to 9 o'clock behind the iris and in the ciliary sulcus.  A docking suture was used to exit the needle beneath the scleral flap.  The two scleral sutures were pulled out  through the  corneal scleral wound with 25 gauge vitreous forceps.  The Prolene sutures were attached to the islets of the lens.  The lens was cleaned again and placed into the eye with sutures attached.  The lens was placed into the posterior chamber and dialed into  place.  The sutures were drawn securely and nodded and the free ends removed.  The flaps were allowed to cover the Prolene knots.  The cornea was closed with 5 interrupted 10-0 nylon  sutures.  The wound was tested and found to be secure.  Additional  vitrectomy was carried out with the Biome at this point, so that all pigment and vitreous was removed from the vitreous cavity.  A 30% gas fluid exchange was carried out at this time.  Kenalog 0.05 mL was injected into the lower vitreous.  The MVR  incision was closed with 9-0 interrupted suture.  The 25 gauge trocars were removed.  Additional sterile room air was injected to obtain a closing pressure of 10 with a Barraquer tonometer.  The conjunctiva was reposited with 7-0 chromic suture.   Polymyxin and ceftazidime were rinsed around the globe for antibiotic coverage.  Decadron 10 mg was injected into the lower subconjunctival space.  Marcaine was injected around the globe for postoperative pain.  Polysporin ophthalmic ointment, a patch  and a shield were placed.  Closing pressure was 10 with a Barraquer tonometer.  COMPLICATIONS:  None.  DURATION:  2 hours 30 minutes.  TN/NUANCE  D:10/14/2017 T:10/14/2017 JOB:[id number]

## 2017-10-14 NOTE — Transfer of Care (Signed)
Immediate Anesthesia Transfer of Care Note  Patient: Antonio Acosta  Procedure(s) Performed: RIGHT PARS PLANA VITRECTOMY WITH 25G SUTURE INTRAOCULAR LENS (Right Eye)  Patient Location: PACU  Anesthesia Type:General  Level of Consciousness: awake, alert , oriented and patient cooperative  Airway & Oxygen Therapy: Patient Spontanous Breathing  Post-op Assessment: Report given to RN and Post -op Vital signs reviewed and stable  Post vital signs: Reviewed and stable  Last Vitals:  Vitals Value Taken Time  BP 137/65 10/14/2017  2:31 PM  Temp    Pulse 92 10/14/2017  2:37 PM  Resp 18 10/14/2017  2:37 PM  SpO2 96 % 10/14/2017  2:37 PM  Vitals shown include unvalidated device data.  Last Pain:  Vitals:   10/14/17 0916  TempSrc: Oral      Patients Stated Pain Goal: 8 (73/40/37 0964)  Complications: No apparent anesthesia complications

## 2017-10-14 NOTE — Anesthesia Preprocedure Evaluation (Addendum)
Anesthesia Evaluation  Patient identified by MRN, date of birth, ID band Patient awake    Reviewed: Allergy & Precautions, NPO status , Patient's Chart, lab work & pertinent test results, reviewed documented beta blocker date and time   Airway Mallampati: III  TM Distance: >3 FB Neck ROM: Full    Dental no notable dental hx.    Pulmonary neg pulmonary ROS, former smoker,    Pulmonary exam normal breath sounds clear to auscultation       Cardiovascular hypertension, Pt. on medications and Pt. on home beta blockers Normal cardiovascular exam Rhythm:Regular Rate:Normal  ECG: SR, rate 60   Neuro/Psych PSYCHIATRIC DISORDERS negative neurological ROS     GI/Hepatic negative GI ROS, (+)     substance abuse  ,   Endo/Other  negative endocrine ROS  Renal/GU negative Renal ROS     Musculoskeletal HLD   Abdominal   Peds  Hematology negative hematology ROS (+)   Anesthesia Other Findings Dislocated intra ocular lens right eye  Reproductive/Obstetrics                            Anesthesia Physical Anesthesia Plan  ASA: II  Anesthesia Plan: General   Post-op Pain Management:    Induction: Intravenous  PONV Risk Score and Plan: 2 and Midazolam, Dexamethasone, Ondansetron and Treatment may vary due to age or medical condition  Airway Management Planned: Oral ETT  Additional Equipment:   Intra-op Plan:   Post-operative Plan: Extubation in OR  Informed Consent: I have reviewed the patients History and Physical, chart, labs and discussed the procedure including the risks, benefits and alternatives for the proposed anesthesia with the patient or authorized representative who has indicated his/her understanding and acceptance.   Dental advisory given  Plan Discussed with: CRNA  Anesthesia Plan Comments:         Anesthesia Quick Evaluation

## 2017-10-15 ENCOUNTER — Encounter (HOSPITAL_COMMUNITY): Payer: Self-pay | Admitting: Ophthalmology

## 2017-10-15 DIAGNOSIS — I10 Essential (primary) hypertension: Secondary | ICD-10-CM | POA: Diagnosis not present

## 2017-10-15 DIAGNOSIS — Z79899 Other long term (current) drug therapy: Secondary | ICD-10-CM | POA: Diagnosis not present

## 2017-10-15 DIAGNOSIS — Z87891 Personal history of nicotine dependence: Secondary | ICD-10-CM | POA: Diagnosis not present

## 2017-10-15 DIAGNOSIS — T8522XA Displacement of intraocular lens, initial encounter: Secondary | ICD-10-CM | POA: Diagnosis not present

## 2017-10-15 DIAGNOSIS — H3589 Other specified retinal disorders: Secondary | ICD-10-CM | POA: Diagnosis not present

## 2017-10-15 MED ORDER — PREDNISOLONE ACETATE 1 % OP SUSP: 1.0000 [drp] | Freq: Four times a day (QID) | OPHTHALMIC | 0 refills | Status: DC

## 2017-10-15 MED ORDER — BACITRACIN-POLYMYXIN B 500-10000 UNIT/GM OP OINT: 1.0000 "application " | TOPICAL_OINTMENT | Freq: Three times a day (TID) | OPHTHALMIC | 0 refills | Status: DC

## 2017-10-15 MED ORDER — DORZOLAMIDE HCL 2 % OP SOLN: 1.0000 [drp] | Freq: Three times a day (TID) | OPHTHALMIC | 12 refills | Status: DC

## 2017-10-15 MED ORDER — GATIFLOXACIN 0.5 % OP SOLN: 1.0000 [drp] | Freq: Four times a day (QID) | OPHTHALMIC | Status: DC

## 2017-10-15 MED ORDER — BRIMONIDINE TARTRATE 0.2 % OP SOLN: 1.0000 [drp] | Freq: Two times a day (BID) | OPHTHALMIC | 12 refills | Status: DC

## 2017-10-15 NOTE — Progress Notes (Signed)
10/15/2017, 6:30 AM  Mental Status:  Awake, Alert, Oriented  Sees clock and calendar date with operative eye  Anterior segment: Cornea  Clear    Anterior Chamber Clear    Lens:    IOL in place   Intra Ocular Pressure 12 mmHg with Tonopen  Vitreous: Clear 20%gas bubble     Retina:  Attached Good laser reaction Hemorrhage    Impression: Excellent result Retina attached  Final Diagnosis: Active Problems:   Posterior dislocation of lens of right eye   Plan: start post operative eye drops.  Discharge to home.  Give post operative instructions  Antonio Acosta 10/15/2017, 6:30 AM

## 2017-10-15 NOTE — Discharge Summary (Signed)
Discharge summary not needed on [facility] patients per medical records. 

## 2017-10-15 NOTE — Progress Notes (Signed)
Pt discharged home in stable condition 

## 2017-10-15 NOTE — Progress Notes (Signed)
Pt for discharge per Dr. Zigmund Daniel order, discharge instruction given, no complain of pain at this time.

## 2017-10-21 ENCOUNTER — Encounter (INDEPENDENT_AMBULATORY_CARE_PROVIDER_SITE_OTHER): Payer: Medicare Other | Admitting: Ophthalmology

## 2017-10-21 DIAGNOSIS — H2701 Aphakia, right eye: Secondary | ICD-10-CM

## 2017-11-11 ENCOUNTER — Encounter (INDEPENDENT_AMBULATORY_CARE_PROVIDER_SITE_OTHER): Payer: Medicare Other | Admitting: Ophthalmology

## 2017-11-11 DIAGNOSIS — H2701 Aphakia, right eye: Secondary | ICD-10-CM

## 2017-11-13 DIAGNOSIS — H40022 Open angle with borderline findings, high risk, left eye: Secondary | ICD-10-CM | POA: Diagnosis not present

## 2017-11-13 DIAGNOSIS — H401111 Primary open-angle glaucoma, right eye, mild stage: Secondary | ICD-10-CM | POA: Diagnosis not present

## 2017-11-14 DIAGNOSIS — Z23 Encounter for immunization: Secondary | ICD-10-CM | POA: Diagnosis not present

## 2017-11-15 ENCOUNTER — Encounter: Payer: Self-pay | Admitting: Internal Medicine

## 2017-12-10 ENCOUNTER — Encounter (INDEPENDENT_AMBULATORY_CARE_PROVIDER_SITE_OTHER): Payer: Medicare Other | Admitting: Ophthalmology

## 2017-12-10 DIAGNOSIS — H34831 Tributary (branch) retinal vein occlusion, right eye, with macular edema: Secondary | ICD-10-CM

## 2017-12-10 DIAGNOSIS — H2701 Aphakia, right eye: Secondary | ICD-10-CM

## 2017-12-25 DIAGNOSIS — H40022 Open angle with borderline findings, high risk, left eye: Secondary | ICD-10-CM | POA: Diagnosis not present

## 2017-12-25 DIAGNOSIS — H401111 Primary open-angle glaucoma, right eye, mild stage: Secondary | ICD-10-CM | POA: Diagnosis not present

## 2017-12-25 DIAGNOSIS — Z961 Presence of intraocular lens: Secondary | ICD-10-CM | POA: Diagnosis not present

## 2018-01-08 ENCOUNTER — Encounter (INDEPENDENT_AMBULATORY_CARE_PROVIDER_SITE_OTHER): Payer: Medicare Other | Admitting: Ophthalmology

## 2018-01-08 DIAGNOSIS — I10 Essential (primary) hypertension: Secondary | ICD-10-CM

## 2018-01-08 DIAGNOSIS — D3132 Benign neoplasm of left choroid: Secondary | ICD-10-CM | POA: Diagnosis not present

## 2018-01-08 DIAGNOSIS — H35033 Hypertensive retinopathy, bilateral: Secondary | ICD-10-CM | POA: Diagnosis not present

## 2018-01-08 DIAGNOSIS — H34831 Tributary (branch) retinal vein occlusion, right eye, with macular edema: Secondary | ICD-10-CM

## 2018-01-08 DIAGNOSIS — H35371 Puckering of macula, right eye: Secondary | ICD-10-CM | POA: Diagnosis not present

## 2018-01-08 DIAGNOSIS — H43813 Vitreous degeneration, bilateral: Secondary | ICD-10-CM | POA: Diagnosis not present

## 2018-02-05 ENCOUNTER — Encounter (INDEPENDENT_AMBULATORY_CARE_PROVIDER_SITE_OTHER): Payer: Medicare Other | Admitting: Ophthalmology

## 2018-02-05 DIAGNOSIS — I10 Essential (primary) hypertension: Secondary | ICD-10-CM

## 2018-02-05 DIAGNOSIS — H43812 Vitreous degeneration, left eye: Secondary | ICD-10-CM

## 2018-02-05 DIAGNOSIS — H35033 Hypertensive retinopathy, bilateral: Secondary | ICD-10-CM

## 2018-02-05 DIAGNOSIS — H34831 Tributary (branch) retinal vein occlusion, right eye, with macular edema: Secondary | ICD-10-CM | POA: Diagnosis not present

## 2018-02-05 DIAGNOSIS — D3132 Benign neoplasm of left choroid: Secondary | ICD-10-CM

## 2018-02-05 DIAGNOSIS — H2512 Age-related nuclear cataract, left eye: Secondary | ICD-10-CM

## 2018-02-25 ENCOUNTER — Encounter: Payer: Self-pay | Admitting: Internal Medicine

## 2018-03-06 ENCOUNTER — Other Ambulatory Visit: Payer: Self-pay | Admitting: Internal Medicine

## 2018-03-06 ENCOUNTER — Encounter (INDEPENDENT_AMBULATORY_CARE_PROVIDER_SITE_OTHER): Payer: Medicare Other | Admitting: Ophthalmology

## 2018-03-06 DIAGNOSIS — D3132 Benign neoplasm of left choroid: Secondary | ICD-10-CM | POA: Diagnosis not present

## 2018-03-06 DIAGNOSIS — H43812 Vitreous degeneration, left eye: Secondary | ICD-10-CM

## 2018-03-06 DIAGNOSIS — H34831 Tributary (branch) retinal vein occlusion, right eye, with macular edema: Secondary | ICD-10-CM

## 2018-03-06 DIAGNOSIS — H35033 Hypertensive retinopathy, bilateral: Secondary | ICD-10-CM | POA: Diagnosis not present

## 2018-03-06 DIAGNOSIS — I10 Essential (primary) hypertension: Secondary | ICD-10-CM | POA: Diagnosis not present

## 2018-03-09 ENCOUNTER — Other Ambulatory Visit: Payer: Self-pay | Admitting: Internal Medicine

## 2018-03-09 ENCOUNTER — Ambulatory Visit: Payer: Medicare Other | Admitting: Internal Medicine

## 2018-03-09 DIAGNOSIS — I10 Essential (primary) hypertension: Secondary | ICD-10-CM

## 2018-03-26 DIAGNOSIS — H40022 Open angle with borderline findings, high risk, left eye: Secondary | ICD-10-CM | POA: Diagnosis not present

## 2018-03-26 DIAGNOSIS — H401111 Primary open-angle glaucoma, right eye, mild stage: Secondary | ICD-10-CM | POA: Diagnosis not present

## 2018-04-02 ENCOUNTER — Other Ambulatory Visit: Payer: Self-pay | Admitting: Internal Medicine

## 2018-04-02 DIAGNOSIS — I10 Essential (primary) hypertension: Secondary | ICD-10-CM

## 2018-04-03 ENCOUNTER — Telehealth: Payer: Self-pay | Admitting: Internal Medicine

## 2018-04-03 NOTE — Telephone Encounter (Signed)
Pt. Calling in regard to his appointment Monday for medication refills. Contacted Sam in the practice and they will be calling pt.'s about possible virtual visits.

## 2018-04-06 ENCOUNTER — Ambulatory Visit (INDEPENDENT_AMBULATORY_CARE_PROVIDER_SITE_OTHER): Payer: Medicare Other | Admitting: Internal Medicine

## 2018-04-06 ENCOUNTER — Encounter: Payer: Self-pay | Admitting: Internal Medicine

## 2018-04-06 DIAGNOSIS — E785 Hyperlipidemia, unspecified: Secondary | ICD-10-CM

## 2018-04-06 DIAGNOSIS — I10 Essential (primary) hypertension: Secondary | ICD-10-CM | POA: Diagnosis not present

## 2018-04-06 MED ORDER — ROSUVASTATIN CALCIUM 5 MG PO TABS: 5.0000 mg | ORAL_TABLET | Freq: Every day | ORAL | 0 refills | Status: DC

## 2018-04-06 MED ORDER — METOPROLOL SUCCINATE ER 100 MG PO TB24: 100.0000 mg | ORAL_TABLET | Freq: Every day | ORAL | 0 refills | Status: DC

## 2018-04-06 MED ORDER — TELMISARTAN 80 MG PO TABS: 80.0000 mg | ORAL_TABLET | Freq: Every day | ORAL | 0 refills | Status: DC

## 2018-04-06 NOTE — Progress Notes (Signed)
Virtual Visit via Video Note  I connected with Antonio Acosta on 04/06/18 at 11:30 AM EDT by a video enabled telemedicine application and verified that I am speaking with the correct person using two identifiers.   I discussed the limitations of evaluation and management by telemedicine and the availability of in person appointments. The patient expressed understanding and agreed to proceed.  History of Present Illness:  The patient called in to monitor his blood pressure and his high cholesterol level.  He tells me he is compliant with rosuvastatin, Toprol, and telmisartan.  He wanted to make sure he had refills of these at his most recent pharmacy which is the [facility] in [place].  His only complaint was nontraumatic left heel pain for several weeks.  He had gotten an over-the-counter sleeve to wear over the area and it had some improvement in the symptoms.  He tells me his blood pressure has been well controlled with a systolic around 794-801 and a diastolic of about 80.  He denies any recent episodes of dizziness or lightheadedness.    Observations/Objective: He was alert, oriented, and in no acute distress.   Assessment and Plan: His blood pressure is adequately well controlled and he is tolerating the beta-blocker, ARB, and statin well.  The symptoms of left heel pain are consistent with plantar fasciitis.    Follow Up Instructions: He was advised to do some stretching exercises, apply ice several times a day, and to take an anti-inflammatory as needed.  He agrees to return in about 3 to 4 months for an office visit with labs and EKG.     I discussed the assessment and treatment plan with the patient. The patient was provided an opportunity to ask questions and all were answered. The patient agreed with the plan and demonstrated an understanding of the instructions.   The patient was advised to call back or seek an in-person evaluation if the symptoms worsen or if the condition fails to improve as  anticipated.  I provided 15 minutes of non-face-to-face time during this encounter.   Scarlette Calico, MD

## 2018-04-10 ENCOUNTER — Other Ambulatory Visit: Payer: Self-pay

## 2018-04-10 ENCOUNTER — Encounter (INDEPENDENT_AMBULATORY_CARE_PROVIDER_SITE_OTHER): Payer: Medicare Other | Admitting: Ophthalmology

## 2018-04-10 DIAGNOSIS — H43813 Vitreous degeneration, bilateral: Secondary | ICD-10-CM

## 2018-04-10 DIAGNOSIS — H34831 Tributary (branch) retinal vein occlusion, right eye, with macular edema: Secondary | ICD-10-CM

## 2018-04-10 DIAGNOSIS — H35033 Hypertensive retinopathy, bilateral: Secondary | ICD-10-CM

## 2018-04-10 DIAGNOSIS — I10 Essential (primary) hypertension: Secondary | ICD-10-CM

## 2018-04-27 ENCOUNTER — Other Ambulatory Visit: Payer: Self-pay | Admitting: Internal Medicine

## 2018-04-27 DIAGNOSIS — I10 Essential (primary) hypertension: Secondary | ICD-10-CM

## 2018-05-11 ENCOUNTER — Other Ambulatory Visit: Payer: Self-pay

## 2018-05-11 ENCOUNTER — Encounter (INDEPENDENT_AMBULATORY_CARE_PROVIDER_SITE_OTHER): Payer: Medicare Other | Admitting: Ophthalmology

## 2018-05-11 DIAGNOSIS — D3132 Benign neoplasm of left choroid: Secondary | ICD-10-CM

## 2018-05-11 DIAGNOSIS — H34831 Tributary (branch) retinal vein occlusion, right eye, with macular edema: Secondary | ICD-10-CM | POA: Diagnosis not present

## 2018-05-11 DIAGNOSIS — I10 Essential (primary) hypertension: Secondary | ICD-10-CM

## 2018-05-11 DIAGNOSIS — H2512 Age-related nuclear cataract, left eye: Secondary | ICD-10-CM | POA: Diagnosis not present

## 2018-05-11 DIAGNOSIS — H35033 Hypertensive retinopathy, bilateral: Secondary | ICD-10-CM

## 2018-05-11 DIAGNOSIS — H43813 Vitreous degeneration, bilateral: Secondary | ICD-10-CM

## 2018-06-15 ENCOUNTER — Other Ambulatory Visit: Payer: Self-pay

## 2018-06-15 ENCOUNTER — Encounter (INDEPENDENT_AMBULATORY_CARE_PROVIDER_SITE_OTHER): Payer: Medicare Other | Admitting: Ophthalmology

## 2018-06-15 DIAGNOSIS — H43813 Vitreous degeneration, bilateral: Secondary | ICD-10-CM

## 2018-06-15 DIAGNOSIS — H34831 Tributary (branch) retinal vein occlusion, right eye, with macular edema: Secondary | ICD-10-CM

## 2018-06-15 DIAGNOSIS — H35033 Hypertensive retinopathy, bilateral: Secondary | ICD-10-CM | POA: Diagnosis not present

## 2018-06-15 DIAGNOSIS — H2512 Age-related nuclear cataract, left eye: Secondary | ICD-10-CM

## 2018-06-15 DIAGNOSIS — D3132 Benign neoplasm of left choroid: Secondary | ICD-10-CM

## 2018-06-15 DIAGNOSIS — I10 Essential (primary) hypertension: Secondary | ICD-10-CM

## 2018-07-05 ENCOUNTER — Other Ambulatory Visit: Payer: Self-pay | Admitting: Internal Medicine

## 2018-07-05 DIAGNOSIS — E785 Hyperlipidemia, unspecified: Secondary | ICD-10-CM

## 2018-07-27 ENCOUNTER — Other Ambulatory Visit: Payer: Self-pay

## 2018-07-27 ENCOUNTER — Encounter (INDEPENDENT_AMBULATORY_CARE_PROVIDER_SITE_OTHER): Payer: Medicare Other | Admitting: Ophthalmology

## 2018-07-27 DIAGNOSIS — I10 Essential (primary) hypertension: Secondary | ICD-10-CM

## 2018-07-27 DIAGNOSIS — H35033 Hypertensive retinopathy, bilateral: Secondary | ICD-10-CM

## 2018-07-27 DIAGNOSIS — D3132 Benign neoplasm of left choroid: Secondary | ICD-10-CM | POA: Diagnosis not present

## 2018-07-27 DIAGNOSIS — H35371 Puckering of macula, right eye: Secondary | ICD-10-CM | POA: Diagnosis not present

## 2018-07-27 DIAGNOSIS — H2513 Age-related nuclear cataract, bilateral: Secondary | ICD-10-CM

## 2018-07-27 DIAGNOSIS — H43813 Vitreous degeneration, bilateral: Secondary | ICD-10-CM

## 2018-07-27 DIAGNOSIS — H34831 Tributary (branch) retinal vein occlusion, right eye, with macular edema: Secondary | ICD-10-CM | POA: Diagnosis not present

## 2018-07-28 ENCOUNTER — Other Ambulatory Visit: Payer: Self-pay | Admitting: Internal Medicine

## 2018-07-28 DIAGNOSIS — I10 Essential (primary) hypertension: Secondary | ICD-10-CM

## 2018-09-15 ENCOUNTER — Encounter (INDEPENDENT_AMBULATORY_CARE_PROVIDER_SITE_OTHER): Payer: Medicare Other | Admitting: Ophthalmology

## 2018-09-15 ENCOUNTER — Other Ambulatory Visit: Payer: Self-pay

## 2018-09-15 DIAGNOSIS — H35033 Hypertensive retinopathy, bilateral: Secondary | ICD-10-CM | POA: Diagnosis not present

## 2018-09-15 DIAGNOSIS — H35371 Puckering of macula, right eye: Secondary | ICD-10-CM | POA: Diagnosis not present

## 2018-09-15 DIAGNOSIS — H43813 Vitreous degeneration, bilateral: Secondary | ICD-10-CM

## 2018-09-15 DIAGNOSIS — I10 Essential (primary) hypertension: Secondary | ICD-10-CM | POA: Diagnosis not present

## 2018-09-15 DIAGNOSIS — D3132 Benign neoplasm of left choroid: Secondary | ICD-10-CM | POA: Diagnosis not present

## 2018-09-15 DIAGNOSIS — H34831 Tributary (branch) retinal vein occlusion, right eye, with macular edema: Secondary | ICD-10-CM | POA: Diagnosis not present

## 2018-09-28 DIAGNOSIS — H40022 Open angle with borderline findings, high risk, left eye: Secondary | ICD-10-CM | POA: Diagnosis not present

## 2018-09-28 DIAGNOSIS — H34831 Tributary (branch) retinal vein occlusion, right eye, with macular edema: Secondary | ICD-10-CM | POA: Diagnosis not present

## 2018-09-28 DIAGNOSIS — H401111 Primary open-angle glaucoma, right eye, mild stage: Secondary | ICD-10-CM | POA: Diagnosis not present

## 2018-09-28 DIAGNOSIS — Z961 Presence of intraocular lens: Secondary | ICD-10-CM | POA: Diagnosis not present

## 2018-10-22 ENCOUNTER — Other Ambulatory Visit: Payer: Self-pay | Admitting: Internal Medicine

## 2018-10-22 DIAGNOSIS — I10 Essential (primary) hypertension: Secondary | ICD-10-CM

## 2018-11-03 ENCOUNTER — Encounter (INDEPENDENT_AMBULATORY_CARE_PROVIDER_SITE_OTHER): Payer: Medicare Other | Admitting: Ophthalmology

## 2018-11-03 ENCOUNTER — Other Ambulatory Visit: Payer: Self-pay

## 2018-11-03 DIAGNOSIS — D3132 Benign neoplasm of left choroid: Secondary | ICD-10-CM | POA: Diagnosis not present

## 2018-11-03 DIAGNOSIS — H35033 Hypertensive retinopathy, bilateral: Secondary | ICD-10-CM

## 2018-11-03 DIAGNOSIS — H43813 Vitreous degeneration, bilateral: Secondary | ICD-10-CM | POA: Diagnosis not present

## 2018-11-03 DIAGNOSIS — H2512 Age-related nuclear cataract, left eye: Secondary | ICD-10-CM | POA: Diagnosis not present

## 2018-11-03 DIAGNOSIS — I10 Essential (primary) hypertension: Secondary | ICD-10-CM

## 2018-11-03 DIAGNOSIS — H35371 Puckering of macula, right eye: Secondary | ICD-10-CM | POA: Diagnosis not present

## 2018-11-03 DIAGNOSIS — H34831 Tributary (branch) retinal vein occlusion, right eye, with macular edema: Secondary | ICD-10-CM

## 2018-11-16 DIAGNOSIS — H401111 Primary open-angle glaucoma, right eye, mild stage: Secondary | ICD-10-CM | POA: Diagnosis not present

## 2018-11-16 DIAGNOSIS — H40022 Open angle with borderline findings, high risk, left eye: Secondary | ICD-10-CM | POA: Diagnosis not present

## 2018-12-22 ENCOUNTER — Other Ambulatory Visit: Payer: Self-pay

## 2018-12-22 ENCOUNTER — Encounter (INDEPENDENT_AMBULATORY_CARE_PROVIDER_SITE_OTHER): Payer: Medicare Other | Admitting: Ophthalmology

## 2018-12-22 DIAGNOSIS — H43813 Vitreous degeneration, bilateral: Secondary | ICD-10-CM

## 2018-12-22 DIAGNOSIS — H34831 Tributary (branch) retinal vein occlusion, right eye, with macular edema: Secondary | ICD-10-CM

## 2018-12-22 DIAGNOSIS — I10 Essential (primary) hypertension: Secondary | ICD-10-CM

## 2018-12-22 DIAGNOSIS — D3132 Benign neoplasm of left choroid: Secondary | ICD-10-CM | POA: Diagnosis not present

## 2018-12-22 DIAGNOSIS — H35033 Hypertensive retinopathy, bilateral: Secondary | ICD-10-CM | POA: Diagnosis not present

## 2018-12-29 ENCOUNTER — Other Ambulatory Visit: Payer: Self-pay | Admitting: Internal Medicine

## 2018-12-29 ENCOUNTER — Encounter: Payer: Self-pay | Admitting: Internal Medicine

## 2018-12-30 ENCOUNTER — Other Ambulatory Visit: Payer: Self-pay | Admitting: Internal Medicine

## 2018-12-30 DIAGNOSIS — E785 Hyperlipidemia, unspecified: Secondary | ICD-10-CM

## 2019-01-13 ENCOUNTER — Other Ambulatory Visit: Payer: Self-pay | Admitting: Internal Medicine

## 2019-01-13 DIAGNOSIS — I10 Essential (primary) hypertension: Secondary | ICD-10-CM

## 2019-02-09 ENCOUNTER — Other Ambulatory Visit: Payer: Self-pay

## 2019-02-09 ENCOUNTER — Encounter (INDEPENDENT_AMBULATORY_CARE_PROVIDER_SITE_OTHER): Payer: Medicare Other | Admitting: Ophthalmology

## 2019-02-09 DIAGNOSIS — H35033 Hypertensive retinopathy, bilateral: Secondary | ICD-10-CM

## 2019-02-09 DIAGNOSIS — H2512 Age-related nuclear cataract, left eye: Secondary | ICD-10-CM

## 2019-02-09 DIAGNOSIS — I10 Essential (primary) hypertension: Secondary | ICD-10-CM | POA: Diagnosis not present

## 2019-02-09 DIAGNOSIS — H34831 Tributary (branch) retinal vein occlusion, right eye, with macular edema: Secondary | ICD-10-CM | POA: Diagnosis not present

## 2019-02-09 DIAGNOSIS — D3132 Benign neoplasm of left choroid: Secondary | ICD-10-CM

## 2019-02-09 DIAGNOSIS — H43813 Vitreous degeneration, bilateral: Secondary | ICD-10-CM

## 2019-02-09 LAB — HM DIABETES EYE EXAM

## 2019-02-11 ENCOUNTER — Other Ambulatory Visit: Payer: Self-pay

## 2019-02-11 ENCOUNTER — Ambulatory Visit (INDEPENDENT_AMBULATORY_CARE_PROVIDER_SITE_OTHER): Payer: Medicare Other | Admitting: Internal Medicine

## 2019-02-11 ENCOUNTER — Encounter: Payer: Self-pay | Admitting: Internal Medicine

## 2019-02-11 VITALS — BP 160/80 | HR 72 | Temp 97.6°F | Ht 72.0 in | Wt 203.0 lb

## 2019-02-11 DIAGNOSIS — E785 Hyperlipidemia, unspecified: Secondary | ICD-10-CM | POA: Diagnosis not present

## 2019-02-11 DIAGNOSIS — I10 Essential (primary) hypertension: Secondary | ICD-10-CM

## 2019-02-11 DIAGNOSIS — Z0000 Encounter for general adult medical examination without abnormal findings: Secondary | ICD-10-CM | POA: Diagnosis not present

## 2019-02-11 DIAGNOSIS — R7303 Prediabetes: Secondary | ICD-10-CM

## 2019-02-11 DIAGNOSIS — R7989 Other specified abnormal findings of blood chemistry: Secondary | ICD-10-CM

## 2019-02-11 DIAGNOSIS — E039 Hypothyroidism, unspecified: Secondary | ICD-10-CM

## 2019-02-11 DIAGNOSIS — N400 Enlarged prostate without lower urinary tract symptoms: Secondary | ICD-10-CM | POA: Diagnosis not present

## 2019-02-11 MED ORDER — ATORVASTATIN CALCIUM 20 MG PO TABS: 20.0000 mg | ORAL_TABLET | Freq: Every day | ORAL | 1 refills | Status: DC

## 2019-02-11 MED ORDER — TELMISARTAN 80 MG PO TABS: 80.0000 mg | ORAL_TABLET | Freq: Every day | ORAL | 1 refills | Status: DC

## 2019-02-11 NOTE — Patient Instructions (Signed)

## 2019-02-11 NOTE — Progress Notes (Signed)
Subjective:  Patient ID: Antonio Acosta, male    DOB: 12-Oct-1950  Age: 69 y.o. MRN: [id number]  CC: Annual Exam, Hypertension, and Hyperlipidemia  This visit occurred during the SARS-CoV-2 public health emergency.  Safety protocols were in place, including screening questions prior to the visit, additional usage of staff PPE, and extensive cleaning of exam room while observing appropriate contact time as indicated for disinfecting solutions.    HPI Antonio Acosta presents for a CPX  He tells me his blood pressure has recently been well controlled.  According to prescription refills he would have run out telmisartan months ago.  He is active and denies any recent episodes of chest pain, shortness of breath, palpitations, edema, fatigue, headache, dizziness, or lightheadedness.  He wants to change rosuvastatin to atorvastatin because he thinks it will be less expensive.  Outpatient Medications Prior to Visit  Medication Sig Dispense Refill  . ALREX 0.2 % SUSP     . ARTIFICIAL TEAR SOLUTION OP Place 1 drop into both eyes daily.    Marland Kitchen aspirin 81 MG tablet Take 1 tablet (81 mg total) by mouth daily. 90 tablet 1  . Cholecalciferol (VITAMIN D PO) Take 1 capsule by mouth daily.    . dorzolamide-timolol (COSOPT) 22.3-6.8 MG/ML ophthalmic solution INSTILL 1 DROP INTO BOTH EYES TWICE A DAY    . metoprolol succinate (TOPROL-XL) 100 MG 24 hr tablet TAKE 1 TABLET (100 MG TOTAL) BY MOUTH DAILY. TAKE WITH OR IMMEDIATELY FOLLOWING A MEAL. 90 tablet 1  . Vitamins/Minerals TABS Take by mouth.    . rosuvastatin (CRESTOR) 5 MG tablet TAKE 1 TABLET BY MOUTH EVERY DAY 90 tablet 1  . telmisartan (MICARDIS) 80 MG tablet TAKE 1 TABLET BY MOUTH EVERY DAY 90 tablet 1  . brimonidine (ALPHAGAN) 0.2 % ophthalmic solution Place 1 drop into the right eye 2 (two) times daily. 5 mL 12  . dorzolamide (TRUSOPT) 2 % ophthalmic solution Place 1 drop into the right eye 3 (three) times daily. 10 mL 12  . gatifloxacin (ZYMAXID)  0.5 % SOLN Place 1 drop into the right eye 4 (four) times daily.    Marland Kitchen ketorolac (ACULAR) 0.5 % ophthalmic solution Place 1 drop into the right eye 2 (two) times daily.    . prednisoLONE acetate (PRED FORTE) 1 % ophthalmic suspension Place 1 drop into the right eye 4 (four) times daily. 5 mL 0   No facility-administered medications prior to visit.    ROS Review of Systems  Constitutional: Positive for unexpected weight change (wt gain). Negative for chills, diaphoresis and fatigue.  HENT: Negative.   Eyes: Negative.  Negative for visual disturbance.  Respiratory: Negative for apnea, cough, chest tightness, shortness of breath and wheezing.   Cardiovascular: Negative for chest pain, palpitations and leg swelling.  Gastrointestinal: Negative for abdominal pain, constipation, diarrhea, nausea and vomiting.  Endocrine: Negative for cold intolerance and heat intolerance.  Genitourinary: Negative.  Negative for difficulty urinating and scrotal swelling.  Musculoskeletal: Negative.  Negative for arthralgias, myalgias and neck pain.  Skin: Negative.  Negative for color change and pallor.  Neurological: Negative.  Negative for dizziness, weakness and light-headedness.  Hematological: Negative for adenopathy. Does not bruise/bleed easily.  Psychiatric/Behavioral: Negative.     Objective:  BP (!) 160/80 (BP Location: Left Arm, Patient Position: Sitting, Cuff Size: Large)   Pulse 72   Temp 97.6 F (36.4 C) (Oral)   Ht 6' (1.829 m)   Wt 203 lb (92.1 kg)   SpO2 90%  BMI 27.53 kg/m   BP Readings from Last 3 Encounters:  02/11/19 (!) 160/80  10/15/17 103/72  08/04/17 (!) 146/82    Wt Readings from Last 3 Encounters:  02/11/19 203 lb (92.1 kg)  10/14/17 201 lb (91.2 kg)  08/04/17 201 lb (91.2 kg)    Physical Exam Vitals reviewed.  Constitutional:      Appearance: Normal appearance.  HENT:     Nose: Nose normal.     Mouth/Throat:     Mouth: Mucous membranes are moist.  Eyes:      General: No scleral icterus.    Conjunctiva/sclera: Conjunctivae normal.  Cardiovascular:     Rate and Rhythm: Normal rate and regular rhythm.     Heart sounds: No murmur.  Pulmonary:     Effort: Pulmonary effort is normal.     Breath sounds: No stridor. No wheezing, rhonchi or rales.  Abdominal:     General: Abdomen is flat. Bowel sounds are normal. There is no distension.     Palpations: Abdomen is soft. There is no hepatomegaly, splenomegaly or mass.     Tenderness: There is no abdominal tenderness.     Hernia: No hernia is present. There is no hernia in the left inguinal area or right inguinal area.  Genitourinary:    Pubic Area: No rash.      Penis: Normal and circumcised. No discharge, swelling or lesions.      Testes: Normal.        Right: Mass not present.        Left: Mass not present.     Epididymis:     Right: Normal.     Left: Normal.     Prostate: Enlarged (1+ smooth symm BPH). Not tender and no nodules present.     Rectum: Normal. Guaiac result negative. No mass, tenderness, anal fissure, external hemorrhoid or internal hemorrhoid. Normal anal tone.  Musculoskeletal:        General: Normal range of motion.     Cervical back: Neck supple.     Right lower leg: No edema.     Left lower leg: No edema.  Lymphadenopathy:     Cervical: No cervical adenopathy.     Lower Body: No right inguinal adenopathy. No left inguinal adenopathy.  Skin:    General: Skin is warm and dry.  Neurological:     General: No focal deficit present.     Mental Status: He is alert.  Psychiatric:        Mood and Affect: Mood normal.        Behavior: Behavior normal.     Lab Results  Component Value Date   WBC 8.6 02/11/2019   HGB 13.1 02/11/2019   HCT 39.8 02/11/2019   PLT 261.0 02/11/2019   GLUCOSE 100 (H) 02/11/2019   CHOL 192 02/11/2019   TRIG 69.0 02/11/2019   HDL 60.30 02/11/2019   LDLDIRECT 137.6 09/11/2012   LDLCALC 118 (H) 02/11/2019   ALT 26 02/11/2019   AST 26  02/11/2019   NA 136 02/11/2019   K 4.0 02/11/2019   CL 100 02/11/2019   CREATININE 1.03 02/11/2019   BUN 20 02/11/2019   CO2 26 02/11/2019   TSH 17.19 (H) 02/11/2019   PSA 1.53 02/11/2019   INR 0.94 12/28/2011   HGBA1C 6.2 02/11/2019    No results found.  Assessment & Plan:   Antonio Acosta was seen today for annual exam, hypertension and hyperlipidemia.  Diagnoses and all orders for this visit:  Essential hypertension,  benign- His blood pressure is not adequately well controlled.  I suspect there is some degree of noncompliance.  I have asked him to restart telmisartan.  His labs are negative for secondary causes or endorgan damage. -     CBC with Differential/Platelet; Future -     Basic metabolic panel; Future -     Urinalysis, Routine w reflex microscopic; Future -     telmisartan (MICARDIS) 80 MG tablet; Take 1 tablet (80 mg total) by mouth daily. -     Urinalysis, Routine w reflex microscopic -     Basic metabolic panel -     CBC with Differential/Platelet  Prediabetes- His A1c is at 6.2%.  Medical therapy is not indicated.  He was encouraged to improve his lifestyle modifications. -     Hemoglobin A1c; Future -     Basic metabolic panel; Future -     Basic metabolic panel -     Hemoglobin A1c  TSH elevation- He has a positive TPO ab and his TSH is up to 17.19.  He has developed overt hypothyroidism. -     Thyroid Panel With TSH; Future -     Thyroid peroxidase antibody; Future -     Thyroid peroxidase antibody -     Thyroid Panel With TSH  Hyperlipidemia with target LDL less than 130- Will change statins at his request. -     Lipid panel; Future -     Hepatic function panel; Future -     atorvastatin (LIPITOR) 20 MG tablet; Take 1 tablet (20 mg total) by mouth daily. -     Hepatic function panel -     Lipid panel  Routine general medical examination at a health care facility- Exam completed, labs reviewed, he refused to view flu vaccine, colon cancer screening is  up-to-date, patient education was given.  Benign prostatic hyperplasia without lower urinary tract symptoms- His PSA is normal which is a reassuring sign that he does not have prostate cancer.  He has no symptoms that need to be treated. -     PSA; Future -     PSA  Acquired hypothyroidism -     levothyroxine (SYNTHROID) 50 MCG tablet; Take 1 tablet (50 mcg total) by mouth daily before breakfast.   I have discontinued Norvel Coghlan's ketorolac, brimonidine, dorzolamide, gatifloxacin, prednisoLONE acetate, and rosuvastatin. I have also changed his telmisartan. Additionally, I am having him start on atorvastatin and levothyroxine. Lastly, I am having him maintain his aspirin, Cholecalciferol (VITAMIN D PO), ARTIFICIAL TEAR SOLUTION OP, metoprolol succinate, dorzolamide-timolol, Alrex, and Vitamins/Minerals.  Meds ordered this encounter  Medications  . telmisartan (MICARDIS) 80 MG tablet    Sig: Take 1 tablet (80 mg total) by mouth daily.    Dispense:  90 tablet    Refill:  1  . atorvastatin (LIPITOR) 20 MG tablet    Sig: Take 1 tablet (20 mg total) by mouth daily.    Dispense:  90 tablet    Refill:  1  . levothyroxine (SYNTHROID) 50 MCG tablet    Sig: Take 1 tablet (50 mcg total) by mouth daily before breakfast.    Dispense:  90 tablet    Refill:  0     Follow-up: Return in about 3 months (around 05/11/2019).  Scarlette Calico, MD

## 2019-02-12 ENCOUNTER — Encounter: Payer: Self-pay | Admitting: Internal Medicine

## 2019-02-12 DIAGNOSIS — E039 Hypothyroidism, unspecified: Secondary | ICD-10-CM | POA: Insufficient documentation

## 2019-02-12 DIAGNOSIS — N400 Enlarged prostate without lower urinary tract symptoms: Secondary | ICD-10-CM | POA: Insufficient documentation

## 2019-02-12 LAB — THYROID PANEL WITH TSH
Free Thyroxine Index: 2.1 (ref 1.4–3.8)
T3 Uptake: 29 % (ref 22–35)
T4, Total: 7.3 ug/dL (ref 4.9–10.5)
TSH: 17.19 mIU/L — ABNORMAL HIGH (ref 0.40–4.50)

## 2019-02-12 LAB — LIPID PANEL
Cholesterol: 192 mg/dL (ref 0–200)
HDL: 60.3 mg/dL (ref >39.00)
LDL Cholesterol: 118 mg/dL — ABNORMAL HIGH (ref 0–99)
NonHDL: 132.19
Total CHOL/HDL Ratio: 3
Triglycerides: 69 mg/dL (ref 0.0–149.0)
VLDL: 13.8 mg/dL (ref 0.0–40.0)

## 2019-02-12 LAB — CBC WITH DIFFERENTIAL/PLATELET
Basophils Absolute: 0.1 10*3/uL (ref 0.0–0.1)
Basophils Relative: 1.3 % (ref 0.0–3.0)
Eosinophils Absolute: 0.2 10*3/uL (ref 0.0–0.7)
Eosinophils Relative: 1.8 % (ref 0.0–5.0)
HCT: 39.8 % (ref 39.0–52.0)
Hemoglobin: 13.1 g/dL (ref 13.0–17.0)
Lymphocytes Relative: 31.8 % (ref 12.0–46.0)
Lymphs Abs: 2.7 10*3/uL (ref 0.7–4.0)
MCHC: 33 g/dL (ref 30.0–36.0)
MCV: 84.8 fl (ref 78.0–100.0)
Monocytes Absolute: 0.5 10*3/uL (ref 0.1–1.0)
Monocytes Relative: 5.5 % (ref 3.0–12.0)
Neutro Abs: 5.1 10*3/uL (ref 1.4–7.7)
Neutrophils Relative %: 59.6 % (ref 43.0–77.0)
Platelets: 261 10*3/uL (ref 150.0–400.0)
RBC: 4.69 Mil/uL (ref 4.22–5.81)
RDW: 13.5 % (ref 11.5–15.5)
WBC: 8.6 10*3/uL (ref 4.0–10.5)

## 2019-02-12 LAB — THYROID PEROXIDASE ANTIBODY: Thyroperoxidase Ab SerPl-aCnc: 121 IU/mL — ABNORMAL HIGH (ref <9)

## 2019-02-12 LAB — URINALYSIS, ROUTINE W REFLEX MICROSCOPIC
Bilirubin Urine: NEGATIVE
Hgb urine dipstick: NEGATIVE
Ketones, ur: NEGATIVE
Leukocytes,Ua: NEGATIVE
Nitrite: NEGATIVE
RBC / HPF: NONE SEEN (ref 0–?)
Specific Gravity, Urine: 1.015 (ref 1.000–1.030)
Total Protein, Urine: NEGATIVE
Urine Glucose: NEGATIVE
Urobilinogen, UA: 0.2 (ref 0.0–1.0)
pH: 6 (ref 5.0–8.0)

## 2019-02-12 LAB — BASIC METABOLIC PANEL
BUN: 20 mg/dL (ref 6–23)
CO2: 26 mEq/L (ref 19–32)
Calcium: 9.7 mg/dL (ref 8.4–10.5)
Chloride: 100 mEq/L (ref 96–112)
Creatinine, Ser: 1.03 mg/dL (ref 0.40–1.50)
GFR: 72.14 mL/min (ref >60.00)
Glucose, Bld: 100 mg/dL — ABNORMAL HIGH (ref 70–99)
Potassium: 4 mEq/L (ref 3.5–5.1)
Sodium: 136 mEq/L (ref 135–145)

## 2019-02-12 LAB — HEPATIC FUNCTION PANEL
ALT: 26 U/L (ref 0–53)
AST: 26 U/L (ref 0–37)
Albumin: 4.9 g/dL (ref 3.5–5.2)
Alkaline Phosphatase: 77 U/L (ref 39–117)
Bilirubin, Direct: 0.1 mg/dL (ref 0.0–0.3)
Total Bilirubin: 0.8 mg/dL (ref 0.2–1.2)
Total Protein: 7.6 g/dL (ref 6.0–8.3)

## 2019-02-12 LAB — PSA: PSA: 1.53 ng/mL (ref 0.10–4.00)

## 2019-02-12 LAB — HEMOGLOBIN A1C: Hgb A1c MFr Bld: 6.2 % (ref 4.6–6.5)

## 2019-02-12 MED ORDER — LEVOTHYROXINE SODIUM 50 MCG PO TABS: 50.0000 ug | ORAL_TABLET | Freq: Every day | ORAL | 0 refills | Status: DC

## 2019-02-19 DIAGNOSIS — Z23 Encounter for immunization: Secondary | ICD-10-CM | POA: Diagnosis not present

## 2019-03-19 DIAGNOSIS — Z23 Encounter for immunization: Secondary | ICD-10-CM | POA: Diagnosis not present

## 2019-03-25 ENCOUNTER — Encounter (INDEPENDENT_AMBULATORY_CARE_PROVIDER_SITE_OTHER): Payer: Medicare Other | Admitting: Ophthalmology

## 2019-03-25 DIAGNOSIS — I10 Essential (primary) hypertension: Secondary | ICD-10-CM

## 2019-03-25 DIAGNOSIS — H34831 Tributary (branch) retinal vein occlusion, right eye, with macular edema: Secondary | ICD-10-CM | POA: Diagnosis not present

## 2019-03-25 DIAGNOSIS — H43813 Vitreous degeneration, bilateral: Secondary | ICD-10-CM

## 2019-03-25 DIAGNOSIS — H35033 Hypertensive retinopathy, bilateral: Secondary | ICD-10-CM | POA: Diagnosis not present

## 2019-03-25 DIAGNOSIS — D3132 Benign neoplasm of left choroid: Secondary | ICD-10-CM | POA: Diagnosis not present

## 2019-03-25 DIAGNOSIS — H2512 Age-related nuclear cataract, left eye: Secondary | ICD-10-CM | POA: Diagnosis not present

## 2019-04-02 DIAGNOSIS — H40051 Ocular hypertension, right eye: Secondary | ICD-10-CM | POA: Diagnosis not present

## 2019-04-02 DIAGNOSIS — H401111 Primary open-angle glaucoma, right eye, mild stage: Secondary | ICD-10-CM | POA: Diagnosis not present

## 2019-04-02 DIAGNOSIS — Z961 Presence of intraocular lens: Secondary | ICD-10-CM | POA: Diagnosis not present

## 2019-04-02 DIAGNOSIS — H40022 Open angle with borderline findings, high risk, left eye: Secondary | ICD-10-CM | POA: Diagnosis not present

## 2019-04-15 ENCOUNTER — Other Ambulatory Visit: Payer: Self-pay | Admitting: Internal Medicine

## 2019-04-15 DIAGNOSIS — I10 Essential (primary) hypertension: Secondary | ICD-10-CM

## 2019-05-13 ENCOUNTER — Encounter: Payer: Self-pay | Admitting: Internal Medicine

## 2019-05-13 DIAGNOSIS — E039 Hypothyroidism, unspecified: Secondary | ICD-10-CM

## 2019-05-13 MED ORDER — LEVOTHYROXINE SODIUM 50 MCG PO TABS: 50.0000 ug | ORAL_TABLET | Freq: Every day | ORAL | 0 refills | Status: DC

## 2019-05-20 ENCOUNTER — Encounter (INDEPENDENT_AMBULATORY_CARE_PROVIDER_SITE_OTHER): Payer: Medicare Other | Admitting: Ophthalmology

## 2019-05-20 ENCOUNTER — Other Ambulatory Visit: Payer: Self-pay

## 2019-05-20 DIAGNOSIS — H35033 Hypertensive retinopathy, bilateral: Secondary | ICD-10-CM

## 2019-05-20 DIAGNOSIS — D3132 Benign neoplasm of left choroid: Secondary | ICD-10-CM

## 2019-05-20 DIAGNOSIS — I10 Essential (primary) hypertension: Secondary | ICD-10-CM

## 2019-05-20 DIAGNOSIS — H34831 Tributary (branch) retinal vein occlusion, right eye, with macular edema: Secondary | ICD-10-CM | POA: Diagnosis not present

## 2019-05-20 DIAGNOSIS — H43813 Vitreous degeneration, bilateral: Secondary | ICD-10-CM | POA: Diagnosis not present

## 2019-05-26 ENCOUNTER — Encounter: Payer: Self-pay | Admitting: Internal Medicine

## 2019-05-26 ENCOUNTER — Ambulatory Visit (INDEPENDENT_AMBULATORY_CARE_PROVIDER_SITE_OTHER): Payer: Medicare Other | Admitting: Internal Medicine

## 2019-05-26 ENCOUNTER — Other Ambulatory Visit: Payer: Self-pay

## 2019-05-26 VITALS — BP 154/82 | HR 58 | Temp 98.1°F | Resp 16 | Ht 72.0 in | Wt 201.0 lb

## 2019-05-26 DIAGNOSIS — Z23 Encounter for immunization: Secondary | ICD-10-CM | POA: Diagnosis not present

## 2019-05-26 DIAGNOSIS — K635 Polyp of colon: Secondary | ICD-10-CM

## 2019-05-26 DIAGNOSIS — I10 Essential (primary) hypertension: Secondary | ICD-10-CM | POA: Diagnosis not present

## 2019-05-26 DIAGNOSIS — E039 Hypothyroidism, unspecified: Secondary | ICD-10-CM | POA: Diagnosis not present

## 2019-05-26 LAB — BASIC METABOLIC PANEL
BUN: 20 mg/dL (ref 6–23)
CO2: 26 mEq/L (ref 19–32)
Calcium: 9.6 mg/dL (ref 8.4–10.5)
Chloride: 100 mEq/L (ref 96–112)
Creatinine, Ser: 1.01 mg/dL (ref 0.40–1.50)
GFR: 73.73 mL/min (ref >60.00)
Glucose, Bld: 98 mg/dL (ref 70–99)
Potassium: 4.1 mEq/L (ref 3.5–5.1)
Sodium: 134 mEq/L — ABNORMAL LOW (ref 135–145)

## 2019-05-26 LAB — TSH: TSH: 3.14 u[IU]/mL (ref 0.35–4.50)

## 2019-05-26 NOTE — Patient Instructions (Signed)

## 2019-05-26 NOTE — Progress Notes (Signed)
Subjective:  Patient ID: Antonio Acosta, male    DOB: 18-Dec-1950  Age: 69 y.o. MRN: [id number]  CC: Hypertension and Hypothyroidism  This visit occurred during the SARS-CoV-2 public health emergency.  Safety protocols were in place, including screening questions prior to the visit, additional usage of staff PPE, and extensive cleaning of exam room while observing appropriate contact time as indicated for disinfecting solutions.    HPI Antonio Acosta presents for f/up - He feels well and offers no complaints.  He thinks his blood pressure has been well controlled.  He is active and denies any recent episodes of chest pain, shortness of breath, palpitations, edema, or fatigue.  Outpatient Medications Prior to Visit  Medication Sig Dispense Refill  . ALREX 0.2 % SUSP     . aspirin 81 MG tablet Take 1 tablet (81 mg total) by mouth daily. 90 tablet 1  . atorvastatin (LIPITOR) 20 MG tablet Take 1 tablet (20 mg total) by mouth daily. 90 tablet 1  . brimonidine (ALPHAGAN) 0.2 % ophthalmic solution     . Cholecalciferol (VITAMIN D PO) Take 1 capsule by mouth daily.    . dorzolamide-timolol (COSOPT) 22.3-6.8 MG/ML ophthalmic solution INSTILL 1 DROP INTO BOTH EYES TWICE A DAY    . levothyroxine (SYNTHROID) 50 MCG tablet Take 1 tablet (50 mcg total) by mouth daily before breakfast. 90 tablet 0  . metoprolol succinate (TOPROL-XL) 100 MG 24 hr tablet TAKE 1 TABLET BY MOUTH DAILY. TAKE WITH OR IMMEDIATELY FOLLOWING A MEAL. 90 tablet 1  . telmisartan (MICARDIS) 80 MG tablet Take 1 tablet (80 mg total) by mouth daily. 90 tablet 1  . Vitamins/Minerals TABS Take by mouth.    . ARTIFICIAL TEAR SOLUTION OP Place 1 drop into both eyes daily.     No facility-administered medications prior to visit.    ROS Review of Systems  Constitutional: Negative for appetite change, diaphoresis, fatigue and unexpected weight change.  HENT: Negative.   Eyes: Negative for visual disturbance.  Respiratory: Negative for  cough, chest tightness, shortness of breath and wheezing.   Cardiovascular: Negative for chest pain, palpitations and leg swelling.  Gastrointestinal: Negative for abdominal pain, constipation, diarrhea, nausea and vomiting.  Endocrine: Negative for cold intolerance and heat intolerance.  Genitourinary: Negative.  Negative for difficulty urinating.  Musculoskeletal: Negative.  Negative for arthralgias and myalgias.  Skin: Negative.  Negative for color change and pallor.  Neurological: Negative for dizziness, weakness and headaches.  Hematological: Negative for adenopathy. Does not bruise/bleed easily.  Psychiatric/Behavioral: Negative.     Objective:  BP (!) 154/82 (BP Location: Left Arm, Patient Position: Sitting, Cuff Size: Large)   Pulse (!) 58   Temp 98.1 F (36.7 C) (Oral)   Resp 16   Ht 6' (1.829 m)   Wt 201 lb (91.2 kg)   SpO2 99%   BMI 27.26 kg/m   BP Readings from Last 3 Encounters:  05/26/19 (!) 154/82  02/11/19 (!) 160/80  10/15/17 103/72    Wt Readings from Last 3 Encounters:  05/26/19 201 lb (91.2 kg)  02/11/19 203 lb (92.1 kg)  10/14/17 201 lb (91.2 kg)    Physical Exam Vitals reviewed.  Constitutional:      Appearance: Normal appearance.  HENT:     Nose: Nose normal.     Mouth/Throat:     Mouth: Mucous membranes are moist.  Eyes:     General: No scleral icterus.    Conjunctiva/sclera: Conjunctivae normal.  Cardiovascular:     Rate  and Rhythm: Normal rate and regular rhythm.     Heart sounds: No murmur.  Pulmonary:     Effort: Pulmonary effort is normal.     Breath sounds: No stridor. No wheezing, rhonchi or rales.  Abdominal:     General: Abdomen is flat. Bowel sounds are normal.     Palpations: There is no hepatomegaly, splenomegaly or mass.     Tenderness: There is no abdominal tenderness.  Musculoskeletal:        General: Normal range of motion.     Cervical back: Neck supple.     Right lower leg: No edema.     Left lower leg: No edema.    Lymphadenopathy:     Cervical: No cervical adenopathy.  Skin:    General: Skin is warm and dry.     Coloration: Skin is not pale.  Neurological:     General: No focal deficit present.     Mental Status: He is alert.  Psychiatric:        Mood and Affect: Mood normal.        Behavior: Behavior normal.     Lab Results  Component Value Date   WBC 8.6 02/11/2019   HGB 13.1 02/11/2019   HCT 39.8 02/11/2019   PLT 261.0 02/11/2019   GLUCOSE 98 05/26/2019   CHOL 192 02/11/2019   TRIG 69.0 02/11/2019   HDL 60.30 02/11/2019   LDLDIRECT 137.6 09/11/2012   LDLCALC 118 (H) 02/11/2019   ALT 26 02/11/2019   AST 26 02/11/2019   NA 134 (L) 05/26/2019   K 4.1 05/26/2019   CL 100 05/26/2019   CREATININE 1.01 05/26/2019   BUN 20 05/26/2019   CO2 26 05/26/2019   TSH 3.14 05/26/2019   PSA 1.53 02/11/2019   INR 0.94 12/28/2011   HGBA1C 6.2 02/11/2019    No results found.  Assessment & Plan:   Antonio Acosta was seen today for hypertension and hypothyroidism.  Diagnoses and all orders for this visit:  Acquired hypothyroidism- His TSH is in the normal range.  I recommended that he stay on the current dose of levothyroxine. -     TSH; Future -     TSH  Essential hypertension, benign- His blood pressure is not quite adequately well controlled.  He is not anxious to add another agent.  He has mild hyponatremia and renal function is normal.  Will continue the current regimen of a beta-blocker and ARB and he will continue working on his lifestyle modifications. -     Basic metabolic panel; Future -     Basic metabolic panel  Polyp of colon, unspecified part of colon, unspecified type -     Ambulatory referral to Gastroenterology  Need for pneumococcal vaccination -     Pneumococcal conjugate vaccine 13-valent   I have discontinued Antonio Acosta's ARTIFICIAL TEAR SOLUTION OP. I am also having him maintain his aspirin, Cholecalciferol (VITAMIN D PO), dorzolamide-timolol, Alrex,  Vitamins/Minerals, telmisartan, atorvastatin, metoprolol succinate, levothyroxine, and brimonidine.  No orders of the defined types were placed in this encounter.    Follow-up: Return in about 6 months (around 11/26/2019).  Antonio Calico, MD

## 2019-05-31 DIAGNOSIS — H40022 Open angle with borderline findings, high risk, left eye: Secondary | ICD-10-CM | POA: Diagnosis not present

## 2019-05-31 DIAGNOSIS — Z961 Presence of intraocular lens: Secondary | ICD-10-CM | POA: Diagnosis not present

## 2019-05-31 DIAGNOSIS — H401111 Primary open-angle glaucoma, right eye, mild stage: Secondary | ICD-10-CM | POA: Diagnosis not present

## 2019-05-31 DIAGNOSIS — H40051 Ocular hypertension, right eye: Secondary | ICD-10-CM | POA: Diagnosis not present

## 2019-06-15 ENCOUNTER — Encounter: Payer: Self-pay | Admitting: Internal Medicine

## 2019-07-22 ENCOUNTER — Other Ambulatory Visit: Payer: Self-pay

## 2019-07-22 ENCOUNTER — Encounter (INDEPENDENT_AMBULATORY_CARE_PROVIDER_SITE_OTHER): Payer: Medicare Other | Admitting: Ophthalmology

## 2019-07-22 DIAGNOSIS — D3132 Benign neoplasm of left choroid: Secondary | ICD-10-CM

## 2019-07-22 DIAGNOSIS — H35033 Hypertensive retinopathy, bilateral: Secondary | ICD-10-CM | POA: Diagnosis not present

## 2019-07-22 DIAGNOSIS — H35371 Puckering of macula, right eye: Secondary | ICD-10-CM | POA: Diagnosis not present

## 2019-07-22 DIAGNOSIS — I10 Essential (primary) hypertension: Secondary | ICD-10-CM | POA: Diagnosis not present

## 2019-07-22 DIAGNOSIS — H43813 Vitreous degeneration, bilateral: Secondary | ICD-10-CM | POA: Diagnosis not present

## 2019-07-22 DIAGNOSIS — H34831 Tributary (branch) retinal vein occlusion, right eye, with macular edema: Secondary | ICD-10-CM

## 2019-07-27 ENCOUNTER — Other Ambulatory Visit: Payer: Self-pay

## 2019-07-27 ENCOUNTER — Ambulatory Visit (AMBULATORY_SURGERY_CENTER): Payer: Self-pay | Admitting: *Deleted

## 2019-07-27 ENCOUNTER — Other Ambulatory Visit: Payer: Self-pay | Admitting: Internal Medicine

## 2019-07-27 VITALS — Ht 72.0 in | Wt 206.8 lb

## 2019-07-27 DIAGNOSIS — Z86010 Personal history of colonic polyps: Secondary | ICD-10-CM

## 2019-07-27 DIAGNOSIS — E039 Hypothyroidism, unspecified: Secondary | ICD-10-CM

## 2019-07-27 MED ORDER — SUTAB 1479-225-188 MG PO TABS: 1.0000 | ORAL_TABLET | ORAL | 0 refills | Status: DC

## 2019-07-27 NOTE — Progress Notes (Signed)
Patient denies any allergies to egg or soy products. Patient denies complications with anesthesia/sedation.  Patient denies oxygen use at home and denies diet medications. Emmi instructions for colonoscopy explained but patient denied information. Patient had both covid vaccinations, last one on 03/19/19.

## 2019-08-03 DIAGNOSIS — H401111 Primary open-angle glaucoma, right eye, mild stage: Secondary | ICD-10-CM | POA: Diagnosis not present

## 2019-08-03 DIAGNOSIS — H40022 Open angle with borderline findings, high risk, left eye: Secondary | ICD-10-CM | POA: Diagnosis not present

## 2019-08-03 DIAGNOSIS — H40051 Ocular hypertension, right eye: Secondary | ICD-10-CM | POA: Diagnosis not present

## 2019-08-04 ENCOUNTER — Other Ambulatory Visit: Payer: Self-pay | Admitting: Internal Medicine

## 2019-08-04 DIAGNOSIS — I10 Essential (primary) hypertension: Secondary | ICD-10-CM

## 2019-08-04 DIAGNOSIS — E785 Hyperlipidemia, unspecified: Secondary | ICD-10-CM

## 2019-08-10 ENCOUNTER — Encounter: Payer: Self-pay | Admitting: Internal Medicine

## 2019-08-10 ENCOUNTER — Other Ambulatory Visit: Payer: Self-pay

## 2019-08-10 ENCOUNTER — Ambulatory Visit (AMBULATORY_SURGERY_CENTER): Payer: Medicare Other | Admitting: Internal Medicine

## 2019-08-10 VITALS — BP 130/79 | HR 60 | Temp 97.2°F | Resp 14 | Ht 72.0 in | Wt 206.0 lb

## 2019-08-10 DIAGNOSIS — D123 Benign neoplasm of transverse colon: Secondary | ICD-10-CM | POA: Diagnosis not present

## 2019-08-10 DIAGNOSIS — D125 Benign neoplasm of sigmoid colon: Secondary | ICD-10-CM

## 2019-08-10 DIAGNOSIS — K6389 Other specified diseases of intestine: Secondary | ICD-10-CM | POA: Diagnosis not present

## 2019-08-10 DIAGNOSIS — Z86010 Personal history of colonic polyps: Secondary | ICD-10-CM

## 2019-08-10 DIAGNOSIS — K5730 Diverticulosis of large intestine without perforation or abscess without bleeding: Secondary | ICD-10-CM

## 2019-08-10 LAB — HM COLONOSCOPY

## 2019-08-10 MED ORDER — SODIUM CHLORIDE 0.9 % IV SOLN: 500.0000 mL | Freq: Once | INTRAVENOUS | Status: DC

## 2019-08-10 NOTE — Progress Notes (Signed)
Vital signs checked by:CW  The patient states no changes in medical or surgical history since pre-visit screening on 07/27/2019.

## 2019-08-10 NOTE — Patient Instructions (Signed)
Handouts provided on polyps, hemorrhoids and diverticulosis.   YOU HAD AN ENDOSCOPIC PROCEDURE TODAY AT [facility]:   Refer to the procedure report that was given to you for any specific questions about what was found during the examination.  If the procedure report does not answer your questions, please call your gastroenterologist to clarify.  If you requested that your care partner not be given the details of your procedure findings, then the procedure report has been included in a sealed envelope for you to review at your convenience later.  YOU SHOULD EXPECT: Some feelings of bloating in the abdomen. Passage of more gas than usual.  Walking can help get rid of the air that was put into your GI tract during the procedure and reduce the bloating. If you had a lower endoscopy (such as a colonoscopy or flexible sigmoidoscopy) you may notice spotting of blood in your stool or on the toilet paper. If you underwent a bowel prep for your procedure, you may not have a normal bowel movement for a few days.  Please Note:  You might notice some irritation and congestion in your nose or some drainage.  This is from the oxygen used during your procedure.  There is no need for concern and it should clear up in a day or so.  SYMPTOMS TO REPORT IMMEDIATELY:   Following lower endoscopy (colonoscopy or flexible sigmoidoscopy):  Excessive amounts of blood in the stool  Significant tenderness or worsening of abdominal pains  Swelling of the abdomen that is new, acute  Fever of 100F or higher  For urgent or emergent issues, a gastroenterologist can be reached at any hour by calling [phone]. Do not use MyChart messaging for urgent concerns.    DIET:  We do recommend a small meal at first, but then you may proceed to your regular diet.  Drink plenty of fluids but you should avoid alcoholic beverages for 24 hours.  ACTIVITY:  You should plan to take it easy for the rest of today and you  should NOT DRIVE or use heavy machinery until tomorrow (because of the sedation medicines used during the test).    FOLLOW UP: Our staff will call the number listed on your records 48-72 hours following your procedure to check on you and address any questions or concerns that you may have regarding the information given to you following your procedure. If we do not reach you, we will leave a message.  We will attempt to reach you two times.  During this call, we will ask if you have developed any symptoms of COVID 19. If you develop any symptoms (ie: fever, flu-like symptoms, shortness of breath, cough etc.) before then, please call [phone].  If you test positive for Covid 19 in the 2 weeks post procedure, please call and report this information to [place].    If any biopsies were taken you will be contacted by phone or by letter within the next 1-3 weeks.  Please call us at [phone] if you have not heard about the biopsies in 3 weeks.    SIGNATURES/CONFIDENTIALITY: You and/or your care partner have signed paperwork which will be entered into your electronic medical record.  These signatures attest to the fact that that the information above on your After Visit Summary has been reviewed and is understood.  Full responsibility of the confidentiality of this discharge information lies with you and/or your care-partner.

## 2019-08-10 NOTE — Op Note (Signed)
[facility] Patient Name: Antonio Acosta Procedure Date: 08/10/2019 8:40 AM MRN: [id number] Endoscopist: Docia Chuck. Henrene Pastor , MD Age: 69 Referring MD:  Date of Birth: [birth year] Gender: Male Account #: [id number] Procedure:                Colonoscopy with cold snare polypectomy x 1; with                            biopsies. Indications:              High risk colon cancer surveillance: Personal                            history of non-advanced adenoma, High risk colon                            cancer surveillance: Personal history of sessile                            serrated colon polyp (less than 10 mm in size) with                            no dysplasia. Previous examination December 2014                            with 4 polyps. Medicines:                Monitored Anesthesia Care Procedure:                Pre-Anesthesia Assessment:                           - Prior to the procedure, a History and Physical                            was performed, and patient medications and                            allergies were reviewed. The patient's tolerance of                            previous anesthesia was also reviewed. The risks                            and benefits of the procedure and the sedation                            options and risks were discussed with the patient.                            All questions were answered, and informed consent                            was obtained. Prior Anticoagulants: The patient has  taken no previous anticoagulant or antiplatelet                            agents. ASA Grade Assessment: II - A patient with                            mild systemic disease. After reviewing the risks                            and benefits, the patient was deemed in                            satisfactory condition to undergo the procedure.                           After obtaining informed consent, the colonoscope                             was passed under direct vision. Throughout the                            procedure, the patient's blood pressure, pulse, and                            oxygen saturations were monitored continuously. The                            Colonoscope was introduced through the anus and                            advanced to the the cecum, identified by                            appendiceal orifice and ileocecal valve. The                            ileocecal valve, appendiceal orifice, and rectum                            were photographed. The quality of the bowel                            preparation was excellent. The colonoscopy was                            performed without difficulty. The patient tolerated                            the procedure well. The bowel preparation used was                            SUPREP via split dose instruction. Scope In: 8:52:19 AM Scope Out: 9:06:35 AM Scope Withdrawal Time: 0 hours 11 minutes 17 seconds  Total Procedure Duration: 0  hours 14 minutes 16 seconds  Findings:                 A less than 1 mm polyp was found in the transverse                            colon. Biopsy removal was performed with a cold                            forceps and the specimen was sent for histology.                           A 5 mm polyp was found in the sigmoid colon. The                            polyp was removed with a cold snare. Resection and                            retrieval were complete.                           Multiple small and large-mouthed diverticula were                            found in the left colon.                           Internal hemorrhoids were found during                            retroflexion. The hemorrhoids were small.                           The exam was otherwise without abnormality on                            direct and retroflexion views. Complications:            No immediate complications.  Estimated blood loss:                            None. Estimated Blood Loss:     Estimated blood loss: none. Impression:               - One less than 1 mm polyp in the transverse colon.                            Removed with cold biopsy forceps technique.                           - One 5 mm polyp in the sigmoid colon, removed with                            a cold snare. Resected and retrieved.                           -  Diverticulosis in the left colon.                           - Internal hemorrhoids.                           - The examination was otherwise normal on direct                            and retroflexion views. Recommendation:           - Repeat colonoscopy in 5 years for surveillance                            (history of multiple polyps).                           - Patient has a contact number available for                            emergencies. The signs and symptoms of potential                            delayed complications were discussed with the                            patient. Return to normal activities tomorrow.                            Written discharge instructions were provided to the                            patient.                           - Resume previous diet.                           - Continue present medications.                           - Await pathology results. Docia Chuck. Henrene Pastor, MD 08/10/2019 9:13:33 AM This report has been signed electronically.

## 2019-08-10 NOTE — Progress Notes (Signed)
pt tolerated well. VSS. awake and to recovery. Report given to RN.  

## 2019-08-12 ENCOUNTER — Telehealth: Payer: Self-pay | Admitting: *Deleted

## 2019-08-12 NOTE — Telephone Encounter (Signed)
°  Follow up Call-  Call back number 08/10/2019  Post procedure Call Back phone  # [phone]  Permission to leave phone message Yes  Some recent data might be hidden     Patient questions:  Do you have a fever, pain , or abdominal swelling? No. Pain Score  0 *  Have you tolerated food without any problems? Yes.    Have you been able to return to your normal activities? Yes.    Do you have any questions about your discharge instructions: Diet   No. Medications  No. Follow up visit  No.  Do you have questions or concerns about your Care? No.  Actions: * If pain score is 4 or above: No action needed, pain <4  1. Have you developed a fever since your procedure? NO  2.   Have you had an respiratory symptoms (SOB or cough) since your procedure? NO  3.   Have you tested positive for COVID 19 since your procedure NO  4.   Have you had any family members/close contacts diagnosed with the COVID 19 since your procedure?  NO   If yes to any of these questions please route to Joylene John, RN and Erenest Rasher, RN

## 2019-08-13 ENCOUNTER — Encounter: Payer: Self-pay | Admitting: Internal Medicine

## 2019-08-24 DIAGNOSIS — H40051 Ocular hypertension, right eye: Secondary | ICD-10-CM | POA: Diagnosis not present

## 2019-08-24 DIAGNOSIS — H20041 Secondary noninfectious iridocyclitis, right eye: Secondary | ICD-10-CM | POA: Diagnosis not present

## 2019-08-24 DIAGNOSIS — H401111 Primary open-angle glaucoma, right eye, mild stage: Secondary | ICD-10-CM | POA: Diagnosis not present

## 2019-08-24 DIAGNOSIS — H40022 Open angle with borderline findings, high risk, left eye: Secondary | ICD-10-CM | POA: Diagnosis not present

## 2019-09-27 DIAGNOSIS — H20041 Secondary noninfectious iridocyclitis, right eye: Secondary | ICD-10-CM | POA: Diagnosis not present

## 2019-09-27 DIAGNOSIS — H401111 Primary open-angle glaucoma, right eye, mild stage: Secondary | ICD-10-CM | POA: Diagnosis not present

## 2019-09-27 DIAGNOSIS — H40051 Ocular hypertension, right eye: Secondary | ICD-10-CM | POA: Diagnosis not present

## 2019-09-27 DIAGNOSIS — H40022 Open angle with borderline findings, high risk, left eye: Secondary | ICD-10-CM | POA: Diagnosis not present

## 2019-09-30 ENCOUNTER — Other Ambulatory Visit: Payer: Self-pay

## 2019-09-30 ENCOUNTER — Encounter (INDEPENDENT_AMBULATORY_CARE_PROVIDER_SITE_OTHER): Payer: Medicare Other | Admitting: Ophthalmology

## 2019-09-30 DIAGNOSIS — D3132 Benign neoplasm of left choroid: Secondary | ICD-10-CM

## 2019-09-30 DIAGNOSIS — H43813 Vitreous degeneration, bilateral: Secondary | ICD-10-CM | POA: Diagnosis not present

## 2019-09-30 DIAGNOSIS — H35033 Hypertensive retinopathy, bilateral: Secondary | ICD-10-CM | POA: Diagnosis not present

## 2019-09-30 DIAGNOSIS — I10 Essential (primary) hypertension: Secondary | ICD-10-CM

## 2019-09-30 DIAGNOSIS — H34831 Tributary (branch) retinal vein occlusion, right eye, with macular edema: Secondary | ICD-10-CM | POA: Diagnosis not present

## 2019-10-10 ENCOUNTER — Other Ambulatory Visit: Payer: Self-pay | Admitting: Internal Medicine

## 2019-10-10 DIAGNOSIS — I10 Essential (primary) hypertension: Secondary | ICD-10-CM

## 2019-10-25 ENCOUNTER — Encounter: Payer: Self-pay | Admitting: Internal Medicine

## 2019-10-25 ENCOUNTER — Other Ambulatory Visit: Payer: Self-pay | Admitting: Internal Medicine

## 2019-10-25 DIAGNOSIS — I10 Essential (primary) hypertension: Secondary | ICD-10-CM

## 2019-10-25 MED ORDER — LOSARTAN POTASSIUM 100 MG PO TABS: 100.0000 mg | ORAL_TABLET | Freq: Every day | ORAL | 1 refills | Status: DC

## 2019-10-28 DIAGNOSIS — H20041 Secondary noninfectious iridocyclitis, right eye: Secondary | ICD-10-CM | POA: Diagnosis not present

## 2019-10-28 DIAGNOSIS — H401111 Primary open-angle glaucoma, right eye, mild stage: Secondary | ICD-10-CM | POA: Diagnosis not present

## 2019-10-28 DIAGNOSIS — H40051 Ocular hypertension, right eye: Secondary | ICD-10-CM | POA: Diagnosis not present

## 2019-10-28 DIAGNOSIS — H40022 Open angle with borderline findings, high risk, left eye: Secondary | ICD-10-CM | POA: Diagnosis not present

## 2019-11-02 DIAGNOSIS — Z23 Encounter for immunization: Secondary | ICD-10-CM | POA: Diagnosis not present

## 2019-12-09 ENCOUNTER — Encounter (INDEPENDENT_AMBULATORY_CARE_PROVIDER_SITE_OTHER): Payer: Medicare Other | Admitting: Ophthalmology

## 2019-12-09 ENCOUNTER — Other Ambulatory Visit: Payer: Self-pay

## 2019-12-09 DIAGNOSIS — H34831 Tributary (branch) retinal vein occlusion, right eye, with macular edema: Secondary | ICD-10-CM | POA: Diagnosis not present

## 2019-12-09 DIAGNOSIS — I10 Essential (primary) hypertension: Secondary | ICD-10-CM

## 2019-12-09 DIAGNOSIS — H35033 Hypertensive retinopathy, bilateral: Secondary | ICD-10-CM | POA: Diagnosis not present

## 2019-12-09 DIAGNOSIS — H43812 Vitreous degeneration, left eye: Secondary | ICD-10-CM

## 2019-12-09 DIAGNOSIS — D3132 Benign neoplasm of left choroid: Secondary | ICD-10-CM | POA: Diagnosis not present

## 2019-12-27 DIAGNOSIS — H40022 Open angle with borderline findings, high risk, left eye: Secondary | ICD-10-CM | POA: Diagnosis not present

## 2019-12-27 DIAGNOSIS — H401111 Primary open-angle glaucoma, right eye, mild stage: Secondary | ICD-10-CM | POA: Diagnosis not present

## 2019-12-27 DIAGNOSIS — H40051 Ocular hypertension, right eye: Secondary | ICD-10-CM | POA: Diagnosis not present

## 2019-12-27 DIAGNOSIS — H20041 Secondary noninfectious iridocyclitis, right eye: Secondary | ICD-10-CM | POA: Diagnosis not present

## 2019-12-28 ENCOUNTER — Other Ambulatory Visit: Payer: Self-pay | Admitting: Internal Medicine

## 2019-12-28 DIAGNOSIS — E039 Hypothyroidism, unspecified: Secondary | ICD-10-CM

## 2020-01-17 ENCOUNTER — Encounter: Payer: Self-pay | Admitting: Internal Medicine

## 2020-01-17 ENCOUNTER — Other Ambulatory Visit: Payer: Self-pay | Admitting: Internal Medicine

## 2020-01-17 DIAGNOSIS — E039 Hypothyroidism, unspecified: Secondary | ICD-10-CM

## 2020-01-17 MED ORDER — LEVOTHYROXINE SODIUM 50 MCG PO TABS: 50.0000 ug | ORAL_TABLET | Freq: Every day | ORAL | 0 refills | Status: DC

## 2020-01-25 ENCOUNTER — Other Ambulatory Visit: Payer: Self-pay | Admitting: Internal Medicine

## 2020-01-25 DIAGNOSIS — E785 Hyperlipidemia, unspecified: Secondary | ICD-10-CM

## 2020-02-02 DIAGNOSIS — H40022 Open angle with borderline findings, high risk, left eye: Secondary | ICD-10-CM | POA: Diagnosis not present

## 2020-02-02 DIAGNOSIS — H401111 Primary open-angle glaucoma, right eye, mild stage: Secondary | ICD-10-CM | POA: Diagnosis not present

## 2020-02-02 DIAGNOSIS — H40051 Ocular hypertension, right eye: Secondary | ICD-10-CM | POA: Diagnosis not present

## 2020-02-02 DIAGNOSIS — H20041 Secondary noninfectious iridocyclitis, right eye: Secondary | ICD-10-CM | POA: Diagnosis not present

## 2020-02-08 DIAGNOSIS — H401111 Primary open-angle glaucoma, right eye, mild stage: Secondary | ICD-10-CM | POA: Diagnosis not present

## 2020-02-14 ENCOUNTER — Other Ambulatory Visit: Payer: Self-pay

## 2020-02-14 ENCOUNTER — Encounter: Payer: Self-pay | Admitting: Internal Medicine

## 2020-02-14 ENCOUNTER — Ambulatory Visit (INDEPENDENT_AMBULATORY_CARE_PROVIDER_SITE_OTHER): Payer: Medicare Other | Admitting: Internal Medicine

## 2020-02-14 VITALS — BP 160/92 | HR 60 | Temp 98.2°F | Resp 16 | Ht 72.0 in | Wt 205.0 lb

## 2020-02-14 DIAGNOSIS — I10 Essential (primary) hypertension: Secondary | ICD-10-CM

## 2020-02-14 DIAGNOSIS — R7303 Prediabetes: Secondary | ICD-10-CM | POA: Diagnosis not present

## 2020-02-14 DIAGNOSIS — N400 Enlarged prostate without lower urinary tract symptoms: Secondary | ICD-10-CM

## 2020-02-14 DIAGNOSIS — E039 Hypothyroidism, unspecified: Secondary | ICD-10-CM

## 2020-02-14 DIAGNOSIS — E785 Hyperlipidemia, unspecified: Secondary | ICD-10-CM | POA: Diagnosis not present

## 2020-02-14 LAB — CBC WITH DIFFERENTIAL/PLATELET
Basophils Absolute: 0.1 10*3/uL (ref 0.0–0.1)
Basophils Relative: 0.7 % (ref 0.0–3.0)
Eosinophils Absolute: 0.2 10*3/uL (ref 0.0–0.7)
Eosinophils Relative: 2.9 % (ref 0.0–5.0)
HCT: 40.4 % (ref 39.0–52.0)
Hemoglobin: 13.6 g/dL (ref 13.0–17.0)
Lymphocytes Relative: 30.2 % (ref 12.0–46.0)
Lymphs Abs: 2.2 10*3/uL (ref 0.7–4.0)
MCHC: 33.6 g/dL (ref 30.0–36.0)
MCV: 84 fl (ref 78.0–100.0)
Monocytes Absolute: 0.6 10*3/uL (ref 0.1–1.0)
Monocytes Relative: 7.5 % (ref 3.0–12.0)
Neutro Abs: 4.3 10*3/uL (ref 1.4–7.7)
Neutrophils Relative %: 58.7 % (ref 43.0–77.0)
Platelets: 259 10*3/uL (ref 150.0–400.0)
RBC: 4.81 Mil/uL (ref 4.22–5.81)
RDW: 13.6 % (ref 11.5–15.5)
WBC: 7.3 10*3/uL (ref 4.0–10.5)

## 2020-02-14 LAB — URINALYSIS, ROUTINE W REFLEX MICROSCOPIC
Bilirubin Urine: NEGATIVE
Hgb urine dipstick: NEGATIVE
Ketones, ur: NEGATIVE
Leukocytes,Ua: NEGATIVE
Nitrite: NEGATIVE
RBC / HPF: NONE SEEN (ref 0–?)
Specific Gravity, Urine: 1.015 (ref 1.000–1.030)
Total Protein, Urine: NEGATIVE
Urine Glucose: NEGATIVE
Urobilinogen, UA: 0.2 (ref 0.0–1.0)
pH: 5.5 (ref 5.0–8.0)

## 2020-02-14 LAB — BASIC METABOLIC PANEL
BUN: 17 mg/dL (ref 6–23)
CO2: 25 mEq/L (ref 19–32)
Calcium: 9.8 mg/dL (ref 8.4–10.5)
Chloride: 100 mEq/L (ref 96–112)
Creatinine, Ser: 0.96 mg/dL (ref 0.40–1.50)
GFR: 81.8 mL/min (ref >60.00)
Glucose, Bld: 88 mg/dL (ref 70–99)
Potassium: 4.5 mEq/L (ref 3.5–5.1)
Sodium: 134 mEq/L — ABNORMAL LOW (ref 135–145)

## 2020-02-14 LAB — LIPID PANEL
Cholesterol: 165 mg/dL (ref 0–200)
HDL: 51.1 mg/dL (ref >39.00)
LDL Cholesterol: 85 mg/dL (ref 0–99)
NonHDL: 113.94
Total CHOL/HDL Ratio: 3
Triglycerides: 144 mg/dL (ref 0.0–149.0)
VLDL: 28.8 mg/dL (ref 0.0–40.0)

## 2020-02-14 LAB — HEPATIC FUNCTION PANEL
ALT: 31 U/L (ref 0–53)
AST: 30 U/L (ref 0–37)
Albumin: 4.7 g/dL (ref 3.5–5.2)
Alkaline Phosphatase: 92 U/L (ref 39–117)
Bilirubin, Direct: 0.2 mg/dL (ref 0.0–0.3)
Total Bilirubin: 1.4 mg/dL — ABNORMAL HIGH (ref 0.2–1.2)
Total Protein: 7.3 g/dL (ref 6.0–8.3)

## 2020-02-14 LAB — HEMOGLOBIN A1C: Hgb A1c MFr Bld: 6.2 % (ref 4.6–6.5)

## 2020-02-14 LAB — PSA: PSA: 1.27 ng/mL (ref 0.10–4.00)

## 2020-02-14 LAB — TSH: TSH: 4.79 u[IU]/mL — ABNORMAL HIGH (ref 0.35–4.50)

## 2020-02-14 MED ORDER — INDAPAMIDE 1.25 MG PO TABS
1.2500 mg | ORAL_TABLET | Freq: Every day | ORAL | 0 refills | Status: DC
Start: 2020-02-14 — End: 2020-05-02

## 2020-02-14 NOTE — Patient Instructions (Signed)

## 2020-02-14 NOTE — Progress Notes (Signed)
Subjective:  Patient ID: Antonio Acosta, male    DOB: 23-May-1950  Age: 70 y.o. MRN: [id number]  CC: Hypertension and Hyperlipidemia  This visit occurred during the SARS-CoV-2 public health emergency.  Safety protocols were in place, including screening questions prior to the visit, additional usage of staff PPE, and extensive cleaning of exam room while observing appropriate contact time as indicated for disinfecting solutions.    HPI Elzy Tomasello presents for f/up -   He has felt well recently and offers no complaints.  He is active and denies any recent episodes of headache, blurred vision, chest pain, shortness of breath, palpitations, edema, fatigue, or changes in his weight.  Outpatient Medications Prior to Visit  Medication Sig Dispense Refill  . ALREX 0.2 % SUSP Eye drops for retina/pressure    . aspirin 81 MG tablet Take 1 tablet (81 mg total) by mouth daily. 90 tablet 1  . atorvastatin (LIPITOR) 20 MG tablet TAKE 1 TABLET BY MOUTH EVERY DAY 90 tablet 0  . brimonidine (ALPHAGAN) 0.2 % ophthalmic solution     . Cholecalciferol (VITAMIN D PO) Take 1 capsule by mouth daily.    . dorzolamide-timolol (COSOPT) 22.3-6.8 MG/ML ophthalmic solution INSTILL 1 DROP INTO BOTH EYES TWICE A DAY    . levothyroxine (SYNTHROID) 50 MCG tablet Take 1 tablet (50 mcg total) by mouth daily before breakfast. 90 tablet 0  . losartan (COZAAR) 100 MG tablet Take 1 tablet (100 mg total) by mouth daily. 90 tablet 1  . metoprolol succinate (TOPROL-XL) 100 MG 24 hr tablet TAKE 1 TABLET BY MOUTH DAILY. TAKE WITH OR IMMEDIATELY FOLLOWING A MEAL. 90 tablet 1  . Vitamins/Minerals TABS Take by mouth.     No facility-administered medications prior to visit.    ROS Review of Systems  Constitutional: Negative for appetite change, diaphoresis, fatigue and unexpected weight change.  HENT: Negative.   Respiratory: Negative for cough, chest tightness, shortness of breath and wheezing.   Cardiovascular: Negative for  chest pain, palpitations and leg swelling.  Gastrointestinal: Negative for abdominal pain, diarrhea, nausea and vomiting.  Endocrine: Negative for cold intolerance and heat intolerance.  Genitourinary: Negative.  Negative for difficulty urinating and dysuria.  Musculoskeletal: Negative for arthralgias and myalgias.  Skin: Negative.  Negative for color change and pallor.  Neurological: Negative.  Negative for dizziness, weakness and light-headedness.  Hematological: Negative for adenopathy. Does not bruise/bleed easily.  Psychiatric/Behavioral: Negative.     Objective:  BP (!) 160/92   Pulse 60   Temp 98.2 F (36.8 C) (Oral)   Resp 16   Ht 6' (1.829 m)   Wt 205 lb (93 kg)   SpO2 93%   BMI 27.80 kg/m   BP Readings from Last 3 Encounters:  02/14/20 (!) 160/92  08/10/19 130/79  05/26/19 (!) 154/82    Wt Readings from Last 3 Encounters:  02/14/20 205 lb (93 kg)  08/10/19 206 lb (93.4 kg)  07/27/19 206 lb 12.8 oz (93.8 kg)    Physical Exam Cardiovascular:     Comments: EKG- Sinus bradycardia, 59 bpm No LVH Normal EKG Musculoskeletal:     Right lower leg: No edema.     Left lower leg: No edema.     Lab Results  Component Value Date   WBC 7.3 02/14/2020   HGB 13.6 02/14/2020   HCT 40.4 02/14/2020   PLT 259.0 02/14/2020   GLUCOSE 88 02/14/2020   CHOL 165 02/14/2020   TRIG 144.0 02/14/2020   HDL 51.10 02/14/2020  LDLDIRECT 137.6 09/11/2012   LDLCALC 85 02/14/2020   ALT 31 02/14/2020   AST 30 02/14/2020   NA 134 (L) 02/14/2020   K 4.5 02/14/2020   CL 100 02/14/2020   CREATININE 0.96 02/14/2020   BUN 17 02/14/2020   CO2 25 02/14/2020   TSH 4.79 (H) 02/14/2020   PSA 1.27 02/14/2020   INR 0.94 12/28/2011   HGBA1C 6.2 02/14/2020    No results found.  Assessment & Plan:   Arrie was seen today for hypertension and hyperlipidemia.  Diagnoses and all orders for this visit:  Benign prostatic hyperplasia without lower urinary tract symptoms- His PSA is low  which is a reassuring sign that he does not have prostate cancer.  He has no symptoms that need to be treated. -     Urinalysis, Routine w reflex microscopic; Future -     PSA; Future -     PSA -     Urinalysis, Routine w reflex microscopic  Acquired hypothyroidism- His TSH is elevated but clinically he appears euthyroid.  Will continue the current dose of T4. -     TSH; Future -     TSH  Essential hypertension, benign- His blood pressure is not adequately well controlled.  I recommended that he add a thiazide diuretic to his current regimen. -     CBC with Differential/Platelet; Future -     Hepatic function panel; Future -     Urinalysis, Routine w reflex microscopic; Future -     EKG 12-Lead -     indapamide (LOZOL) 1.25 MG tablet; Take 1 tablet (1.25 mg total) by mouth daily. -     Urinalysis, Routine w reflex microscopic -     Hepatic function panel -     CBC with Differential/Platelet  Prediabetes- His A1c is at 6.2%.  Medical therapy is not yet indicated. -     Basic metabolic panel; Future -     Hemoglobin A1c; Future -     Hemoglobin A1c -     Basic metabolic panel  Hyperlipidemia with target LDL less than 130- He has achieved his LDL goal and is doing well on the statin. -     Lipid panel; Future -     Hepatic function panel; Future -     Hepatic function panel -     Lipid panel   I am having Kenston Mee Hives start on indapamide. I am also having him maintain his aspirin, Cholecalciferol (VITAMIN D PO), dorzolamide-timolol, Alrex, Vitamins/Minerals, brimonidine, metoprolol succinate, losartan, levothyroxine, and atorvastatin.  Meds ordered this encounter  Medications  . indapamide (LOZOL) 1.25 MG tablet    Sig: Take 1 tablet (1.25 mg total) by mouth daily.    Dispense:  90 tablet    Refill:  0     Follow-up: Return in about 3 months (around 05/13/2020).  Scarlette Calico, MD

## 2020-02-15 ENCOUNTER — Encounter: Payer: Self-pay | Admitting: Internal Medicine

## 2020-02-24 ENCOUNTER — Other Ambulatory Visit: Payer: Self-pay

## 2020-02-24 ENCOUNTER — Encounter (INDEPENDENT_AMBULATORY_CARE_PROVIDER_SITE_OTHER): Payer: Medicare Other | Admitting: Ophthalmology

## 2020-02-24 DIAGNOSIS — H35033 Hypertensive retinopathy, bilateral: Secondary | ICD-10-CM | POA: Diagnosis not present

## 2020-02-24 DIAGNOSIS — H34831 Tributary (branch) retinal vein occlusion, right eye, with macular edema: Secondary | ICD-10-CM | POA: Diagnosis not present

## 2020-02-24 DIAGNOSIS — H43812 Vitreous degeneration, left eye: Secondary | ICD-10-CM | POA: Diagnosis not present

## 2020-02-24 DIAGNOSIS — I10 Essential (primary) hypertension: Secondary | ICD-10-CM | POA: Diagnosis not present

## 2020-02-24 DIAGNOSIS — D3132 Benign neoplasm of left choroid: Secondary | ICD-10-CM

## 2020-03-20 DIAGNOSIS — H40051 Ocular hypertension, right eye: Secondary | ICD-10-CM | POA: Diagnosis not present

## 2020-03-20 DIAGNOSIS — H401111 Primary open-angle glaucoma, right eye, mild stage: Secondary | ICD-10-CM | POA: Diagnosis not present

## 2020-03-20 DIAGNOSIS — H40022 Open angle with borderline findings, high risk, left eye: Secondary | ICD-10-CM | POA: Diagnosis not present

## 2020-03-20 DIAGNOSIS — H20041 Secondary noninfectious iridocyclitis, right eye: Secondary | ICD-10-CM | POA: Diagnosis not present

## 2020-03-30 ENCOUNTER — Other Ambulatory Visit: Payer: Self-pay | Admitting: Internal Medicine

## 2020-03-30 DIAGNOSIS — E039 Hypothyroidism, unspecified: Secondary | ICD-10-CM

## 2020-04-05 ENCOUNTER — Other Ambulatory Visit: Payer: Self-pay | Admitting: Internal Medicine

## 2020-04-05 DIAGNOSIS — I10 Essential (primary) hypertension: Secondary | ICD-10-CM

## 2020-04-20 DIAGNOSIS — Z23 Encounter for immunization: Secondary | ICD-10-CM | POA: Diagnosis not present

## 2020-04-27 ENCOUNTER — Other Ambulatory Visit: Payer: Self-pay | Admitting: Internal Medicine

## 2020-04-27 DIAGNOSIS — I10 Essential (primary) hypertension: Secondary | ICD-10-CM

## 2020-05-02 ENCOUNTER — Other Ambulatory Visit: Payer: Self-pay | Admitting: Internal Medicine

## 2020-05-02 DIAGNOSIS — I10 Essential (primary) hypertension: Secondary | ICD-10-CM

## 2020-05-02 DIAGNOSIS — E785 Hyperlipidemia, unspecified: Secondary | ICD-10-CM

## 2020-05-05 DIAGNOSIS — H40022 Open angle with borderline findings, high risk, left eye: Secondary | ICD-10-CM | POA: Diagnosis not present

## 2020-05-05 DIAGNOSIS — H40051 Ocular hypertension, right eye: Secondary | ICD-10-CM | POA: Diagnosis not present

## 2020-05-05 DIAGNOSIS — H20041 Secondary noninfectious iridocyclitis, right eye: Secondary | ICD-10-CM | POA: Diagnosis not present

## 2020-05-05 DIAGNOSIS — H401111 Primary open-angle glaucoma, right eye, mild stage: Secondary | ICD-10-CM | POA: Diagnosis not present

## 2020-05-10 ENCOUNTER — Encounter (INDEPENDENT_AMBULATORY_CARE_PROVIDER_SITE_OTHER): Payer: Medicare Other | Admitting: Ophthalmology

## 2020-05-10 ENCOUNTER — Other Ambulatory Visit: Payer: Self-pay

## 2020-05-10 DIAGNOSIS — D3132 Benign neoplasm of left choroid: Secondary | ICD-10-CM

## 2020-05-10 DIAGNOSIS — H34831 Tributary (branch) retinal vein occlusion, right eye, with macular edema: Secondary | ICD-10-CM

## 2020-05-10 DIAGNOSIS — H35033 Hypertensive retinopathy, bilateral: Secondary | ICD-10-CM | POA: Diagnosis not present

## 2020-05-10 DIAGNOSIS — H35371 Puckering of macula, right eye: Secondary | ICD-10-CM

## 2020-05-10 DIAGNOSIS — I10 Essential (primary) hypertension: Secondary | ICD-10-CM

## 2020-05-10 DIAGNOSIS — H43812 Vitreous degeneration, left eye: Secondary | ICD-10-CM

## 2020-06-15 DIAGNOSIS — H40051 Ocular hypertension, right eye: Secondary | ICD-10-CM | POA: Diagnosis not present

## 2020-06-15 DIAGNOSIS — H20041 Secondary noninfectious iridocyclitis, right eye: Secondary | ICD-10-CM | POA: Diagnosis not present

## 2020-06-15 DIAGNOSIS — H401111 Primary open-angle glaucoma, right eye, mild stage: Secondary | ICD-10-CM | POA: Diagnosis not present

## 2020-06-15 DIAGNOSIS — H40022 Open angle with borderline findings, high risk, left eye: Secondary | ICD-10-CM | POA: Diagnosis not present

## 2020-07-07 ENCOUNTER — Telehealth: Payer: Self-pay | Admitting: Internal Medicine

## 2020-07-07 NOTE — Chronic Care Management (AMB) (Signed)
  Chronic Care Management   Outreach Note  07/07/2020 Name: Antonio Acosta MRN: [id number] DOB: [birth year]  Referred by: Janith Lima, MD Reason for referral : No chief complaint on file.   An unsuccessful telephone outreach was attempted today. The patient was referred to the pharmacist for assistance with care management and care coordination.   Follow Up Plan:   Lauretta Grill Upstream Scheduler

## 2020-07-07 NOTE — Progress Notes (Signed)
  Chronic Care Management   Note  07/07/2020 Name: Jakeb Lamping MRN: [id number] DOB: 11/15/1950  Dailyn Kempner is a 70 y.o. year old male who is a primary care patient of Janith Lima, MD. I reached out to [name] by phone today in response to a referral sent by Mr. Lowen Barringer PCP, Janith Lima, MD.   Mr. Koble was given information about Chronic Care Management services today including:  CCM service includes personalized support from designated clinical staff supervised by his physician, including individualized plan of care and coordination with other care providers 24/7 contact phone numbers for assistance for urgent and routine care needs. Service will only be billed when office clinical staff spend 20 minutes or more in a month to coordinate care. Only one practitioner may furnish and bill the service in a calendar month. The patient may stop CCM services at any time (effective at the end of the month) by phone call to the office staff.   Patient wishes to consider information provided and/or speak with a member of the care team before deciding about enrollment in care management services.   Follow up plan:  Lauretta Grill Upstream Scheduler

## 2020-07-19 ENCOUNTER — Other Ambulatory Visit: Payer: Self-pay

## 2020-07-19 ENCOUNTER — Encounter (INDEPENDENT_AMBULATORY_CARE_PROVIDER_SITE_OTHER): Payer: Medicare Other | Admitting: Ophthalmology

## 2020-07-19 DIAGNOSIS — H34831 Tributary (branch) retinal vein occlusion, right eye, with macular edema: Secondary | ICD-10-CM

## 2020-07-19 DIAGNOSIS — H43813 Vitreous degeneration, bilateral: Secondary | ICD-10-CM

## 2020-07-19 DIAGNOSIS — I10 Essential (primary) hypertension: Secondary | ICD-10-CM

## 2020-07-19 DIAGNOSIS — D3132 Benign neoplasm of left choroid: Secondary | ICD-10-CM | POA: Diagnosis not present

## 2020-07-19 DIAGNOSIS — H35033 Hypertensive retinopathy, bilateral: Secondary | ICD-10-CM

## 2020-07-30 ENCOUNTER — Other Ambulatory Visit: Payer: Self-pay | Admitting: Internal Medicine

## 2020-07-30 DIAGNOSIS — E785 Hyperlipidemia, unspecified: Secondary | ICD-10-CM

## 2020-08-01 ENCOUNTER — Other Ambulatory Visit: Payer: Self-pay | Admitting: Internal Medicine

## 2020-08-01 DIAGNOSIS — I10 Essential (primary) hypertension: Secondary | ICD-10-CM

## 2020-08-30 DIAGNOSIS — H04123 Dry eye syndrome of bilateral lacrimal glands: Secondary | ICD-10-CM | POA: Diagnosis not present

## 2020-08-30 DIAGNOSIS — H40022 Open angle with borderline findings, high risk, left eye: Secondary | ICD-10-CM | POA: Diagnosis not present

## 2020-08-30 DIAGNOSIS — H401111 Primary open-angle glaucoma, right eye, mild stage: Secondary | ICD-10-CM | POA: Diagnosis not present

## 2020-08-30 DIAGNOSIS — H40051 Ocular hypertension, right eye: Secondary | ICD-10-CM | POA: Diagnosis not present

## 2020-08-31 ENCOUNTER — Telehealth: Payer: Self-pay | Admitting: Internal Medicine

## 2020-08-31 NOTE — Telephone Encounter (Signed)
Left message for patient to call me back at [phone] to schedule Medicare Annual Wellness Visit   No hx of AWV eligible as of 08/08/18  Please schedule at anytime with [facility] Advisor if patient calls the office back.    40 Minutes appointment   Any questions, please call me at [phone]

## 2020-09-06 ENCOUNTER — Ambulatory Visit (INDEPENDENT_AMBULATORY_CARE_PROVIDER_SITE_OTHER): Payer: Medicare Other

## 2020-09-06 DIAGNOSIS — Z0000 Encounter for general adult medical examination without abnormal findings: Secondary | ICD-10-CM

## 2020-09-06 NOTE — Patient Instructions (Addendum)
Mr. Antonio Acosta , Thank you for taking time to come for your Medicare Wellness Visit. I appreciate your ongoing commitment to your health goals. Please review the following plan we discussed and let me know if I can assist you in the future.   Screening recommendations/referrals: Colonoscopy: 08/10/2019; due every 5 years Recommended yearly ophthalmology/optometry visit for glaucoma screening and checkup Recommended yearly dental visit for hygiene and checkup  Vaccinations: Influenza vaccine: due Fall 2022 Pneumococcal vaccine: 05/26/2019; need Pneumovax 23 (can check with [facility], [facility], [facility].) Tdap vaccine: 07/27/2015; due every 10 years Shingles vaccine: 09/22/2017, 11/24/2017   Covid-19: 02/19/2019, 03/19/2019, 11/02/2019, 04/20/2020  Advanced directives: Please bring a copy of your health care power of attorney and living will to the office at your convenience.  Conditions/risks identified: Yes; Client understands the importance of follow-up with providers by attending scheduled visits and discussed goals to eat healthier, increase physical activity, exercise the brain, socialize more, get enough sleep and make time for laughter.  Next appointment: Please schedule your next Medicare Wellness Visit with your Nurse Health Advisor in 1 year by calling [phone].  Preventive Care 68 Years and Older, Male Preventive care refers to lifestyle choices and visits with your health care provider that can promote health and wellness. What does preventive care include? A yearly physical exam. This is also called an annual well check. Dental exams once or twice a year. Routine eye exams. Ask your health care provider how often you should have your eyes checked. Personal lifestyle choices, including: Daily care of your teeth and gums. Regular physical activity. Eating a healthy diet. Avoiding tobacco and drug use. Limiting alcohol use. Practicing safe sex. Taking low doses of aspirin every day. Taking  vitamin and mineral supplements as recommended by your health care provider. What happens during an annual well check? The services and screenings done by your health care provider during your annual well check will depend on your age, overall health, lifestyle risk factors, and family history of disease. Counseling  Your health care provider may ask you questions about your: Alcohol use. Tobacco use. Drug use. Emotional well-being. Home and relationship well-being. Sexual activity. Eating habits. History of falls. Memory and ability to understand (cognition). Work and work Statistician. Screening  You may have the following tests or measurements: Height, weight, and BMI. Blood pressure. Lipid and cholesterol levels. These may be checked every 5 years, or more frequently if you are over 79 years old. Skin check. Lung cancer screening. You may have this screening every year starting at age 68 if you have a 30-pack-year history of smoking and currently smoke or have quit within the past 15 years. Fecal occult blood test (FOBT) of the stool. You may have this test every year starting at age 68. Flexible sigmoidoscopy or colonoscopy. You may have a sigmoidoscopy every 5 years or a colonoscopy every 10 years starting at age 70. Prostate cancer screening. Recommendations will vary depending on your family history and other risks. Hepatitis C blood test. Hepatitis B blood test. Sexually transmitted disease (STD) testing. Diabetes screening. This is done by checking your blood sugar (glucose) after you have not eaten for a while (fasting). You may have this done every 1-3 years. Abdominal aortic aneurysm (AAA) screening. You may need this if you are a current or former smoker. Osteoporosis. You may be screened starting at age 68 if you are at high risk. Talk with your health care provider about your test results, treatment options, and if necessary, the need  for more tests. Vaccines  Your  health care provider may recommend certain vaccines, such as: Influenza vaccine. This is recommended every year. Tetanus, diphtheria, and acellular pertussis (Tdap, Td) vaccine. You may need a Td booster every 10 years. Zoster vaccine. You may need this after age 68. Pneumococcal 13-valent conjugate (PCV13) vaccine. One dose is recommended after age 68. Pneumococcal polysaccharide (PPSV23) vaccine. One dose is recommended after age 68. Talk to your health care provider about which screenings and vaccines you need and how often you need them. This information is not intended to replace advice given to you by your health care provider. Make sure you discuss any questions you have with your health care provider. Document Released: 01/20/2015 Document Revised: 09/13/2015 Document Reviewed: 10/25/2014 Elsevier Interactive Patient Education  2017 [place] Prevention in the Home Falls can cause injuries. They can happen to people of all ages. There are many things you can do to make your home safe and to help prevent falls. What can I do on the outside of my home? Regularly fix the edges of walkways and driveways and fix any cracks. Remove anything that might make you trip as you walk through a door, such as a raised step or threshold. Trim any bushes or trees on the path to your home. Use bright outdoor lighting. Clear any walking paths of anything that might make someone trip, such as rocks or tools. Regularly check to see if handrails are loose or broken. Make sure that both sides of any steps have handrails. Any raised decks and porches should have guardrails on the edges. Have any leaves, snow, or ice cleared regularly. Use sand or salt on walking paths during winter. Clean up any spills in your garage right away. This includes oil or grease spills. What can I do in the bathroom? Use night lights. Install grab bars by the toilet and in the tub and shower. Do not use towel bars as  grab bars. Use non-skid mats or decals in the tub or shower. If you need to sit down in the shower, use a plastic, non-slip stool. Keep the floor dry. Clean up any water that spills on the floor as soon as it happens. Remove soap buildup in the tub or shower regularly. Attach bath mats securely with double-sided non-slip rug tape. Do not have throw rugs and other things on the floor that can make you trip. What can I do in the bedroom? Use night lights. Make sure that you have a light by your bed that is easy to reach. Do not use any sheets or blankets that are too big for your bed. They should not hang down onto the floor. Have a firm chair that has side arms. You can use this for support while you get dressed. Do not have throw rugs and other things on the floor that can make you trip. What can I do in the kitchen? Clean up any spills right away. Avoid walking on wet floors. Keep items that you use a lot in easy-to-reach places. If you need to reach something above you, use a strong step stool that has a grab bar. Keep electrical cords out of the way. Do not use floor polish or wax that makes floors slippery. If you must use wax, use non-skid floor wax. Do not have throw rugs and other things on the floor that can make you trip. What can I do with my stairs? Do not leave any items on the stairs.  Make sure that there are handrails on both sides of the stairs and use them. Fix handrails that are broken or loose. Make sure that handrails are as long as the stairways. Check any carpeting to make sure that it is firmly attached to the stairs. Fix any carpet that is loose or worn. Avoid having throw rugs at the top or bottom of the stairs. If you do have throw rugs, attach them to the floor with carpet tape. Make sure that you have a light switch at the top of the stairs and the bottom of the stairs. If you do not have them, ask someone to add them for you. What else can I do to help prevent  falls? Wear shoes that: Do not have high heels. Have rubber bottoms. Are comfortable and fit you well. Are closed at the toe. Do not wear sandals. If you use a stepladder: Make sure that it is fully opened. Do not climb a closed stepladder. Make sure that both sides of the stepladder are locked into place. Ask someone to hold it for you, if possible. Clearly mark and make sure that you can see: Any grab bars or handrails. First and last steps. Where the edge of each step is. Use tools that help you move around (mobility aids) if they are needed. These include: Canes. Walkers. Scooters. Crutches. Turn on the lights when you go into a dark area. Replace any light bulbs as soon as they burn out. Set up your furniture so you have a clear path. Avoid moving your furniture around. If any of your floors are uneven, fix them. If there are any pets around you, be aware of where they are. Review your medicines with your doctor. Some medicines can make you feel dizzy. This can increase your chance of falling. Ask your doctor what other things that you can do to help prevent falls. This information is not intended to replace advice given to you by your health care provider. Make sure you discuss any questions you have with your health care provider. Document Released: 10/20/2008 Document Revised: 06/01/2015 Document Reviewed: 01/28/2014 Elsevier Interactive Patient Education  2017 [employer].

## 2020-09-06 NOTE — Progress Notes (Signed)
I connected with Antonio Acosta today by telephone and verified that I am speaking with the correct person using two identifiers. Location patient: home Location provider: work Persons participating in the virtual visit: patient, provider.   I discussed the limitations, risks, security and privacy concerns of performing an evaluation and management service by telephone and the availability of in person appointments. I also discussed with the patient that there may be a patient responsible charge related to this service. The patient expressed understanding and verbally consented to this telephonic visit.    Interactive audio and video telecommunications were attempted between this provider and patient, however failed, due to patient having technical difficulties OR patient did not have access to video capability.  We continued and completed visit with audio only.  Some vital signs may be absent or patient reported.   Time Spent with patient on telephone encounter: 30 minutes  Subjective:   Antonio Acosta is a 70 y.o. male who presents for an Initial Medicare Annual Wellness Visit.  Review of Systems     Cardiac Risk Factors include: advanced age (>44mn, >>8women);dyslipidemia;family history of premature cardiovascular disease;hypertension;male gender     Objective:    There were no vitals filed for this visit. There is no height or weight on file to calculate BMI.  Advanced Directives 09/06/2020 10/14/2017 05/25/2014 12/27/2011  Does Patient Have a Medical Advance Directive? Yes No No Patient does not have advance directive;Patient would like information  Type of Advance Directive Living will;Healthcare Power of Attorney - - -  CItascain Chart? No - copy requested - - -  Would patient like information on creating a medical advance directive? - No - Patient declined Yes - Educational materials given Advance directive packet given  Pre-existing out of facility  DNR order (yellow form or pink MOST form) - - - No    Current Medications (verified) Outpatient Encounter Medications as of 09/06/2020  Medication Sig   ALREX 0.2 % SUSP Eye drops for retina/pressure   aspirin 81 MG tablet Take 1 tablet (81 mg total) by mouth daily.   atorvastatin (LIPITOR) 20 MG tablet TAKE 1 TABLET BY MOUTH EVERY DAY   brimonidine (ALPHAGAN) 0.2 % ophthalmic solution    Cholecalciferol (VITAMIN D PO) Take 1 capsule by mouth daily.   dorzolamide-timolol (COSOPT) 22.3-6.8 MG/ML ophthalmic solution INSTILL 1 DROP INTO BOTH EYES TWICE A DAY   indapamide (LOZOL) 1.25 MG tablet TAKE 1 TABLET BY MOUTH EVERY DAY   losartan (COZAAR) 100 MG tablet TAKE 1 TABLET BY MOUTH EVERY DAY   metoprolol succinate (TOPROL-XL) 100 MG 24 hr tablet TAKE 1 TABLET BY MOUTH DAILY. TAKE WITH OR IMMEDIATELY FOLLOWING A MEAL.   SYNTHROID 50 MCG tablet TAKE 1 TABLET (50 MCG TOTAL) DAILY BEFORE BREAKFAST FOR HYPOTHYROIDISM.   Vitamins/Minerals TABS Take by mouth.   No facility-administered encounter medications on file as of 09/06/2020.    Allergies (verified) Patient has no known allergies.   History: Past Medical History:  Diagnosis Date   Cataract    MD just currently watching   Glaucoma    Heart murmur    full w/u with negative findings approx 12 - 15 yrs ago   High cholesterol    Hypertension    Substance abuse ([place]    Thyroid disease    Past Surgical History:  Procedure Laterality Date   AIR/FLUID EXCHANGE Right 10/14/2017   Procedure: RIGHT AIR/FLUID EXCHANGE;  Surgeon: MHayden Pedro MD;  Location:  [facility];  Service: Ophthalmology;  Laterality: Right;   APPENDECTOMY     CATARACT EXTRACTION W/ INTRAOCULAR LENS IMPLANT Right 09/11/2017   COLONOSCOPY  12/24/2012   Henrene Pastor - Tics, 4 polyps SSA   COLONOSCOPY W/ POLYPECTOMY     EYE SURGERY     laser   LAPAROSCOPIC APPENDECTOMY N/A 05/25/2014   Procedure: APPENDECTOMY LAPAROSCOPIC;  Surgeon: Autumn Messing III, MD;  Location: [facility];   Service: General;  Laterality: N/A;   LASER PHOTO ABLATION Right 10/14/2017   Procedure: LASER PHOTO ABLATION RIGHT EYE;  Surgeon: Hayden Pedro, MD;  Location: [facility];  Service: Ophthalmology;  Laterality: Right;   MEMBRANE PEEL Right 10/14/2017   Procedure: RIGHT MEMBRANE PEEL;  Surgeon: Hayden Pedro, MD;  Location: [facility];  Service: Ophthalmology;  Laterality: Right;   PARS PLANA VITRECTOMY Right 10/14/2017   RIGHT 25G PARS PLANA VITRECTOMY WITH REMOVABLE INTRAOCULAR LENS FROM VITREOUS; PLACEMENT OF SECONDARY INTRAOCULAR LENS WITH SUTURE   PARS PLANA VITRECTOMY Right 10/14/2017   Procedure: RIGHT 25G PARS PLANA VITRECTOMY WITH REMOVABLE INTRAOCULAR LENS FROM VITREOUS; PLACEMENT OF SECONDARY INTRAOCULAR LENS WITH SUTURE;  Surgeon: Hayden Pedro, MD;  Location: [place];  Service: Ophthalmology;  Laterality: Right;   TONSILLECTOMY     Family History  Problem Relation Age of Onset   Hypertension Father    Stroke Mother    Kidney disease Neg Hx    Hyperlipidemia Neg Hx    Heart disease Neg Hx    Early death Neg Hx    Drug abuse Neg Hx    Diabetes Neg Hx    Cancer Neg Hx    Colon cancer Neg Hx    Stomach cancer Neg Hx    Rectal cancer Neg Hx    Social History   Socioeconomic History   Marital status: Married    Spouse name: Not on file   Number of children: Not on file   Years of education: Not on file   Highest education level: Not on file  Occupational History   Not on file  Tobacco Use   Smoking status: Former    Types: Cigarettes   Smokeless tobacco: Never   Tobacco comments:    quit at age 39yr  Currently has an occasional cigar  Vaping Use   Vaping Use: Never used  Substance and Sexual Activity   Alcohol use: Not Currently    Alcohol/week: 0.0 standard drinks   Drug use: Not Currently    Types: Marijuana    Comment: None in last 20 yrs   Sexual activity: Yes  Other Topics Concern   Not on file  Social History Narrative   Not on file   Social Determinants  of Health   Financial Resource Strain: Low Risk    Difficulty of Paying Living Expenses: Not hard at all  Food Insecurity: No Food Insecurity   Worried About RCharity fundraiserin the Last Year: Never true   Ran Out of Food in the Last Year: Never true  Transportation Needs: No Transportation Needs   Lack of Transportation (Medical): No   Lack of Transportation (Non-Medical): No  Physical Activity: Sufficiently Active   Days of Exercise per Week: 5 days   Minutes of Exercise per Session: 30 min  Stress: No Stress Concern Present   Feeling of Stress : Not at all  Social Connections: Socially Integrated   Frequency of Communication with Friends and Family: More than three times a week   Frequency of  Social Gatherings with Friends and Family: Three times a week   Attends Religious Services: More than 4 times per year   Active Member of Clubs or Organizations: Yes   Attends Archivist Meetings: 1 to 4 times per year   Marital Status: Married    Tobacco Counseling Counseling given: Not Answered Tobacco comments: quit at age 32yr  Currently has an occasional cigar   Clinical Intake:  Pre-visit preparation completed: Yes  Pain : No/denies pain     Nutritional Risks: None Diabetes: No  How often do you need to have someone help you when you read instructions, pamphlets, or other written materials from your doctor or pharmacy?: 1 - Never  Diabetic? no  Interpreter Needed?: No  Information entered by :: SLisette Abu LPN   Activities of Daily Living In your present state of health, do you have any difficulty performing the following activities: 09/06/2020 02/14/2020  Hearing? N N  Vision? N N  Difficulty concentrating or making decisions? N N  Walking or climbing stairs? N N  Dressing or bathing? N N  Doing errands, shopping? N N  Preparing Food and eating ? N -  Using the Toilet? N -  In the past six months, have you accidently leaked urine? N -  Do you  have problems with loss of bowel control? N -  Managing your Medications? N -  Managing your Finances? N -  Housekeeping or managing your Housekeeping? N -  Some recent data might be hidden    Patient Care Team: JJanith Lima MD as PCP - General (Internal Medicine) WHortencia Pilar MD as Consulting Physician (Ophthalmology)  Indicate any recent Medical Services you may have received from other than [facility] providers in the past year (date may be approximate).     Assessment:   This is a routine wellness examination for Bennett.  Hearing/Vision screen Hearing Screening - Comments:: Patient denied any hearing difficulty. Vision Screening - Comments:: Patient wears corrective lenses.  Being treated by Dr. CMarshall Corkevery 2-3 months for glaucoma.  Dietary issues and exercise activities discussed: Current Exercise Habits: Home exercise routine, Type of exercise: walking, Time (Minutes): 30, Frequency (Times/Week): 5, Weekly Exercise (Minutes/Week): 150, Intensity: Moderate, Exercise limited by: None identified   Goals Addressed               This Visit's Progress     Patient Stated (pt-stated)        To maintain my current health status by continuing to eat healthy, stay physically active and socially active.      Depression Screen PHQ 2/9 Scores 09/06/2020 02/14/2020 02/11/2019 08/04/2017 07/24/2016  PHQ - 2 Score 0 0 0 0 0  PHQ- 9 Score - - - - 0    Fall Risk Fall Risk  09/06/2020 02/14/2020 02/11/2019  Falls in the past year? 0 0 0  Number falls in past yr: 0 - 0  Injury with Fall? 0 - 0  Risk for fall due to : No Fall Risks - -  Follow up Falls evaluation completed - Falls evaluation completed    [place]  Any stairs in or around the home? Yes  If so, are there any without handrails? No  Home free of loose throw rugs in walkways, pet beds, electrical cords, etc? Yes  Adequate lighting in your home to reduce risk of falls? Yes    ASSISTIVE DEVICES UTILIZED TO PREVENT FALLS:  Life alert? No  Use of a cane, walker or w/c? No  Grab bars in the bathroom? Yes  Shower chair or bench in shower? Yes  Elevated toilet seat or a handicapped toilet? Yes   TIMED UP AND GO:  Was the test performed? No .  Length of time to ambulate 10 feet: n/a sec.   Gait steady and fast without use of assistive device  Cognitive Function: Normal cognitive status assessed by direct observation by this Nurse Health Advisor. No abnormalities found.   Hearing Screening - Comments:: Patient denied any hearing difficulty. Vision Screening - Comments:: Patient wears corrective lenses.  Being treated by Dr. Marshall Cork every 2-3 months for glaucoma.         Immunizations Immunization History  Administered Date(s) Administered   Influenza, High Dose Seasonal PF 11/14/2017   Influenza,inj,Quad PF,6+ Mos 09/11/2012, 09/05/2016   Moderna Sars-Covid-2 Vaccination 02/19/2019, 03/19/2019, 11/02/2019, 04/20/2020   Pneumococcal Conjugate-13 05/26/2019   Tdap 07/25/2004, 07/27/2015   Zoster Recombinat (Shingrix) 09/22/2017, 11/24/2017   Zoster, Live 01/15/2013    TDAP status: Up to date  Flu Vaccine status: Due, Education has been provided regarding the importance of this vaccine. Advised may receive this vaccine at local pharmacy or Health Dept. Aware to provide a copy of the vaccination record if obtained from local pharmacy or Health Dept. Verbalized acceptance and understanding.  Pneumococcal vaccine status: Due, Education has been provided regarding the importance of this vaccine. Advised may receive this vaccine at local pharmacy or Health Dept. Aware to provide a copy of the vaccination record if obtained from local pharmacy or Health Dept. Verbalized acceptance and understanding.  Covid-19 vaccine status: Completed vaccines  Qualifies for Shingles Vaccine? Yes   Zostavax completed Yes   Shingrix Completed?: Yes  Screening  Tests Health Maintenance  Topic Date Due   PNA vac Low Risk Adult (2 of 2 - PPSV23) 05/25/2020   INFLUENZA VACCINE  08/07/2020   COLONOSCOPY (Pts 45-5yr Insurance coverage will need to be confirmed)  08/09/2024   TETANUS/TDAP  07/26/2025   COVID-19 Vaccine  Completed   Hepatitis C Screening  Completed   Zoster Vaccines- Shingrix  Completed   HPV VACCINES  Aged Out    Health Maintenance  Health Maintenance Due  Topic Date Due   PNA vac Low Risk Adult (2 of 2 - PPSV23) 05/25/2020   INFLUENZA VACCINE  08/07/2020    Colorectal cancer screening: Type of screening: Colonoscopy. Completed 08/10/2019. Repeat every 5 years  Lung Cancer Screening: (Low Dose CT Chest recommended if Age 68-80years, 30 pack-year currently smoking OR have quit w/in 15years.) does not qualify.   Lung Cancer Screening Referral: no  Additional Screening:  Hepatitis C Screening: does qualify; Completed yes  Vision Screening: Recommended annual ophthalmology exams for early detection of glaucoma and other disorders of the eye. Is the patient up to date with their annual eye exam?  Yes  Who is the provider or what is the name of the office in which the patient attends annual eye exams? CMarshall Cork MD. If pt is not established with a provider, would they like to be referred to a provider to establish care? No .   Dental Screening: Recommended annual dental exams for proper oral hygiene  Community Resource Referral / Chronic Care Management: CRR required this visit?  No   CCM required this visit?  No      Plan:     I have personally reviewed and noted the following in the patient's chart:  Medical and social history Use of alcohol, tobacco or illicit drugs  Current medications and supplements including opioid prescriptions. Patient is not currently taking opioid prescriptions. Functional ability and status Nutritional status Physical activity Advanced directives List of other  physicians Hospitalizations, surgeries, and ER visits in previous 12 months Vitals Screenings to include cognitive, depression, and falls Referrals and appointments  In addition, I have reviewed and discussed with patient certain preventive protocols, quality metrics, and best practice recommendations. A written personalized care plan for preventive services as well as general preventive health recommendations were provided to patient.     Sheral Flow, LPN   [id number]   Nurse Notes:  There were no vitals filed for this visit. There is no height or weight on file to calculate BMI. Patient stated that he has no issues with gait or balance; does not use any assistive devices.

## 2020-09-28 DIAGNOSIS — H40051 Ocular hypertension, right eye: Secondary | ICD-10-CM | POA: Diagnosis not present

## 2020-09-28 DIAGNOSIS — H40022 Open angle with borderline findings, high risk, left eye: Secondary | ICD-10-CM | POA: Diagnosis not present

## 2020-09-28 DIAGNOSIS — H20041 Secondary noninfectious iridocyclitis, right eye: Secondary | ICD-10-CM | POA: Diagnosis not present

## 2020-09-28 DIAGNOSIS — H401111 Primary open-angle glaucoma, right eye, mild stage: Secondary | ICD-10-CM | POA: Diagnosis not present

## 2020-10-01 ENCOUNTER — Other Ambulatory Visit: Payer: Self-pay | Admitting: Internal Medicine

## 2020-10-01 DIAGNOSIS — I10 Essential (primary) hypertension: Secondary | ICD-10-CM

## 2020-10-04 ENCOUNTER — Other Ambulatory Visit: Payer: Self-pay

## 2020-10-04 ENCOUNTER — Encounter (INDEPENDENT_AMBULATORY_CARE_PROVIDER_SITE_OTHER): Payer: Medicare Other | Admitting: Ophthalmology

## 2020-10-04 DIAGNOSIS — D3132 Benign neoplasm of left choroid: Secondary | ICD-10-CM

## 2020-10-04 DIAGNOSIS — H34831 Tributary (branch) retinal vein occlusion, right eye, with macular edema: Secondary | ICD-10-CM

## 2020-10-04 DIAGNOSIS — H35033 Hypertensive retinopathy, bilateral: Secondary | ICD-10-CM | POA: Diagnosis not present

## 2020-10-04 DIAGNOSIS — H43812 Vitreous degeneration, left eye: Secondary | ICD-10-CM | POA: Diagnosis not present

## 2020-10-04 DIAGNOSIS — I10 Essential (primary) hypertension: Secondary | ICD-10-CM | POA: Diagnosis not present

## 2020-10-19 DIAGNOSIS — Z23 Encounter for immunization: Secondary | ICD-10-CM | POA: Diagnosis not present

## 2020-10-23 ENCOUNTER — Other Ambulatory Visit: Payer: Self-pay

## 2020-10-23 DIAGNOSIS — E785 Hyperlipidemia, unspecified: Secondary | ICD-10-CM

## 2020-10-23 DIAGNOSIS — I10 Essential (primary) hypertension: Secondary | ICD-10-CM

## 2020-10-23 MED ORDER — LOSARTAN POTASSIUM 100 MG PO TABS: 100.0000 mg | ORAL_TABLET | Freq: Every day | ORAL | 0 refills | Status: DC

## 2020-10-23 MED ORDER — ATORVASTATIN CALCIUM 20 MG PO TABS: 20.0000 mg | ORAL_TABLET | Freq: Every day | ORAL | 0 refills | Status: DC

## 2020-10-23 MED ORDER — INDAPAMIDE 1.25 MG PO TABS: 1.2500 mg | ORAL_TABLET | Freq: Every day | ORAL | 0 refills | Status: DC

## 2020-10-23 MED ORDER — METOPROLOL SUCCINATE ER 100 MG PO TB24: 100.0000 mg | ORAL_TABLET | Freq: Every day | ORAL | 0 refills | Status: DC

## 2020-10-25 DIAGNOSIS — H40051 Ocular hypertension, right eye: Secondary | ICD-10-CM | POA: Diagnosis not present

## 2020-10-25 DIAGNOSIS — H401111 Primary open-angle glaucoma, right eye, mild stage: Secondary | ICD-10-CM | POA: Diagnosis not present

## 2020-10-25 DIAGNOSIS — H34831 Tributary (branch) retinal vein occlusion, right eye, with macular edema: Secondary | ICD-10-CM | POA: Diagnosis not present

## 2020-10-25 DIAGNOSIS — Z961 Presence of intraocular lens: Secondary | ICD-10-CM | POA: Diagnosis not present

## 2020-11-11 ENCOUNTER — Other Ambulatory Visit: Payer: Self-pay | Admitting: Internal Medicine

## 2020-11-11 DIAGNOSIS — E039 Hypothyroidism, unspecified: Secondary | ICD-10-CM

## 2020-11-27 ENCOUNTER — Encounter: Payer: Self-pay | Admitting: Internal Medicine

## 2020-11-27 ENCOUNTER — Other Ambulatory Visit: Payer: Self-pay

## 2020-11-27 ENCOUNTER — Ambulatory Visit (INDEPENDENT_AMBULATORY_CARE_PROVIDER_SITE_OTHER): Payer: Medicare Other | Admitting: Internal Medicine

## 2020-11-27 VITALS — BP 132/82 | HR 61 | Temp 98.0°F | Resp 16 | Wt 200.4 lb

## 2020-11-27 DIAGNOSIS — R7303 Prediabetes: Secondary | ICD-10-CM

## 2020-11-27 DIAGNOSIS — E039 Hypothyroidism, unspecified: Secondary | ICD-10-CM

## 2020-11-27 DIAGNOSIS — I10 Essential (primary) hypertension: Secondary | ICD-10-CM

## 2020-11-27 DIAGNOSIS — Z23 Encounter for immunization: Secondary | ICD-10-CM | POA: Diagnosis not present

## 2020-11-27 LAB — TSH: TSH: 5.19 u[IU]/mL (ref 0.35–5.50)

## 2020-11-27 LAB — HEMOGLOBIN A1C: Hgb A1c MFr Bld: 6.2 % (ref 4.6–6.5)

## 2020-11-27 NOTE — Progress Notes (Signed)
Subjective:  Patient ID: Antonio Acosta, male    DOB: 01-Dec-1950  Age: 70 y.o. MRN: [id number]  CC: Hypertension and Hypothyroidism  This visit occurred during the SARS-CoV-2 public health emergency.  Safety protocols were in place, including screening questions prior to the visit, additional usage of staff PPE, and extensive cleaning of exam room while observing appropriate contact time as indicated for disinfecting solutions.    HPI  Antonio Acosta presents for f/up -  He is active and denies chest pain, shortness of breath, diaphoresis, dizziness, lightheadedness, headache, or edema.  Outpatient Medications Prior to Visit  Medication Sig Dispense Refill   ALREX 0.2 % SUSP Eye drops for retina/pressure     aspirin 81 MG tablet Take 1 tablet (81 mg total) by mouth daily. 90 tablet 1   atorvastatin (LIPITOR) 20 MG tablet Take 1 tablet (20 mg total) by mouth daily. 90 tablet 0   Cholecalciferol (VITAMIN D PO) Take 1 capsule by mouth daily.     dorzolamide-timolol (COSOPT) 22.3-6.8 MG/ML ophthalmic solution INSTILL 1 DROP INTO BOTH EYES TWICE A DAY     indapamide (LOZOL) 1.25 MG tablet Take 1 tablet (1.25 mg total) by mouth daily. 90 tablet 0   losartan (COZAAR) 100 MG tablet Take 1 tablet (100 mg total) by mouth daily. 90 tablet 0   metoprolol succinate (TOPROL-XL) 100 MG 24 hr tablet Take 1 tablet (100 mg total) by mouth daily. TAKE WITH OR IMMEDIATELY FOLLOWING A MEAL. 90 tablet 0   Vitamins/Minerals TABS Take by mouth.     SYNTHROID 50 MCG tablet TAKE 1 TABLET (50 MCG TOTAL) DAILY BEFORE BREAKFAST FOR HYPOTHYROIDISM 30 tablet 0   brimonidine (ALPHAGAN) 0.2 % ophthalmic solution      No facility-administered medications prior to visit.    ROS Review of Systems  Constitutional:  Negative for diaphoresis and fatigue.  HENT: Negative.    Eyes: Negative.   Respiratory:  Negative for cough, chest tightness, shortness of breath and wheezing.   Cardiovascular:  Negative for chest pain,  palpitations and leg swelling.  Gastrointestinal:  Negative for abdominal pain, constipation, diarrhea, nausea and vomiting.  Endocrine: Negative for cold intolerance and heat intolerance.  Genitourinary: Negative.  Negative for difficulty urinating.  Musculoskeletal: Negative.  Negative for arthralgias and myalgias.  Skin: Negative.  Negative for color change.  Neurological:  Negative for dizziness, weakness and light-headedness.  Hematological:  Negative for adenopathy. Does not bruise/bleed easily.  Psychiatric/Behavioral: Negative.     Objective:  BP 132/82 (BP Location: Left Arm, Patient Position: Sitting, Cuff Size: Large)   Pulse 61   Temp 98 F (36.7 C) (Oral)   Resp 16   Wt 200 lb 6.4 oz (90.9 kg)   SpO2 97%   BMI 27.18 kg/m   BP Readings from Last 3 Encounters:  11/27/20 132/82  02/14/20 (!) 160/92  08/10/19 130/79    Wt Readings from Last 3 Encounters:  11/27/20 200 lb 6.4 oz (90.9 kg)  02/14/20 205 lb (93 kg)  08/10/19 206 lb (93.4 kg)    Physical Exam Vitals reviewed.  HENT:     Nose: Nose normal.     Mouth/Throat:     Mouth: Mucous membranes are moist.  Eyes:     General: No scleral icterus.    Conjunctiva/sclera: Conjunctivae normal.  Cardiovascular:     Rate and Rhythm: Normal rate and regular rhythm.     Heart sounds: No murmur heard. Pulmonary:     Effort: Pulmonary effort is normal.  Breath sounds: No stridor. No wheezing, rhonchi or rales.  Abdominal:     General: Abdomen is flat.     Tenderness: There is no abdominal tenderness. There is no guarding or rebound.     Hernia: No hernia is present.  Musculoskeletal:        General: Normal range of motion.     Cervical back: Neck supple.     Right lower leg: No edema.     Left lower leg: No edema.  Lymphadenopathy:     Cervical: No cervical adenopathy.  Skin:    General: Skin is warm and dry.  Neurological:     General: No focal deficit present.     Mental Status: He is alert.   Psychiatric:        Mood and Affect: Mood normal.        Behavior: Behavior normal.    Lab Results  Component Value Date   WBC 7.3 02/14/2020   HGB 13.6 02/14/2020   HCT 40.4 02/14/2020   PLT 259.0 02/14/2020   GLUCOSE 108 (H) 11/27/2020   CHOL 165 02/14/2020   TRIG 144.0 02/14/2020   HDL 51.10 02/14/2020   LDLDIRECT 137.6 09/11/2012   LDLCALC 85 02/14/2020   ALT 31 02/14/2020   AST 30 02/14/2020   NA 135 11/27/2020   K 4.0 11/27/2020   CL 99 11/27/2020   CREATININE 1.08 11/27/2020   BUN 18 11/27/2020   CO2 27 11/27/2020   TSH 5.19 11/27/2020   PSA 1.27 02/14/2020   INR 0.94 12/28/2011   HGBA1C 6.2 11/27/2020    No results found.  Assessment & Plan:   Zephyr was seen today for hypertension and hypothyroidism.  Diagnoses and all orders for this visit:  Acquired hypothyroidism- His TSH is in the normal range.  He will stay on the current dose of T4. -     TSH; Future -     TSH -     levothyroxine (SYNTHROID) 50 MCG tablet; TAKE 1 TABLET (50 MCG TOTAL) DAILY BEFORE BREAKFAST FOR HYPOTHYROIDISM  Essential hypertension, benign- His blood pressure is adequately well controlled. -     Basic metabolic panel; Future -     Basic metabolic panel  Prediabetes- His A1c is at 6.2%.  Medical therapy is not yet indicated. -     Basic metabolic panel; Future -     Hemoglobin A1c; Future -     Hemoglobin A1c -     Basic metabolic panel  Need for influenza vaccination -     Flu Vaccine QUAD High Dose(Fluad)  Need for pneumococcal vaccination -     Pneumococcal polysaccharide vaccine 23-valent greater than or equal to 2yo subcutaneous/IM  I have discontinued Rishard Nestler's brimonidine. I have also changed his Synthroid to levothyroxine. Additionally, I am having him maintain his aspirin, Cholecalciferol (VITAMIN D PO), dorzolamide-timolol, Alrex, Vitamins/Minerals, atorvastatin, indapamide, losartan, and metoprolol succinate.  Meds ordered this encounter  Medications    levothyroxine (SYNTHROID) 50 MCG tablet    Sig: TAKE 1 TABLET (50 MCG TOTAL) DAILY BEFORE BREAKFAST FOR HYPOTHYROIDISM    Dispense:  90 tablet    Refill:  1      Follow-up: Return in about 6 months (around 05/27/2021).  Scarlette Calico, MD

## 2020-11-27 NOTE — Patient Instructions (Signed)

## 2020-11-28 LAB — BASIC METABOLIC PANEL
BUN: 18 mg/dL (ref 6–23)
CO2: 27 mEq/L (ref 19–32)
Calcium: 9.5 mg/dL (ref 8.4–10.5)
Chloride: 99 mEq/L (ref 96–112)
Creatinine, Ser: 1.08 mg/dL (ref 0.40–1.50)
GFR: 70.62 mL/min (ref >60.00)
Glucose, Bld: 108 mg/dL — ABNORMAL HIGH (ref 70–99)
Potassium: 4 mEq/L (ref 3.5–5.1)
Sodium: 135 mEq/L (ref 135–145)

## 2020-11-28 MED ORDER — LEVOTHYROXINE SODIUM 50 MCG PO TABS: ORAL_TABLET | ORAL | 1 refills | Status: DC

## 2020-12-21 ENCOUNTER — Other Ambulatory Visit: Payer: Self-pay

## 2020-12-21 ENCOUNTER — Encounter (INDEPENDENT_AMBULATORY_CARE_PROVIDER_SITE_OTHER): Payer: Medicare Other | Admitting: Ophthalmology

## 2020-12-21 DIAGNOSIS — D3132 Benign neoplasm of left choroid: Secondary | ICD-10-CM | POA: Diagnosis not present

## 2020-12-21 DIAGNOSIS — H34831 Tributary (branch) retinal vein occlusion, right eye, with macular edema: Secondary | ICD-10-CM

## 2020-12-21 DIAGNOSIS — I10 Essential (primary) hypertension: Secondary | ICD-10-CM | POA: Diagnosis not present

## 2020-12-21 DIAGNOSIS — H2512 Age-related nuclear cataract, left eye: Secondary | ICD-10-CM

## 2020-12-21 DIAGNOSIS — H35033 Hypertensive retinopathy, bilateral: Secondary | ICD-10-CM

## 2020-12-21 DIAGNOSIS — H43813 Vitreous degeneration, bilateral: Secondary | ICD-10-CM

## 2020-12-25 DIAGNOSIS — H40022 Open angle with borderline findings, high risk, left eye: Secondary | ICD-10-CM | POA: Diagnosis not present

## 2020-12-25 DIAGNOSIS — H401111 Primary open-angle glaucoma, right eye, mild stage: Secondary | ICD-10-CM | POA: Diagnosis not present

## 2020-12-25 DIAGNOSIS — H40032 Anatomical narrow angle, left eye: Secondary | ICD-10-CM | POA: Diagnosis not present

## 2021-01-02 DIAGNOSIS — H40032 Anatomical narrow angle, left eye: Secondary | ICD-10-CM | POA: Diagnosis not present

## 2021-01-17 DIAGNOSIS — H40032 Anatomical narrow angle, left eye: Secondary | ICD-10-CM | POA: Diagnosis not present

## 2021-01-17 DIAGNOSIS — H401111 Primary open-angle glaucoma, right eye, mild stage: Secondary | ICD-10-CM | POA: Diagnosis not present

## 2021-01-17 DIAGNOSIS — H40022 Open angle with borderline findings, high risk, left eye: Secondary | ICD-10-CM | POA: Diagnosis not present

## 2021-01-18 ENCOUNTER — Other Ambulatory Visit: Payer: Self-pay | Admitting: Internal Medicine

## 2021-01-18 DIAGNOSIS — I10 Essential (primary) hypertension: Secondary | ICD-10-CM

## 2021-01-22 ENCOUNTER — Other Ambulatory Visit: Payer: Self-pay | Admitting: Internal Medicine

## 2021-01-22 DIAGNOSIS — E785 Hyperlipidemia, unspecified: Secondary | ICD-10-CM

## 2021-01-24 ENCOUNTER — Other Ambulatory Visit: Payer: Self-pay | Admitting: Internal Medicine

## 2021-01-24 DIAGNOSIS — E039 Hypothyroidism, unspecified: Secondary | ICD-10-CM

## 2021-03-08 ENCOUNTER — Other Ambulatory Visit: Payer: Self-pay

## 2021-03-08 ENCOUNTER — Encounter (INDEPENDENT_AMBULATORY_CARE_PROVIDER_SITE_OTHER): Payer: Medicare Other | Admitting: Ophthalmology

## 2021-03-08 DIAGNOSIS — H43812 Vitreous degeneration, left eye: Secondary | ICD-10-CM

## 2021-03-08 DIAGNOSIS — H34831 Tributary (branch) retinal vein occlusion, right eye, with macular edema: Secondary | ICD-10-CM

## 2021-03-08 DIAGNOSIS — D3132 Benign neoplasm of left choroid: Secondary | ICD-10-CM

## 2021-03-08 DIAGNOSIS — I10 Essential (primary) hypertension: Secondary | ICD-10-CM | POA: Diagnosis not present

## 2021-03-08 DIAGNOSIS — H35033 Hypertensive retinopathy, bilateral: Secondary | ICD-10-CM | POA: Diagnosis not present

## 2021-03-08 DIAGNOSIS — H2512 Age-related nuclear cataract, left eye: Secondary | ICD-10-CM

## 2021-04-10 DIAGNOSIS — H40022 Open angle with borderline findings, high risk, left eye: Secondary | ICD-10-CM | POA: Diagnosis not present

## 2021-04-10 DIAGNOSIS — H401111 Primary open-angle glaucoma, right eye, mild stage: Secondary | ICD-10-CM | POA: Diagnosis not present

## 2021-04-10 DIAGNOSIS — H04123 Dry eye syndrome of bilateral lacrimal glands: Secondary | ICD-10-CM | POA: Diagnosis not present

## 2021-04-10 DIAGNOSIS — H40032 Anatomical narrow angle, left eye: Secondary | ICD-10-CM | POA: Diagnosis not present

## 2021-04-14 ENCOUNTER — Other Ambulatory Visit: Payer: Self-pay | Admitting: Internal Medicine

## 2021-04-14 DIAGNOSIS — E785 Hyperlipidemia, unspecified: Secondary | ICD-10-CM

## 2021-04-15 ENCOUNTER — Other Ambulatory Visit: Payer: Self-pay | Admitting: Internal Medicine

## 2021-04-15 DIAGNOSIS — I10 Essential (primary) hypertension: Secondary | ICD-10-CM

## 2021-05-24 ENCOUNTER — Encounter (INDEPENDENT_AMBULATORY_CARE_PROVIDER_SITE_OTHER): Payer: Medicare Other | Admitting: Ophthalmology

## 2021-05-24 DIAGNOSIS — H43812 Vitreous degeneration, left eye: Secondary | ICD-10-CM

## 2021-05-24 DIAGNOSIS — H34831 Tributary (branch) retinal vein occlusion, right eye, with macular edema: Secondary | ICD-10-CM

## 2021-05-24 DIAGNOSIS — I10 Essential (primary) hypertension: Secondary | ICD-10-CM

## 2021-05-24 DIAGNOSIS — D3132 Benign neoplasm of left choroid: Secondary | ICD-10-CM | POA: Diagnosis not present

## 2021-05-24 DIAGNOSIS — H35033 Hypertensive retinopathy, bilateral: Secondary | ICD-10-CM

## 2021-05-24 DIAGNOSIS — H2512 Age-related nuclear cataract, left eye: Secondary | ICD-10-CM

## 2021-07-09 ENCOUNTER — Other Ambulatory Visit: Payer: Self-pay | Admitting: Internal Medicine

## 2021-07-09 DIAGNOSIS — I10 Essential (primary) hypertension: Secondary | ICD-10-CM

## 2021-07-13 ENCOUNTER — Other Ambulatory Visit: Payer: Self-pay | Admitting: Internal Medicine

## 2021-07-13 DIAGNOSIS — E785 Hyperlipidemia, unspecified: Secondary | ICD-10-CM

## 2021-07-23 ENCOUNTER — Other Ambulatory Visit: Payer: Self-pay | Admitting: Internal Medicine

## 2021-07-23 DIAGNOSIS — E039 Hypothyroidism, unspecified: Secondary | ICD-10-CM

## 2021-07-30 ENCOUNTER — Other Ambulatory Visit: Payer: Self-pay | Admitting: Internal Medicine

## 2021-07-30 ENCOUNTER — Encounter: Payer: Self-pay | Admitting: Internal Medicine

## 2021-07-30 DIAGNOSIS — I10 Essential (primary) hypertension: Secondary | ICD-10-CM

## 2021-07-30 DIAGNOSIS — E785 Hyperlipidemia, unspecified: Secondary | ICD-10-CM

## 2021-08-02 ENCOUNTER — Encounter (INDEPENDENT_AMBULATORY_CARE_PROVIDER_SITE_OTHER): Payer: Medicare Other | Admitting: Ophthalmology

## 2021-08-02 DIAGNOSIS — I10 Essential (primary) hypertension: Secondary | ICD-10-CM

## 2021-08-02 DIAGNOSIS — H35033 Hypertensive retinopathy, bilateral: Secondary | ICD-10-CM

## 2021-08-02 DIAGNOSIS — D3132 Benign neoplasm of left choroid: Secondary | ICD-10-CM | POA: Diagnosis not present

## 2021-08-02 DIAGNOSIS — H34831 Tributary (branch) retinal vein occlusion, right eye, with macular edema: Secondary | ICD-10-CM

## 2021-08-07 ENCOUNTER — Ambulatory Visit (INDEPENDENT_AMBULATORY_CARE_PROVIDER_SITE_OTHER): Payer: Medicare Other | Admitting: Internal Medicine

## 2021-08-07 ENCOUNTER — Encounter: Payer: Self-pay | Admitting: Internal Medicine

## 2021-08-07 VITALS — BP 134/82 | HR 70 | Temp 98.0°F | Ht 72.0 in | Wt 203.0 lb

## 2021-08-07 DIAGNOSIS — I10 Essential (primary) hypertension: Secondary | ICD-10-CM | POA: Diagnosis not present

## 2021-08-07 DIAGNOSIS — N400 Enlarged prostate without lower urinary tract symptoms: Secondary | ICD-10-CM

## 2021-08-07 DIAGNOSIS — E039 Hypothyroidism, unspecified: Secondary | ICD-10-CM | POA: Diagnosis not present

## 2021-08-07 DIAGNOSIS — E785 Hyperlipidemia, unspecified: Secondary | ICD-10-CM

## 2021-08-07 DIAGNOSIS — R7303 Prediabetes: Secondary | ICD-10-CM | POA: Diagnosis not present

## 2021-08-07 DIAGNOSIS — D485 Neoplasm of uncertain behavior of skin: Secondary | ICD-10-CM | POA: Insufficient documentation

## 2021-08-07 LAB — CBC WITH DIFFERENTIAL/PLATELET
Basophils Absolute: 0.1 10*3/uL (ref 0.0–0.1)
Basophils Relative: 0.7 % (ref 0.0–3.0)
Eosinophils Absolute: 0.3 10*3/uL (ref 0.0–0.7)
Eosinophils Relative: 3.5 % (ref 0.0–5.0)
HCT: 39.2 % (ref 39.0–52.0)
Hemoglobin: 13.4 g/dL (ref 13.0–17.0)
Lymphocytes Relative: 33.1 % (ref 12.0–46.0)
Lymphs Abs: 2.6 10*3/uL (ref 0.7–4.0)
MCHC: 34.1 g/dL (ref 30.0–36.0)
MCV: 84.8 fl (ref 78.0–100.0)
Monocytes Absolute: 0.7 10*3/uL (ref 0.1–1.0)
Monocytes Relative: 9.1 % (ref 3.0–12.0)
Neutro Abs: 4.2 10*3/uL (ref 1.4–7.7)
Neutrophils Relative %: 53.6 % (ref 43.0–77.0)
Platelets: 259 10*3/uL (ref 150.0–400.0)
RBC: 4.63 Mil/uL (ref 4.22–5.81)
RDW: 13.3 % (ref 11.5–15.5)
WBC: 7.9 10*3/uL (ref 4.0–10.5)

## 2021-08-07 LAB — LIPID PANEL
Cholesterol: 174 mg/dL (ref 0–200)
HDL: 54.7 mg/dL (ref >39.00)
NonHDL: 119.1
Total CHOL/HDL Ratio: 3
Triglycerides: 244 mg/dL — ABNORMAL HIGH (ref 0.0–149.0)
VLDL: 48.8 mg/dL — ABNORMAL HIGH (ref 0.0–40.0)

## 2021-08-07 LAB — HEPATIC FUNCTION PANEL
ALT: 28 U/L (ref 0–53)
AST: 30 U/L (ref 0–37)
Albumin: 4.8 g/dL (ref 3.5–5.2)
Alkaline Phosphatase: 79 U/L (ref 39–117)
Bilirubin, Direct: 0.2 mg/dL (ref 0.0–0.3)
Total Bilirubin: 1 mg/dL (ref 0.2–1.2)
Total Protein: 7.6 g/dL (ref 6.0–8.3)

## 2021-08-07 LAB — BASIC METABOLIC PANEL
BUN: 21 mg/dL (ref 6–23)
CO2: 27 mEq/L (ref 19–32)
Calcium: 9.8 mg/dL (ref 8.4–10.5)
Chloride: 97 mEq/L (ref 96–112)
Creatinine, Ser: 1.16 mg/dL (ref 0.40–1.50)
GFR: 64.51 mL/min (ref >60.00)
Glucose, Bld: 103 mg/dL — ABNORMAL HIGH (ref 70–99)
Potassium: 4.1 mEq/L (ref 3.5–5.1)
Sodium: 136 mEq/L (ref 135–145)

## 2021-08-07 LAB — HEMOGLOBIN A1C: Hgb A1c MFr Bld: 6.3 % (ref 4.6–6.5)

## 2021-08-07 LAB — TSH: TSH: 4.7 u[IU]/mL (ref 0.35–5.50)

## 2021-08-07 LAB — LDL CHOLESTEROL, DIRECT: Direct LDL: 88 mg/dL

## 2021-08-07 LAB — PSA: PSA: 1.47 ng/mL (ref 0.10–4.00)

## 2021-08-07 MED ORDER — LOSARTAN POTASSIUM 100 MG PO TABS: 100.0000 mg | ORAL_TABLET | Freq: Every day | ORAL | 1 refills | Status: DC

## 2021-08-07 MED ORDER — INDAPAMIDE 1.25 MG PO TABS: 1.2500 mg | ORAL_TABLET | Freq: Every day | ORAL | 1 refills | Status: DC

## 2021-08-07 MED ORDER — LEVOTHYROXINE SODIUM 50 MCG PO TABS: 50.0000 ug | ORAL_TABLET | Freq: Every day | ORAL | 1 refills | Status: DC

## 2021-08-07 MED ORDER — METOPROLOL SUCCINATE ER 100 MG PO TB24: ORAL_TABLET | ORAL | 1 refills | Status: DC

## 2021-08-07 MED ORDER — ATORVASTATIN CALCIUM 20 MG PO TABS: 20.0000 mg | ORAL_TABLET | Freq: Every day | ORAL | 1 refills | Status: DC

## 2021-08-07 NOTE — Patient Instructions (Signed)
Hypertension, Adult High blood pressure (hypertension) is when the force of blood pumping through the arteries is too strong. The arteries are the blood vessels that carry blood from the heart throughout the body. Hypertension forces the heart to work harder to pump blood and may cause arteries to become narrow or stiff. Untreated or uncontrolled hypertension can lead to a heart attack, heart failure, a stroke, kidney disease, and other problems. A blood pressure reading consists of a higher number over a lower number. Ideally, your blood pressure should be below 120/80. The first ("top") number is called the systolic pressure. It is a measure of the pressure in your arteries as your heart beats. The second ("bottom") number is called the diastolic pressure. It is a measure of the pressure in your arteries as the heart relaxes. What are the causes? The exact cause of this condition is not known. There are some conditions that result in high blood pressure. What increases the risk? Certain factors may make you more likely to develop high blood pressure. Some of these risk factors are under your control, including: Smoking. Not getting enough exercise or physical activity. Being overweight. Having too much fat, sugar, calories, or salt (sodium) in your diet. Drinking too much alcohol. Other risk factors include: Having a personal history of heart disease, diabetes, high cholesterol, or kidney disease. Stress. Having a family history of high blood pressure and high cholesterol. Having obstructive sleep apnea. Age. The risk increases with age. What are the signs or symptoms? High blood pressure may not cause symptoms. Very high blood pressure (hypertensive crisis) may cause: Headache. Fast or irregular heartbeats (palpitations). Shortness of breath. Nosebleed. Nausea and vomiting. Vision changes. Severe chest pain, dizziness, and seizures. How is this diagnosed? This condition is diagnosed by  measuring your blood pressure while you are seated, with your arm resting on a flat surface, your legs uncrossed, and your feet flat on the floor. The cuff of the blood pressure monitor will be placed directly against the skin of your upper arm at the level of your heart. Blood pressure should be measured at least twice using the same arm. Certain conditions can cause a difference in blood pressure between your right and left arms. If you have a high blood pressure reading during one visit or you have normal blood pressure with other risk factors, you may be asked to: Return on a different day to have your blood pressure checked again. Monitor your blood pressure at home for 1 week or longer. If you are diagnosed with hypertension, you may have other blood or imaging tests to help your health care provider understand your overall risk for other conditions. How is this treated? This condition is treated by making healthy lifestyle changes, such as eating healthy foods, exercising more, and reducing your alcohol intake. You may be referred for counseling on a healthy diet and physical activity. Your health care provider may prescribe medicine if lifestyle changes are not enough to get your blood pressure under control and if: Your systolic blood pressure is above 130. Your diastolic blood pressure is above 80. Your personal target blood pressure may vary depending on your medical conditions, your age, and other factors. Follow these instructions at home: Eating and drinking  Eat a diet that is high in fiber and potassium, and low in sodium, added sugar, and fat. An example of this eating plan is called the DASH diet. DASH stands for Dietary Approaches to Stop Hypertension. To eat this way: Eat   plenty of fresh fruits and vegetables. Try to fill one half of your plate at each meal with fruits and vegetables. Eat whole grains, such as whole-wheat pasta, brown rice, or whole-grain bread. Fill about one  fourth of your plate with whole grains. Eat or drink low-fat dairy products, such as skim milk or low-fat yogurt. Avoid fatty cuts of meat, processed or cured meats, and poultry with skin. Fill about one fourth of your plate with lean proteins, such as fish, chicken without skin, beans, eggs, or tofu. Avoid pre-made and processed foods. These tend to be higher in sodium, added sugar, and fat. Reduce your daily sodium intake. Many people with hypertension should eat less than 1,500 mg of sodium a day. Do not drink alcohol if: Your health care provider tells you not to drink. You are pregnant, may be pregnant, or are planning to become pregnant. If you drink alcohol: Limit how much you have to: 0-1 drink a day for women. 0-2 drinks a day for men. Know how much alcohol is in your drink. In the [place] one drink equals one 12 oz bottle of beer (355 mL), one 5 oz glass of wine (148 mL), or one 1 oz glass of hard liquor (44 mL). Lifestyle  Work with your health care provider to maintain a healthy body weight or to lose weight. Ask what an ideal weight is for you. Get at least 30 minutes of exercise that causes your heart to beat faster (aerobic exercise) most days of the week. Activities may include walking, swimming, or biking. Include exercise to strengthen your muscles (resistance exercise), such as Pilates or lifting weights, as part of your weekly exercise routine. Try to do these types of exercises for 30 minutes at least 3 days a week. Do not use any products that contain nicotine or tobacco. These products include cigarettes, chewing tobacco, and vaping devices, such as e-cigarettes. If you need help quitting, ask your health care provider. Monitor your blood pressure at home as told by your health care provider. Keep all follow-up visits. This is important. Medicines Take over-the-counter and prescription medicines only as told by your health care provider. Follow directions carefully. Blood  pressure medicines must be taken as prescribed. Do not skip doses of blood pressure medicine. Doing this puts you at risk for problems and can make the medicine less effective. Ask your health care provider about side effects or reactions to medicines that you should watch for. Contact a health care provider if you: Think you are having a reaction to a medicine you are taking. Have headaches that keep coming back (recurring). Feel dizzy. Have swelling in your ankles. Have trouble with your vision. Get help right away if you: Develop a severe headache or confusion. Have unusual weakness or numbness. Feel faint. Have severe pain in your chest or abdomen. Vomit repeatedly. Have trouble breathing. These symptoms may be an emergency. Get help right away. Call 911. Do not wait to see if the symptoms will go away. Do not drive yourself to the hospital. Summary Hypertension is when the force of blood pumping through your arteries is too strong. If this condition is not controlled, it may put you at risk for serious complications. Your personal target blood pressure may vary depending on your medical conditions, your age, and other factors. For most people, a normal blood pressure is less than 120/80. Hypertension is treated with lifestyle changes, medicines, or a combination of both. Lifestyle changes include losing weight, eating a healthy,   low-sodium diet, exercising more, and limiting alcohol. This information is not intended to replace advice given to you by your health care provider. Make sure you discuss any questions you have with your health care provider. Document Revised: 10/31/2020 Document Reviewed: 10/31/2020 Elsevier Patient Education  2023 [employer]  

## 2021-08-07 NOTE — Progress Notes (Signed)
Subjective:  Patient ID: Antonio Acosta, male    DOB: March 29, 1950  Age: 71 y.o. MRN: [id number]  CC: Hypertension, Hyperlipidemia, and Hypothyroidism   HPI Antonio Acosta presents for f/up -   He walks about 5 miles a day and does not experience chest pain, shortness of breath, diaphoresis, or edema.  Outpatient Medications Prior to Visit  Medication Sig Dispense Refill   ALREX 0.2 % SUSP Eye drops for retina/pressure     aspirin 81 MG tablet Take 1 tablet (81 mg total) by mouth daily. 90 tablet 1   bimatoprost (LUMIGAN) 0.03 % ophthalmic solution 1 drop at bedtime.     Cholecalciferol (VITAMIN D PO) Take 1 capsule by mouth daily.     dorzolamide-timolol (COSOPT) 22.3-6.8 MG/ML ophthalmic solution INSTILL 1 DROP INTO BOTH EYES TWICE A DAY     Vitamins/Minerals TABS Take by mouth.     atorvastatin (LIPITOR) 20 MG tablet TAKE 1 TABLET BY MOUTH EVERY DAY 90 tablet 0   indapamide (LOZOL) 1.25 MG tablet TAKE 1 TABLET BY MOUTH EVERY DAY 90 tablet 0   levothyroxine (SYNTHROID) 50 MCG tablet Take 1 tablet (50 mcg total) by mouth daily before breakfast. TAKE 1 TABLET DAILY BEFORE BREAKFAST FOR HYPOTHYROIDISM 90 tablet 1   losartan (COZAAR) 100 MG tablet TAKE 1 TABLET BY MOUTH EVERY DAY 90 tablet 0   metoprolol succinate (TOPROL-XL) 100 MG 24 hr tablet TAKE 1 TABLET BY MOUTH EVERY DAY WITH OR IMMEDIATELY FOLLOWING A MEAL 90 tablet 0   No facility-administered medications prior to visit.    ROS Review of Systems  Constitutional:  Negative for diaphoresis, fatigue and unexpected weight change.  HENT: Negative.    Eyes: Negative.   Respiratory:  Negative for cough, chest tightness, shortness of breath and wheezing.   Cardiovascular:  Negative for chest pain, palpitations and leg swelling.  Gastrointestinal:  Negative for abdominal pain, constipation, diarrhea, nausea and vomiting.  Endocrine: Negative.  Negative for cold intolerance and heat intolerance.  Genitourinary: Negative.  Negative for  difficulty urinating.  Musculoskeletal: Negative.  Negative for arthralgias and myalgias.  Skin: Negative.  Negative for color change.  Neurological: Negative.   Hematological:  Negative for adenopathy. Does not bruise/bleed easily.  Psychiatric/Behavioral: Negative.      Objective:  BP 134/82 (BP Location: Right Arm, Patient Position: Sitting, Cuff Size: Large)   Pulse 70   Temp 98 F (36.7 C) (Oral)   Ht 6' (1.829 m)   Wt 203 lb (92.1 kg)   SpO2 98%   BMI 27.53 kg/m   BP Readings from Last 3 Encounters:  08/07/21 134/82  11/27/20 132/82  02/14/20 (!) 160/92    Wt Readings from Last 3 Encounters:  08/07/21 203 lb (92.1 kg)  11/27/20 200 lb 6.4 oz (90.9 kg)  02/14/20 205 lb (93 kg)    Physical Exam Vitals reviewed.  HENT:     Nose: Nose normal.     Mouth/Throat:     Mouth: Mucous membranes are moist.  Eyes:     General: No scleral icterus.    Conjunctiva/sclera: Conjunctivae normal.  Cardiovascular:     Rate and Rhythm: Normal rate and regular rhythm.     Heart sounds: No murmur heard. Pulmonary:     Effort: Pulmonary effort is normal.     Breath sounds: No stridor. No wheezing, rhonchi or rales.  Abdominal:     General: Abdomen is flat.     Palpations: There is no mass.     Tenderness:  There is no abdominal tenderness. There is no guarding.     Hernia: No hernia is present.  Musculoskeletal:        General: Normal range of motion.     Cervical back: Neck supple.     Right lower leg: No edema.     Left lower leg: No edema.  Lymphadenopathy:     Cervical: No cervical adenopathy.  Skin:    General: Skin is warm and dry.  Neurological:     General: No focal deficit present.     Mental Status: He is alert.  Psychiatric:        Mood and Affect: Mood normal.        Behavior: Behavior normal.     Lab Results  Component Value Date   WBC 7.9 08/07/2021   HGB 13.4 08/07/2021   HCT 39.2 08/07/2021   PLT 259.0 08/07/2021   GLUCOSE 103 (H) 08/07/2021    CHOL 174 08/07/2021   TRIG 244.0 (H) 08/07/2021   HDL 54.70 08/07/2021   LDLDIRECT 88.0 08/07/2021   LDLCALC 85 02/14/2020   ALT 28 08/07/2021   AST 30 08/07/2021   NA 136 08/07/2021   K 4.1 08/07/2021   CL 97 08/07/2021   CREATININE 1.16 08/07/2021   BUN 21 08/07/2021   CO2 27 08/07/2021   TSH 4.70 08/07/2021   PSA 1.47 08/07/2021   INR 0.94 12/28/2011   HGBA1C 6.3 08/07/2021    No results found.  Assessment & Plan:   Antonio Acosta was seen today for hypertension, hyperlipidemia and hypothyroidism.  Diagnoses and all orders for this visit:  Acquired hypothyroidism- He is euthyroid. -     TSH; Future -     TSH -     levothyroxine (SYNTHROID) 50 MCG tablet; Take 1 tablet (50 mcg total) by mouth daily before breakfast. TAKE 1 TABLET DAILY BEFORE BREAKFAST FOR HYPOTHYROIDISM  Essential hypertension, benign- His blood pressure is well controlled. -     Basic metabolic panel; Future -     CBC with Differential/Platelet; Future -     Hepatic function panel; Future -     Basic metabolic panel -     CBC with Differential/Platelet -     Hepatic function panel -     losartan (COZAAR) 100 MG tablet; Take 1 tablet (100 mg total) by mouth daily. -     indapamide (LOZOL) 1.25 MG tablet; Take 1 tablet (1.25 mg total) by mouth daily. -     metoprolol succinate (TOPROL-XL) 100 MG 24 hr tablet; TAKE 1 TABLET BY MOUTH EVERY DAY WITH OR IMMEDIATELY FOLLOWING A MEAL  Benign prostatic hyperplasia without lower urinary tract symptoms- His PSA is normal -     PSA; Future -     PSA  Prediabetes-A1c is up to 6.3%.  He will continue working on his lifestyle modifications. -     Basic metabolic panel; Future -     Hemoglobin A1c; Future -     Basic metabolic panel -     Hemoglobin A1c  Hyperlipidemia with target LDL less than 130- LDL goal achieved. Doing well on the statin  -     Lipid panel; Future -     Hepatic function panel; Future -     Lipid panel -     Hepatic function panel -      atorvastatin (LIPITOR) 20 MG tablet; Take 1 tablet (20 mg total) by mouth daily.  Other orders -     LDL cholesterol,  direct   I have changed Antonio Acosta's atorvastatin, losartan, and indapamide. I am also having him maintain his aspirin, Cholecalciferol (VITAMIN D PO), dorzolamide-timolol, Alrex, Vitamins/Minerals, bimatoprost, levothyroxine, and metoprolol succinate.  Meds ordered this encounter  Medications   atorvastatin (LIPITOR) 20 MG tablet    Sig: Take 1 tablet (20 mg total) by mouth daily.    Dispense:  90 tablet    Refill:  1   losartan (COZAAR) 100 MG tablet    Sig: Take 1 tablet (100 mg total) by mouth daily.    Dispense:  90 tablet    Refill:  1   indapamide (LOZOL) 1.25 MG tablet    Sig: Take 1 tablet (1.25 mg total) by mouth daily.    Dispense:  90 tablet    Refill:  1   levothyroxine (SYNTHROID) 50 MCG tablet    Sig: Take 1 tablet (50 mcg total) by mouth daily before breakfast. TAKE 1 TABLET DAILY BEFORE BREAKFAST FOR HYPOTHYROIDISM    Dispense:  90 tablet    Refill:  1   metoprolol succinate (TOPROL-XL) 100 MG 24 hr tablet    Sig: TAKE 1 TABLET BY MOUTH EVERY DAY WITH OR IMMEDIATELY FOLLOWING A MEAL    Dispense:  90 tablet    Refill:  1     Follow-up: Return in about 6 months (around 02/07/2022).  Scarlette Calico, MD

## 2021-08-28 ENCOUNTER — Telehealth: Payer: Self-pay | Admitting: Internal Medicine

## 2021-08-28 NOTE — Telephone Encounter (Signed)
LVM for pt to rtn my call to schedule AWV with [facility] call back # [phone] 

## 2021-08-30 ENCOUNTER — Telehealth: Payer: Self-pay | Admitting: Internal Medicine

## 2021-08-30 NOTE — Telephone Encounter (Signed)
LVM for pt to rtn my call to schedule AWV with [facility] call back # [phone] 

## 2021-10-03 DIAGNOSIS — Z23 Encounter for immunization: Secondary | ICD-10-CM | POA: Diagnosis not present

## 2021-10-11 DIAGNOSIS — H40022 Open angle with borderline findings, high risk, left eye: Secondary | ICD-10-CM | POA: Diagnosis not present

## 2021-10-11 DIAGNOSIS — D3132 Benign neoplasm of left choroid: Secondary | ICD-10-CM | POA: Diagnosis not present

## 2021-10-11 DIAGNOSIS — H40032 Anatomical narrow angle, left eye: Secondary | ICD-10-CM | POA: Diagnosis not present

## 2021-10-11 DIAGNOSIS — H401111 Primary open-angle glaucoma, right eye, mild stage: Secondary | ICD-10-CM | POA: Diagnosis not present

## 2021-10-18 ENCOUNTER — Encounter (INDEPENDENT_AMBULATORY_CARE_PROVIDER_SITE_OTHER): Payer: Medicare Other | Admitting: Ophthalmology

## 2021-10-18 DIAGNOSIS — D3132 Benign neoplasm of left choroid: Secondary | ICD-10-CM | POA: Diagnosis not present

## 2021-10-18 DIAGNOSIS — H35033 Hypertensive retinopathy, bilateral: Secondary | ICD-10-CM

## 2021-10-18 DIAGNOSIS — I10 Essential (primary) hypertension: Secondary | ICD-10-CM | POA: Diagnosis not present

## 2021-10-18 DIAGNOSIS — H34831 Tributary (branch) retinal vein occlusion, right eye, with macular edema: Secondary | ICD-10-CM

## 2021-10-18 DIAGNOSIS — H43812 Vitreous degeneration, left eye: Secondary | ICD-10-CM | POA: Diagnosis not present

## 2021-11-02 DIAGNOSIS — H40022 Open angle with borderline findings, high risk, left eye: Secondary | ICD-10-CM | POA: Diagnosis not present

## 2021-11-02 DIAGNOSIS — H401111 Primary open-angle glaucoma, right eye, mild stage: Secondary | ICD-10-CM | POA: Diagnosis not present

## 2021-11-02 DIAGNOSIS — H40032 Anatomical narrow angle, left eye: Secondary | ICD-10-CM | POA: Diagnosis not present

## 2021-12-27 ENCOUNTER — Encounter (INDEPENDENT_AMBULATORY_CARE_PROVIDER_SITE_OTHER): Payer: Medicare Other | Admitting: Ophthalmology

## 2021-12-27 DIAGNOSIS — H43812 Vitreous degeneration, left eye: Secondary | ICD-10-CM

## 2021-12-27 DIAGNOSIS — D3132 Benign neoplasm of left choroid: Secondary | ICD-10-CM

## 2021-12-27 DIAGNOSIS — H34831 Tributary (branch) retinal vein occlusion, right eye, with macular edema: Secondary | ICD-10-CM | POA: Diagnosis not present

## 2021-12-27 DIAGNOSIS — H35033 Hypertensive retinopathy, bilateral: Secondary | ICD-10-CM | POA: Diagnosis not present

## 2021-12-27 DIAGNOSIS — I10 Essential (primary) hypertension: Secondary | ICD-10-CM

## 2022-01-29 ENCOUNTER — Ambulatory Visit
Admission: RE | Admit: 2022-01-29 | Discharge: 2022-01-29 | Disposition: A | Discharge status: Home / Self Care | Payer: Medicare Other | Source: Ambulatory Visit | Attending: Urgent Care | Admitting: Urgent Care

## 2022-01-29 ENCOUNTER — Ambulatory Visit (INDEPENDENT_AMBULATORY_CARE_PROVIDER_SITE_OTHER): Payer: Medicare Other

## 2022-01-29 VITALS — BP 163/95 | HR 133 | Temp 98.4°F | Resp 17

## 2022-01-29 DIAGNOSIS — J22 Unspecified acute lower respiratory infection: Secondary | ICD-10-CM | POA: Diagnosis not present

## 2022-01-29 DIAGNOSIS — R509 Fever, unspecified: Secondary | ICD-10-CM

## 2022-01-29 DIAGNOSIS — R059 Cough, unspecified: Secondary | ICD-10-CM | POA: Diagnosis not present

## 2022-01-29 MED ORDER — GUAIFENESIN ER 600 MG PO TB12: 600.0000 mg | ORAL_TABLET | Freq: Two times a day (BID) | ORAL | 0 refills | Status: DC | PRN

## 2022-01-29 MED ORDER — AZITHROMYCIN 250 MG PO TABS: 250.0000 mg | ORAL_TABLET | Freq: Every day | ORAL | 0 refills | Status: DC

## 2022-01-29 NOTE — ED Triage Notes (Signed)
Pt c/o "hacking" cough, headache, fatigue since Thursday. Taking dayquil and nyquil prn. COVID test neg at home yesterday.

## 2022-01-29 NOTE — ED Provider Notes (Signed)
[facility]    CSN: [id number] Arrival date & time: 01/29/22  0805      History   Chief Complaint Chief Complaint  Patient presents with   Cough    APPT 815AM   Headache   Fatigue    HPI Antonio Acosta is a 72 y.o. male.   72 year old male presents today due to concerns of a harsh, deep, nonproductive cough.  States it sounds like it is "rattling around", but he is unable to bring up the phlegm.  States in the past several weeks he has been in and out of airports and airplanes, traveling across the country.  Reports on Thursday of last week, he was hit suddenly with a cough, headache, fatigue, and fever.  States Tmax has been 100.3.  He has been alternating DayQuil and NyQuil, but does not like taking it as he states it makes him feel "loopy in the head".  Patient has taken 2 home COVID tests in the past 48 hours, both of which were negative.  He denies earache or sinus pain.  Minimally scratchy throat.  Decreased appetite, but no nausea, vomiting, diarrhea.  No rash.  He has been vaccinated against pneumonia.  Smokes an occasional cigar, but no routine smoking history.  Denies any chronic pulmonary disease. Had fever this am upon awakening.   Cough Associated symptoms: headaches   Headache Associated symptoms: cough     Past Medical History:  Diagnosis Date   Cataract    MD just currently watching   Glaucoma    Heart murmur    full w/u with negative findings approx 12 - 15 yrs ago   High cholesterol    Hypertension    Substance abuse ([place])    Thyroid disease     Patient Active Problem List   Diagnosis Date Noted   Neoplasm of uncertain behavior of skin 08/07/2021   Benign prostatic hyperplasia without lower urinary tract symptoms 02/12/2019   Acquired hypothyroidism 02/12/2019   Polyp of colon 07/24/2016   Hyperlipidemia with target LDL less than 130 07/27/2015   Prediabetes 09/11/2012   Essential hypertension, benign 05/11/2012   Alcohol abuse, in  remission 05/11/2012    Past Surgical History:  Procedure Laterality Date   AIR/FLUID EXCHANGE Right 10/14/2017   Procedure: RIGHT AIR/FLUID EXCHANGE;  Surgeon: Hayden Pedro, MD;  Location: [facility];  Service: Ophthalmology;  Laterality: Right;   APPENDECTOMY     CATARACT EXTRACTION W/ INTRAOCULAR LENS IMPLANT Right 09/11/2017   COLONOSCOPY  12/24/2012   Henrene Pastor - Tics, 4 polyps SSA   COLONOSCOPY W/ POLYPECTOMY     EYE SURGERY     laser   LAPAROSCOPIC APPENDECTOMY N/A 05/25/2014   Procedure: APPENDECTOMY LAPAROSCOPIC;  Surgeon: Autumn Messing III, MD;  Location: [facility];  Service: General;  Laterality: N/A;   LASER PHOTO ABLATION Right 10/14/2017   Procedure: LASER PHOTO ABLATION RIGHT EYE;  Surgeon: Hayden Pedro, MD;  Location: [facility];  Service: Ophthalmology;  Laterality: Right;   MEMBRANE PEEL Right 10/14/2017   Procedure: RIGHT MEMBRANE PEEL;  Surgeon: Hayden Pedro, MD;  Location: [facility];  Service: Ophthalmology;  Laterality: Right;   PARS PLANA VITRECTOMY Right 10/14/2017   RIGHT 25G PARS PLANA VITRECTOMY WITH REMOVABLE INTRAOCULAR LENS FROM VITREOUS; PLACEMENT OF SECONDARY INTRAOCULAR LENS WITH SUTURE   PARS PLANA VITRECTOMY Right 10/14/2017   Procedure: RIGHT 25G PARS PLANA VITRECTOMY WITH REMOVABLE INTRAOCULAR LENS FROM VITREOUS; PLACEMENT OF SECONDARY INTRAOCULAR LENS WITH SUTURE;  Surgeon: Tempie Hoist  D, MD;  Location: [place] [facility];  Service: Ophthalmology;  Laterality: Right;   TONSILLECTOMY         Home Medications    Prior to Admission medications   Medication Sig Start Date End Date Taking? Authorizing Provider  azithromycin (ZITHROMAX) 250 MG tablet Take 1 tablet (250 mg total) by mouth daily. Take first 2 tablets together, then 1 every day until finished. 01/29/22  Yes Shellby Schlink L, PA  guaiFENesin (MUCINEX) 600 MG 12 hr tablet Take 1 tablet (600 mg total) by mouth 2 (two) times daily as needed for cough or to loosen phlegm. 01/29/22  Yes Zalman Hull L, PA  ALREX 0.2 %  SUSP Eye drops for retina/pressure 12/17/18   [provider]  aspirin 81 MG tablet Take 1 tablet (81 mg total) by mouth daily. 08/04/17   Janith Lima, MD  atorvastatin (LIPITOR) 20 MG tablet Take 1 tablet (20 mg total) by mouth daily. 08/07/21   Janith Lima, MD  bimatoprost (LUMIGAN) 0.03 % ophthalmic solution 1 drop at bedtime.    [provider]  Cholecalciferol (VITAMIN D PO) Take 1 capsule by mouth daily.    [provider]  dorzolamide-timolol (COSOPT) 22.3-6.8 MG/ML ophthalmic solution INSTILL 1 DROP INTO BOTH EYES TWICE A DAY 10/05/18   [provider]  indapamide (LOZOL) 1.25 MG tablet Take 1 tablet (1.25 mg total) by mouth daily. 08/07/21   Janith Lima, MD  levothyroxine (SYNTHROID) 50 MCG tablet Take 1 tablet (50 mcg total) by mouth daily before breakfast. TAKE 1 TABLET DAILY BEFORE BREAKFAST FOR HYPOTHYROIDISM 08/07/21   Janith Lima, MD  losartan (COZAAR) 100 MG tablet Take 1 tablet (100 mg total) by mouth daily. 08/07/21   Janith Lima, MD  metoprolol succinate (TOPROL-XL) 100 MG 24 hr tablet TAKE 1 TABLET BY MOUTH EVERY DAY WITH OR IMMEDIATELY FOLLOWING A MEAL 08/07/21   Janith Lima, MD  Vitamins/Minerals TABS Take by mouth. 01/06/12   [provider]    Family History Family History  Problem Relation Age of Onset   Hypertension Father    Stroke Mother    Kidney disease Neg Hx    Hyperlipidemia Neg Hx    Heart disease Neg Hx    Early death Neg Hx    Drug abuse Neg Hx    Diabetes Neg Hx    Cancer Neg Hx    Colon cancer Neg Hx    Stomach cancer Neg Hx    Rectal cancer Neg Hx     Social History Social History   Tobacco Use   Smoking status: Former    Types: Cigarettes   Smokeless tobacco: Never   Tobacco comments:    quit at age 41yr  Currently has an occasional cigar  Vaping Use   Vaping Use: Never used  Substance Use Topics   Alcohol use: Not Currently    Alcohol/week: 0.0 standard drinks of alcohol    Drug use: Not Currently    Types: Marijuana    Comment: None in last 20 yrs     Allergies   Patient has no known allergies.   Review of Systems Review of Systems  Respiratory:  Positive for cough.   Neurological:  Positive for headaches.     Physical Exam Triage Vital Signs ED Triage Vitals  Enc Vitals Group     BP 01/29/22 0813 (!) 163/95     Pulse Rate 01/29/22 0813 (!) 133     Resp 01/29/22 0813  17     Temp 01/29/22 0813 98.4 F (36.9 C)     Temp Source 01/29/22 0813 Oral     SpO2 01/29/22 0813 97 %     Weight --      Height --      Head Circumference --      Peak Flow --      Pain Score 01/29/22 0814 0     Pain Loc --      Pain Edu? --      Excl. in [place]? --    No data found.  Updated Vital Signs BP (!) 163/95 (BP Location: Right Arm)   Pulse (!) 133   Temp 98.4 F (36.9 C) (Oral)   Resp 17   SpO2 97%   Visual Acuity Right Eye Distance:   Left Eye Distance:   Bilateral Distance:    Right Eye Near:   Left Eye Near:    Bilateral Near:     Physical Exam Vitals and nursing note reviewed.  Constitutional:      General: He is not in acute distress.    Appearance: He is well-developed. He is ill-appearing.  HENT:     Head: Normocephalic and atraumatic.     Right Ear: Tympanic membrane, ear canal and external ear normal. There is no impacted cerumen.     Left Ear: Tympanic membrane, ear canal and external ear normal. There is no impacted cerumen.     Nose: Nose normal. No congestion or rhinorrhea.     Mouth/Throat:     Mouth: Mucous membranes are moist.     Pharynx: Oropharynx is clear. No oropharyngeal exudate or posterior oropharyngeal erythema.  Eyes:     General: No scleral icterus.       Right eye: No discharge.        Left eye: No discharge.     Extraocular Movements: Extraocular movements intact.     Conjunctiva/sclera: Conjunctivae normal.     Pupils: Pupils are equal, round, and reactive to light.  Cardiovascular:     Rate and Rhythm:  Regular rhythm. Tachycardia present.  Pulmonary:     Effort: Pulmonary effort is normal. No respiratory distress.     Breath sounds: Rhonchi (with fine crackles to RLL posteriorly) present. No wheezing or rales.  Chest:     Chest wall: No tenderness.  Abdominal:     Palpations: Abdomen is soft.     Tenderness: There is no abdominal tenderness.  Musculoskeletal:        General: No swelling.     Cervical back: Normal range of motion and neck supple. No rigidity or tenderness.  Lymphadenopathy:     Cervical: No cervical adenopathy.  Skin:    General: Skin is warm and dry.     Capillary Refill: Capillary refill takes less than 2 seconds.     Findings: No bruising, erythema or rash.  Neurological:     General: No focal deficit present.     Mental Status: He is alert and oriented to person, place, and time.  Psychiatric:        Mood and Affect: Mood normal.      UC Treatments / Results  Labs (all labs ordered are listed, but only abnormal results are displayed) Labs Reviewed - No data to display  EKG   Radiology DG Chest 2 View  Result Date: 01/29/2022 CLINICAL DATA:  Cough, fever. EXAM: CHEST - 2 VIEW COMPARISON:  May 25, 2014. FINDINGS: The heart size and mediastinal contours  are within normal limits. Both lungs are clear. The visualized skeletal structures are unremarkable. IMPRESSION: No active cardiopulmonary disease. Electronically Signed   By: Marijo Conception M.D.   On: 01/29/2022 08:37    Procedures Procedures (including critical care time)  Medications Ordered in UC Medications - No data to display  Initial Impression / Assessment and Plan / UC Course  I have reviewed the triage vital signs and the nursing notes.  Pertinent labs & imaging results that were available during my care of the patient were reviewed by me and considered in my medical decision making (see chart for details).     Lower resp tract infection -given presentation, and lack of consolidation  on chest x-ray, I am concerned about a mycoplasma or an atypical pneumonia.  Will start patient with azithromycin x 5 days, mucinex and humidification. Rest and increase water intake. F/U with PCP if sx remain >10 days Tachycardia - likely secondary to #1. Pt completed two covid tests, both neg. Pt past tx window for flu therefore not tested. Monitor HR at home HTN - take meds as prescribed by PCP. F/U with PCP if remains elevated   Final Clinical Impressions(s) / UC Diagnoses   Final diagnoses:  Acute lower respiratory tract infection     Discharge Instructions      Your chest x-ray is normal.  You have a lower respiratory tract infection. Please start taking the antibiotic, Azithromycin, for 5 days per package instructions.  Hot steam from a shower or vaporizer may also be beneficial to help open up the upper airway. Eucalyptus can be helpful. A warm moist washcloth to the chest may be helpful.  Please alternate ibuprofen and tylenol as needed for fever or aches.  Use mucinex twice daily to help break up the mucous in your chest. Increase your water intake. This is NOT a cough suppressant. During the day we want you to cough up the mucous.  If any worsening symptoms such as continued headache, fever, or shortness of breath, please return for a recheck.     ED Prescriptions     Medication Sig Dispense Auth. Provider   azithromycin (ZITHROMAX) 250 MG tablet Take 1 tablet (250 mg total) by mouth daily. Take first 2 tablets together, then 1 every day until finished. 6 tablet Sama Arauz L, PA   guaiFENesin (MUCINEX) 600 MG 12 hr tablet Take 1 tablet (600 mg total) by mouth 2 (two) times daily as needed for cough or to loosen phlegm. 20 tablet Lattie Cervi L, [place]      PDMP not reviewed this encounter.   Chaney Malling, [place] 01/29/22 [phone]

## 2022-01-29 NOTE — Discharge Instructions (Addendum)
Your chest x-ray is normal.  You have a lower respiratory tract infection. Please start taking the antibiotic, Azithromycin, for 5 days per package instructions.  Hot steam from a shower or vaporizer may also be beneficial to help open up the upper airway. Eucalyptus can be helpful. A warm moist washcloth to the chest may be helpful.  Please alternate ibuprofen and tylenol as needed for fever or aches.  Use mucinex twice daily to help break up the mucous in your chest. Increase your water intake. This is NOT a cough suppressant. During the day we want you to cough up the mucous.  If any worsening symptoms such as continued headache, fever, or shortness of breath, please return for a recheck.

## 2022-01-30 ENCOUNTER — Other Ambulatory Visit: Payer: Self-pay | Admitting: Internal Medicine

## 2022-01-30 ENCOUNTER — Encounter: Payer: Self-pay | Admitting: Internal Medicine

## 2022-01-30 DIAGNOSIS — I10 Essential (primary) hypertension: Secondary | ICD-10-CM

## 2022-01-30 DIAGNOSIS — E785 Hyperlipidemia, unspecified: Secondary | ICD-10-CM

## 2022-01-30 DIAGNOSIS — E039 Hypothyroidism, unspecified: Secondary | ICD-10-CM

## 2022-01-30 MED ORDER — ATORVASTATIN CALCIUM 20 MG PO TABS: 20.0000 mg | ORAL_TABLET | Freq: Every day | ORAL | 0 refills | Status: DC

## 2022-01-30 MED ORDER — LOSARTAN POTASSIUM 100 MG PO TABS: 100.0000 mg | ORAL_TABLET | Freq: Every day | ORAL | 0 refills | Status: DC

## 2022-02-12 ENCOUNTER — Telehealth: Payer: Self-pay

## 2022-02-12 NOTE — Telephone Encounter (Signed)
Left message for patient to call back to schedule Medicare Annual Wellness Visit   Last AWV  09/06/20  Please schedule at anytime with [facility] if patient calls the office back.    30 Minutes appointment   Any questions, please call me at [phone]

## 2022-03-07 ENCOUNTER — Encounter (INDEPENDENT_AMBULATORY_CARE_PROVIDER_SITE_OTHER): Payer: Medicare Other | Admitting: Ophthalmology

## 2022-03-07 DIAGNOSIS — I10 Essential (primary) hypertension: Secondary | ICD-10-CM | POA: Diagnosis not present

## 2022-03-07 DIAGNOSIS — H34831 Tributary (branch) retinal vein occlusion, right eye, with macular edema: Secondary | ICD-10-CM

## 2022-03-07 DIAGNOSIS — H35033 Hypertensive retinopathy, bilateral: Secondary | ICD-10-CM

## 2022-03-07 DIAGNOSIS — H43812 Vitreous degeneration, left eye: Secondary | ICD-10-CM

## 2022-03-07 DIAGNOSIS — D3132 Benign neoplasm of left choroid: Secondary | ICD-10-CM | POA: Diagnosis not present

## 2022-03-12 ENCOUNTER — Ambulatory Visit (INDEPENDENT_AMBULATORY_CARE_PROVIDER_SITE_OTHER): Payer: Medicare Other | Admitting: Internal Medicine

## 2022-03-12 ENCOUNTER — Encounter: Payer: Self-pay | Admitting: Internal Medicine

## 2022-03-12 VITALS — BP 138/78 | HR 65 | Temp 98.3°F | Resp 16 | Ht 72.0 in | Wt 198.0 lb

## 2022-03-12 DIAGNOSIS — R7303 Prediabetes: Secondary | ICD-10-CM

## 2022-03-12 DIAGNOSIS — E039 Hypothyroidism, unspecified: Secondary | ICD-10-CM | POA: Diagnosis not present

## 2022-03-12 DIAGNOSIS — I10 Essential (primary) hypertension: Secondary | ICD-10-CM

## 2022-03-12 DIAGNOSIS — E785 Hyperlipidemia, unspecified: Secondary | ICD-10-CM

## 2022-03-12 LAB — CBC WITH DIFFERENTIAL/PLATELET
Basophils Absolute: 0 10*3/uL (ref 0.0–0.1)
Basophils Relative: 0.6 % (ref 0.0–3.0)
Eosinophils Absolute: 0.4 10*3/uL (ref 0.0–0.7)
Eosinophils Relative: 4.9 % (ref 0.0–5.0)
HCT: 40.2 % (ref 39.0–52.0)
Hemoglobin: 13.5 g/dL (ref 13.0–17.0)
Lymphocytes Relative: 31.6 % (ref 12.0–46.0)
Lymphs Abs: 2.5 10*3/uL (ref 0.7–4.0)
MCHC: 33.6 g/dL (ref 30.0–36.0)
MCV: 85.2 fl (ref 78.0–100.0)
Monocytes Absolute: 0.7 10*3/uL (ref 0.1–1.0)
Monocytes Relative: 9.1 % (ref 3.0–12.0)
Neutro Abs: 4.3 10*3/uL (ref 1.4–7.7)
Neutrophils Relative %: 53.8 % (ref 43.0–77.0)
Platelets: 273 10*3/uL (ref 150.0–400.0)
RBC: 4.72 Mil/uL (ref 4.22–5.81)
RDW: 14 % (ref 11.5–15.5)
WBC: 8 10*3/uL (ref 4.0–10.5)

## 2022-03-12 LAB — BASIC METABOLIC PANEL
BUN: 23 mg/dL (ref 6–23)
CO2: 27 mEq/L (ref 19–32)
Calcium: 9.9 mg/dL (ref 8.4–10.5)
Chloride: 99 mEq/L (ref 96–112)
Creatinine, Ser: 1.08 mg/dL (ref 0.40–1.50)
GFR: 69.99 mL/min (ref >60.00)
Glucose, Bld: 88 mg/dL (ref 70–99)
Potassium: 4 mEq/L (ref 3.5–5.1)
Sodium: 136 mEq/L (ref 135–145)

## 2022-03-12 LAB — HEMOGLOBIN A1C: Hgb A1c MFr Bld: 6.2 % (ref 4.6–6.5)

## 2022-03-12 LAB — TSH: TSH: 5.05 u[IU]/mL (ref 0.35–5.50)

## 2022-03-12 NOTE — Progress Notes (Signed)
Subjective:  Patient ID: Antonio Acosta, male    DOB: 06-Jan-1951  Age: 72 y.o. MRN: [id number]  CC: Hypothyroidism and Hypertension   HPI Antonio Acosta presents for f/up ---  He walks 3 miles per day.  His endurance is good.  He denies chest pain, shortness of breath, diaphoresis, or edema.  Outpatient Medications Prior to Visit  Medication Sig Dispense Refill   ALREX 0.2 % SUSP Eye drops for retina/pressure     aspirin 81 MG tablet Take 1 tablet (81 mg total) by mouth daily. 90 tablet 1   bimatoprost (LUMIGAN) 0.03 % ophthalmic solution 1 drop at bedtime.     Cholecalciferol (VITAMIN D PO) Take 1 capsule by mouth daily.     dorzolamide-timolol (COSOPT) 22.3-6.8 MG/ML ophthalmic solution INSTILL 1 DROP INTO BOTH EYES TWICE A DAY     guaiFENesin (MUCINEX) 600 MG 12 hr tablet Take 1 tablet (600 mg total) by mouth 2 (two) times daily as needed for cough or to loosen phlegm. 20 tablet 0   metoprolol succinate (TOPROL-XL) 100 MG 24 hr tablet TAKE 1 TABLET BY MOUTH EVERY DAY WITH OR IMMEDIATELY FOLLOWING A MEAL 90 tablet 0   Vitamins/Minerals TABS Take by mouth.     atorvastatin (LIPITOR) 20 MG tablet Take 1 tablet (20 mg total) by mouth daily. 90 tablet 0   azithromycin (ZITHROMAX) 250 MG tablet Take 1 tablet (250 mg total) by mouth daily. Take first 2 tablets together, then 1 every day until finished. 6 tablet 0   indapamide (LOZOL) 1.25 MG tablet TAKE 1 TABLET BY MOUTH DAILY. 90 tablet 0   levothyroxine (SYNTHROID) 50 MCG tablet TAKE 1 TABLET DAILY BEFORE BREAKFAST FOR HYPOTHYROIDISM 90 tablet 0   losartan (COZAAR) 100 MG tablet Take 1 tablet (100 mg total) by mouth daily. 90 tablet 0   No facility-administered medications prior to visit.    ROS Review of Systems  Constitutional: Negative.  Negative for chills, diaphoresis, fatigue and unexpected weight change.  HENT: Negative.    Eyes: Negative.   Respiratory:  Negative for cough, chest tightness, shortness of breath and wheezing.    Cardiovascular:  Negative for chest pain, palpitations and leg swelling.  Gastrointestinal:  Negative for abdominal pain, constipation, diarrhea and nausea.  Endocrine: Negative.   Genitourinary: Negative.  Negative for difficulty urinating.  Musculoskeletal: Negative.  Negative for arthralgias and myalgias.  Skin: Negative.   Neurological:  Negative for dizziness, weakness and headaches.  Hematological:  Negative for adenopathy. Does not bruise/bleed easily.  Psychiatric/Behavioral: Negative.      Objective:  BP 138/78 (BP Location: Right Arm, Patient Position: Sitting, Cuff Size: Normal)   Pulse 65   Temp 98.3 F (36.8 C) (Oral)   Resp 16   Ht 6' (1.829 m)   Wt 198 lb (89.8 kg)   SpO2 98%   BMI 26.85 kg/m   BP Readings from Last 3 Encounters:  03/12/22 138/78  01/29/22 (!) 163/95  08/07/21 134/82    Wt Readings from Last 3 Encounters:  03/12/22 198 lb (89.8 kg)  08/07/21 203 lb (92.1 kg)  11/27/20 200 lb 6.4 oz (90.9 kg)    Physical Exam Vitals reviewed.  Constitutional:      Appearance: Normal appearance.  HENT:     Nose: Nose normal.     Mouth/Throat:     Mouth: Mucous membranes are moist.  Eyes:     General: No scleral icterus.    Conjunctiva/sclera: Conjunctivae normal.  Cardiovascular:  Rate and Rhythm: Normal rate and regular rhythm.     Heart sounds: No murmur heard. Pulmonary:     Effort: Pulmonary effort is normal.     Breath sounds: No stridor. No wheezing, rhonchi or rales.  Abdominal:     General: Abdomen is flat.     Palpations: There is no mass.     Tenderness: There is no abdominal tenderness. There is no guarding.     Hernia: No hernia is present.  Musculoskeletal:        General: Normal range of motion.     Cervical back: Neck supple.     Right lower leg: No edema.     Left lower leg: No edema.  Lymphadenopathy:     Cervical: No cervical adenopathy.  Skin:    General: Skin is warm and dry.  Neurological:     General: No focal  deficit present.     Mental Status: He is alert. Mental status is at baseline.  Psychiatric:        Mood and Affect: Mood normal.        Behavior: Behavior normal.     Lab Results  Component Value Date   WBC 8.0 03/12/2022   HGB 13.5 03/12/2022   HCT 40.2 03/12/2022   PLT 273.0 03/12/2022   GLUCOSE 88 03/12/2022   CHOL 174 08/07/2021   TRIG 244.0 (H) 08/07/2021   HDL 54.70 08/07/2021   LDLDIRECT 88.0 08/07/2021   LDLCALC 85 02/14/2020   ALT 28 08/07/2021   AST 30 08/07/2021   NA 136 03/12/2022   K 4.0 03/12/2022   CL 99 03/12/2022   CREATININE 1.08 03/12/2022   BUN 23 03/12/2022   CO2 27 03/12/2022   TSH 5.05 03/12/2022   PSA 1.47 08/07/2021   INR 0.94 12/28/2011   HGBA1C 6.2 03/12/2022    DG Chest 2 View  Result Date: 01/29/2022 CLINICAL DATA:  Cough, fever. EXAM: CHEST - 2 VIEW COMPARISON:  May 25, 2014. FINDINGS: The heart size and mediastinal contours are within normal limits. Both lungs are clear. The visualized skeletal structures are unremarkable. IMPRESSION: No active cardiopulmonary disease. Electronically Signed   By: Marijo Conception M.D.   On: 01/29/2022 08:37    Assessment & Plan:   Zyaire was seen today for hypothyroidism and hypertension.  Diagnoses and all orders for this visit:  Essential hypertension, benign- His blood pressure is adequately well-controlled.  Will continue the combination of an ARB and thiazide diuretic. -     Basic metabolic panel; Future -     CBC with Differential/Platelet; Future -     TSH; Future -     CT CARDIAC SCORING (SELF PAY ONLY); Future -     TSH -     CBC with Differential/Platelet -     Basic metabolic panel -     losartan (COZAAR) 100 MG tablet; Take 1 tablet (100 mg total) by mouth daily. -     indapamide (LOZOL) 1.25 MG tablet; Take 1 tablet (1.25 mg total) by mouth daily.  Acquired hypothyroidism- He is euthyroid. -     CBC with Differential/Platelet; Future -     TSH; Future -     TSH -     CBC with  Differential/Platelet -     levothyroxine (SYNTHROID) 50 MCG tablet; Take 1 tablet (50 mcg total) by mouth daily before breakfast.  Prediabetes -     Basic metabolic panel; Future -     Hemoglobin A1c; Future -  Hemoglobin A1c -     Basic metabolic panel  Hyperlipidemia with target LDL less than 130- I recommended a coronary calcium score to gauge his risk for coronary artery disease. -     CT CARDIAC SCORING (SELF PAY ONLY); Future -     atorvastatin (LIPITOR) 20 MG tablet; Take 1 tablet (20 mg total) by mouth daily.   I have discontinued Deloss Forker's azithromycin. I have also changed his levothyroxine and indapamide. Additionally, I am having him maintain his aspirin, Cholecalciferol (VITAMIN D PO), dorzolamide-timolol, Alrex, Vitamins/Minerals, bimatoprost, guaiFENesin, metoprolol succinate, losartan, and atorvastatin.  Meds ordered this encounter  Medications   losartan (COZAAR) 100 MG tablet    Sig: Take 1 tablet (100 mg total) by mouth daily.    Dispense:  90 tablet    Refill:  1   levothyroxine (SYNTHROID) 50 MCG tablet    Sig: Take 1 tablet (50 mcg total) by mouth daily before breakfast.    Dispense:  90 tablet    Refill:  1   indapamide (LOZOL) 1.25 MG tablet    Sig: Take 1 tablet (1.25 mg total) by mouth daily.    Dispense:  90 tablet    Refill:  1   atorvastatin (LIPITOR) 20 MG tablet    Sig: Take 1 tablet (20 mg total) by mouth daily.    Dispense:  90 tablet    Refill:  1     Follow-up: Return in about 6 months (around 09/12/2022).  Scarlette Calico, MD

## 2022-03-12 NOTE — Patient Instructions (Signed)

## 2022-03-13 MED ORDER — LOSARTAN POTASSIUM 100 MG PO TABS: 100.0000 mg | ORAL_TABLET | Freq: Every day | ORAL | 1 refills | Status: DC

## 2022-03-13 MED ORDER — ATORVASTATIN CALCIUM 20 MG PO TABS: 20.0000 mg | ORAL_TABLET | Freq: Every day | ORAL | 1 refills | Status: DC

## 2022-03-13 MED ORDER — LEVOTHYROXINE SODIUM 50 MCG PO TABS: 50.0000 ug | ORAL_TABLET | Freq: Every day | ORAL | 1 refills | Status: DC

## 2022-03-13 MED ORDER — INDAPAMIDE 1.25 MG PO TABS: 1.2500 mg | ORAL_TABLET | Freq: Every day | ORAL | 1 refills | Status: DC

## 2022-04-12 ENCOUNTER — Ambulatory Visit
Admission: RE | Admit: 2022-04-12 | Discharge: 2022-04-12 | Disposition: A | Discharge status: Home / Self Care | Payer: Medicare Other | Source: Ambulatory Visit | Attending: Internal Medicine | Admitting: Internal Medicine

## 2022-04-12 DIAGNOSIS — E7800 Pure hypercholesterolemia, unspecified: Secondary | ICD-10-CM | POA: Diagnosis not present

## 2022-04-12 DIAGNOSIS — I10 Essential (primary) hypertension: Secondary | ICD-10-CM

## 2022-04-12 DIAGNOSIS — E785 Hyperlipidemia, unspecified: Secondary | ICD-10-CM

## 2022-04-13 ENCOUNTER — Other Ambulatory Visit: Payer: Self-pay | Admitting: Internal Medicine

## 2022-05-03 DIAGNOSIS — H401111 Primary open-angle glaucoma, right eye, mild stage: Secondary | ICD-10-CM | POA: Diagnosis not present

## 2022-05-03 DIAGNOSIS — H40032 Anatomical narrow angle, left eye: Secondary | ICD-10-CM | POA: Diagnosis not present

## 2022-05-03 DIAGNOSIS — H40022 Open angle with borderline findings, high risk, left eye: Secondary | ICD-10-CM | POA: Diagnosis not present

## 2022-05-03 DIAGNOSIS — H34831 Tributary (branch) retinal vein occlusion, right eye, with macular edema: Secondary | ICD-10-CM | POA: Diagnosis not present

## 2022-05-10 ENCOUNTER — Other Ambulatory Visit: Payer: Self-pay | Admitting: Internal Medicine

## 2022-05-10 ENCOUNTER — Telehealth: Payer: Self-pay | Admitting: Radiology

## 2022-05-10 DIAGNOSIS — I10 Essential (primary) hypertension: Secondary | ICD-10-CM

## 2022-05-10 NOTE — Telephone Encounter (Signed)
Contacted Antonio Acosta to schedule their annual wellness visit.  Left voice mail for patient to call back at [phone] to schedule Medicare Annual Wellness Visit    Last AWV:  09/06/20   Please schedule with [facility]. CMA

## 2022-05-16 ENCOUNTER — Encounter (INDEPENDENT_AMBULATORY_CARE_PROVIDER_SITE_OTHER): Payer: Medicare Other | Admitting: Ophthalmology

## 2022-05-16 DIAGNOSIS — I10 Essential (primary) hypertension: Secondary | ICD-10-CM | POA: Diagnosis not present

## 2022-05-16 DIAGNOSIS — H35033 Hypertensive retinopathy, bilateral: Secondary | ICD-10-CM

## 2022-05-16 DIAGNOSIS — H43812 Vitreous degeneration, left eye: Secondary | ICD-10-CM | POA: Diagnosis not present

## 2022-05-16 DIAGNOSIS — H34831 Tributary (branch) retinal vein occlusion, right eye, with macular edema: Secondary | ICD-10-CM | POA: Diagnosis not present

## 2022-05-16 DIAGNOSIS — D3132 Benign neoplasm of left choroid: Secondary | ICD-10-CM

## 2022-07-25 ENCOUNTER — Encounter (INDEPENDENT_AMBULATORY_CARE_PROVIDER_SITE_OTHER): Payer: Medicare Other | Admitting: Ophthalmology

## 2022-07-25 DIAGNOSIS — H35033 Hypertensive retinopathy, bilateral: Secondary | ICD-10-CM

## 2022-07-25 DIAGNOSIS — H43812 Vitreous degeneration, left eye: Secondary | ICD-10-CM

## 2022-07-25 DIAGNOSIS — D3132 Benign neoplasm of left choroid: Secondary | ICD-10-CM | POA: Diagnosis not present

## 2022-07-25 DIAGNOSIS — I10 Essential (primary) hypertension: Secondary | ICD-10-CM | POA: Diagnosis not present

## 2022-07-25 DIAGNOSIS — H34831 Tributary (branch) retinal vein occlusion, right eye, with macular edema: Secondary | ICD-10-CM | POA: Diagnosis not present

## 2022-08-19 ENCOUNTER — Other Ambulatory Visit: Payer: Self-pay | Admitting: Internal Medicine

## 2022-08-19 ENCOUNTER — Encounter: Payer: Self-pay | Admitting: Internal Medicine

## 2022-08-19 DIAGNOSIS — I10 Essential (primary) hypertension: Secondary | ICD-10-CM

## 2022-08-19 MED ORDER — METOPROLOL SUCCINATE ER 100 MG PO TB24: ORAL_TABLET | ORAL | 0 refills | Status: DC

## 2022-10-10 ENCOUNTER — Encounter (INDEPENDENT_AMBULATORY_CARE_PROVIDER_SITE_OTHER): Payer: Medicare Other | Admitting: Ophthalmology

## 2022-10-10 DIAGNOSIS — I10 Essential (primary) hypertension: Secondary | ICD-10-CM

## 2022-10-10 DIAGNOSIS — H35033 Hypertensive retinopathy, bilateral: Secondary | ICD-10-CM

## 2022-10-10 DIAGNOSIS — H34831 Tributary (branch) retinal vein occlusion, right eye, with macular edema: Secondary | ICD-10-CM | POA: Diagnosis not present

## 2022-10-10 DIAGNOSIS — H43812 Vitreous degeneration, left eye: Secondary | ICD-10-CM

## 2022-10-10 DIAGNOSIS — D3132 Benign neoplasm of left choroid: Secondary | ICD-10-CM | POA: Diagnosis not present

## 2022-10-10 DIAGNOSIS — H2512 Age-related nuclear cataract, left eye: Secondary | ICD-10-CM | POA: Diagnosis not present

## 2022-10-14 ENCOUNTER — Encounter: Payer: Self-pay | Admitting: Internal Medicine

## 2022-10-14 ENCOUNTER — Ambulatory Visit (INDEPENDENT_AMBULATORY_CARE_PROVIDER_SITE_OTHER): Payer: Medicare Other | Admitting: Internal Medicine

## 2022-10-14 VITALS — BP 148/84 | HR 67 | Temp 98.2°F | Resp 16 | Ht 72.0 in | Wt 202.0 lb

## 2022-10-14 DIAGNOSIS — N400 Enlarged prostate without lower urinary tract symptoms: Secondary | ICD-10-CM

## 2022-10-14 DIAGNOSIS — Z23 Encounter for immunization: Secondary | ICD-10-CM | POA: Diagnosis not present

## 2022-10-14 DIAGNOSIS — R7303 Prediabetes: Secondary | ICD-10-CM

## 2022-10-14 DIAGNOSIS — R9431 Abnormal electrocardiogram [ECG] [EKG]: Secondary | ICD-10-CM | POA: Diagnosis not present

## 2022-10-14 DIAGNOSIS — E039 Hypothyroidism, unspecified: Secondary | ICD-10-CM | POA: Diagnosis not present

## 2022-10-14 DIAGNOSIS — E785 Hyperlipidemia, unspecified: Secondary | ICD-10-CM | POA: Diagnosis not present

## 2022-10-14 DIAGNOSIS — I10 Essential (primary) hypertension: Secondary | ICD-10-CM | POA: Diagnosis not present

## 2022-10-14 LAB — HEPATIC FUNCTION PANEL
ALT: 24 U/L (ref 0–53)
AST: 30 U/L (ref 0–37)
Albumin: 4.5 g/dL (ref 3.5–5.2)
Alkaline Phosphatase: 87 U/L (ref 39–117)
Bilirubin, Direct: 0.2 mg/dL (ref 0.0–0.3)
Total Bilirubin: 0.9 mg/dL (ref 0.2–1.2)
Total Protein: 7.2 g/dL (ref 6.0–8.3)

## 2022-10-14 LAB — CBC WITH DIFFERENTIAL/PLATELET
Basophils Absolute: 0.1 10*3/uL (ref 0.0–0.1)
Basophils Relative: 0.6 % (ref 0.0–3.0)
Eosinophils Absolute: 0.4 10*3/uL (ref 0.0–0.7)
Eosinophils Relative: 4.2 % (ref 0.0–5.0)
HCT: 42 % (ref 39.0–52.0)
Hemoglobin: 13.6 g/dL (ref 13.0–17.0)
Lymphocytes Relative: 32.8 % (ref 12.0–46.0)
Lymphs Abs: 2.7 10*3/uL (ref 0.7–4.0)
MCHC: 32.5 g/dL (ref 30.0–36.0)
MCV: 85.6 fL (ref 78.0–100.0)
Monocytes Absolute: 0.9 10*3/uL (ref 0.1–1.0)
Monocytes Relative: 10.7 % (ref 3.0–12.0)
Neutro Abs: 4.3 10*3/uL (ref 1.4–7.7)
Neutrophils Relative %: 51.7 % (ref 43.0–77.0)
Platelets: 278 10*3/uL (ref 150.0–400.0)
RBC: 4.91 Mil/uL (ref 4.22–5.81)
RDW: 13.9 % (ref 11.5–15.5)
WBC: 8.3 10*3/uL (ref 4.0–10.5)

## 2022-10-14 LAB — PSA: PSA: 1.59 ng/mL (ref 0.10–4.00)

## 2022-10-14 LAB — LIPID PANEL
Cholesterol: 150 mg/dL (ref 0–200)
HDL: 57.8 mg/dL (ref >39.00)
LDL Cholesterol: 70 mg/dL (ref 0–99)
NonHDL: 92.33
Total CHOL/HDL Ratio: 3
Triglycerides: 113 mg/dL (ref 0.0–149.0)
VLDL: 22.6 mg/dL (ref 0.0–40.0)

## 2022-10-14 LAB — TSH: TSH: 6.26 u[IU]/mL — ABNORMAL HIGH (ref 0.35–5.50)

## 2022-10-14 LAB — HEMOGLOBIN A1C: Hgb A1c MFr Bld: 6.2 % (ref 4.6–6.5)

## 2022-10-14 LAB — BASIC METABOLIC PANEL
BUN: 21 mg/dL (ref 6–23)
CO2: 26 meq/L (ref 19–32)
Calcium: 9.7 mg/dL (ref 8.4–10.5)
Chloride: 99 meq/L (ref 96–112)
Creatinine, Ser: 1.08 mg/dL (ref 0.40–1.50)
GFR: 69.7 mL/min (ref >60.00)
Glucose, Bld: 102 mg/dL — ABNORMAL HIGH (ref 70–99)
Potassium: 4.1 meq/L (ref 3.5–5.1)
Sodium: 136 meq/L (ref 135–145)

## 2022-10-14 NOTE — Patient Instructions (Signed)
Hypertension, Adult High blood pressure (hypertension) is when the force of blood pumping through the arteries is too strong. The arteries are the blood vessels that carry blood from the heart throughout the body. Hypertension forces the heart to work harder to pump blood and may cause arteries to become narrow or stiff. Untreated or uncontrolled hypertension can lead to a heart attack, heart failure, a stroke, kidney disease, and other problems. A blood pressure reading consists of a higher number over a lower number. Ideally, your blood pressure should be below 120/80. The first ("top") number is called the systolic pressure. It is a measure of the pressure in your arteries as your heart beats. The second ("bottom") number is called the diastolic pressure. It is a measure of the pressure in your arteries as the heart relaxes. What are the causes? The exact cause of this condition is not known. There are some conditions that result in high blood pressure. What increases the risk? Certain factors may make you more likely to develop high blood pressure. Some of these risk factors are under your control, including: Smoking. Not getting enough exercise or physical activity. Being overweight. Having too much fat, sugar, calories, or salt (sodium) in your diet. Drinking too much alcohol. Other risk factors include: Having a personal history of heart disease, diabetes, high cholesterol, or kidney disease. Stress. Having a family history of high blood pressure and high cholesterol. Having obstructive sleep apnea. Age. The risk increases with age. What are the signs or symptoms? High blood pressure may not cause symptoms. Very high blood pressure (hypertensive crisis) may cause: Headache. Fast or irregular heartbeats (palpitations). Shortness of breath. Nosebleed. Nausea and vomiting. Vision changes. Severe chest pain, dizziness, and seizures. How is this diagnosed? This condition is diagnosed by  measuring your blood pressure while you are seated, with your arm resting on a flat surface, your legs uncrossed, and your feet flat on the floor. The cuff of the blood pressure monitor will be placed directly against the skin of your upper arm at the level of your heart. Blood pressure should be measured at least twice using the same arm. Certain conditions can cause a difference in blood pressure between your right and left arms. If you have a high blood pressure reading during one visit or you have normal blood pressure with other risk factors, you may be asked to: Return on a different day to have your blood pressure checked again. Monitor your blood pressure at home for 1 week or longer. If you are diagnosed with hypertension, you may have other blood or imaging tests to help your health care provider understand your overall risk for other conditions. How is this treated? This condition is treated by making healthy lifestyle changes, such as eating healthy foods, exercising more, and reducing your alcohol intake. You may be referred for counseling on a healthy diet and physical activity. Your health care provider may prescribe medicine if lifestyle changes are not enough to get your blood pressure under control and if: Your systolic blood pressure is above 130. Your diastolic blood pressure is above 80. Your personal target blood pressure may vary depending on your medical conditions, your age, and other factors. Follow these instructions at home: Eating and drinking  Eat a diet that is high in fiber and potassium, and low in sodium, added sugar, and fat. An example of this eating plan is called the DASH diet. DASH stands for Dietary Approaches to Stop Hypertension. To eat this way: Eat   plenty of fresh fruits and vegetables. Try to fill one half of your plate at each meal with fruits and vegetables. Eat whole grains, such as whole-wheat pasta, brown rice, or whole-grain bread. Fill about one  fourth of your plate with whole grains. Eat or drink low-fat dairy products, such as skim milk or low-fat yogurt. Avoid fatty cuts of meat, processed or cured meats, and poultry with skin. Fill about one fourth of your plate with lean proteins, such as fish, chicken without skin, beans, eggs, or tofu. Avoid pre-made and processed foods. These tend to be higher in sodium, added sugar, and fat. Reduce your daily sodium intake. Many people with hypertension should eat less than 1,500 mg of sodium a day. Do not drink alcohol if: Your health care provider tells you not to drink. You are pregnant, may be pregnant, or are planning to become pregnant. If you drink alcohol: Limit how much you have to: 0-1 drink a day for women. 0-2 drinks a day for men. Know how much alcohol is in your drink. In the [place] one drink equals one 12 oz bottle of beer (355 mL), one 5 oz glass of wine (148 mL), or one 1 oz glass of hard liquor (44 mL). Lifestyle  Work with your health care provider to maintain a healthy body weight or to lose weight. Ask what an ideal weight is for you. Get at least 30 minutes of exercise that causes your heart to beat faster (aerobic exercise) most days of the week. Activities may include walking, swimming, or biking. Include exercise to strengthen your muscles (resistance exercise), such as Pilates or lifting weights, as part of your weekly exercise routine. Try to do these types of exercises for 30 minutes at least 3 days a week. Do not use any products that contain nicotine or tobacco. These products include cigarettes, chewing tobacco, and vaping devices, such as e-cigarettes. If you need help quitting, ask your health care provider. Monitor your blood pressure at home as told by your health care provider. Keep all follow-up visits. This is important. Medicines Take over-the-counter and prescription medicines only as told by your health care provider. Follow directions carefully. Blood  pressure medicines must be taken as prescribed. Do not skip doses of blood pressure medicine. Doing this puts you at risk for problems and can make the medicine less effective. Ask your health care provider about side effects or reactions to medicines that you should watch for. Contact a health care provider if you: Think you are having a reaction to a medicine you are taking. Have headaches that keep coming back (recurring). Feel dizzy. Have swelling in your ankles. Have trouble with your vision. Get help right away if you: Develop a severe headache or confusion. Have unusual weakness or numbness. Feel faint. Have severe pain in your chest or abdomen. Vomit repeatedly. Have trouble breathing. These symptoms may be an emergency. Get help right away. Call 911. Do not wait to see if the symptoms will go away. Do not drive yourself to the hospital. Summary Hypertension is when the force of blood pumping through your arteries is too strong. If this condition is not controlled, it may put you at risk for serious complications. Your personal target blood pressure may vary depending on your medical conditions, your age, and other factors. For most people, a normal blood pressure is less than 120/80. Hypertension is treated with lifestyle changes, medicines, or a combination of both. Lifestyle changes include losing weight, eating a healthy,   low-sodium diet, exercising more, and limiting alcohol. This information is not intended to replace advice given to you by your health care provider. Make sure you discuss any questions you have with your health care provider. Document Revised: 10/31/2020 Document Reviewed: 10/31/2020 Elsevier Patient Education  2024 [employer]  

## 2022-10-14 NOTE — Progress Notes (Unsigned)
Subjective:  Patient ID: Antonio Acosta, male    DOB: [birth year]  Age: 72 y.o. MRN: [id number]  CC: Hyperlipidemia, Hypothyroidism, and Hypertension   HPI Antonio Acosta presents for f/up ----  Discussed the use of AI scribe software for clinical note transcription with the patient, who gave verbal consent to proceed.  History of Present Illness   The patient, with a history of hypertension, reports feeling well and maintaining an active lifestyle. They deny experiencing chest pain, shortness of breath, dizziness, or lightheadedness. However, they do note occasional episodes of lightheadedness upon standing, which they attribute to their antihypertensive medication. This symptom is infrequent and has not been associated with any significant distress or functional impairment. They have not checked their blood pressure during these episodes. He walks 3-4 miles QOD have has good exertion with no DOE/CP/edema       Outpatient Medications Prior to Visit  Medication Sig Dispense Refill   ALREX 0.2 % SUSP Eye drops for retina/pressure     bimatoprost (LUMIGAN) 0.03 % ophthalmic solution 1 drop at bedtime.     Cholecalciferol (VITAMIN D PO) Take 1 capsule by mouth daily.     dorzolamide-timolol (COSOPT) 22.3-6.8 MG/ML ophthalmic solution INSTILL 1 DROP INTO BOTH EYES TWICE A DAY     Vitamins/Minerals TABS Take by mouth.     aspirin 81 MG tablet Take 1 tablet (81 mg total) by mouth daily. 90 tablet 1   atorvastatin (LIPITOR) 20 MG tablet Take 1 tablet (20 mg total) by mouth daily. 90 tablet 1   guaiFENesin (MUCINEX) 600 MG 12 hr tablet Take 1 tablet (600 mg total) by mouth 2 (two) times daily as needed for cough or to loosen phlegm. 20 tablet 0   indapamide (LOZOL) 1.25 MG tablet Take 1 tablet (1.25 mg total) by mouth daily. 90 tablet 1   levothyroxine (SYNTHROID) 50 MCG tablet Take 1 tablet (50 mcg total) by mouth daily before breakfast. 90 tablet 1   losartan (COZAAR) 100 MG tablet Take 1 tablet  (100 mg total) by mouth daily. 90 tablet 1   metoprolol succinate (TOPROL-XL) 100 MG 24 hr tablet TAKE 1 TABLET BY MOUTH EVERY DAY WITH OR IMMEDIATELY FOLLOWING A MEAL 90 tablet 0   No facility-administered medications prior to visit.    ROS Review of Systems  Constitutional:  Positive for unexpected weight change (wt gain). Negative for appetite change, chills, diaphoresis and fatigue.  HENT: Negative.    Eyes: Negative.   Respiratory: Negative.  Negative for apnea, cough, shortness of breath and wheezing.   Cardiovascular:  Negative for chest pain, palpitations and leg swelling.  Gastrointestinal:  Negative for abdominal pain, constipation, diarrhea, nausea and vomiting.  Endocrine: Negative.   Genitourinary: Negative.  Negative for difficulty urinating.  Musculoskeletal: Negative.  Negative for arthralgias and myalgias.  Skin: Negative.   Neurological:  Positive for light-headedness. Negative for dizziness.  Hematological:  Negative for adenopathy. Does not bruise/bleed easily.  Psychiatric/Behavioral: Negative.      Objective:  BP (!) 148/84 (BP Location: Left Arm, Patient Position: Sitting, Cuff Size: Large)   Pulse 67   Temp 98.2 F (36.8 C) (Oral)   Resp 16   Ht 6' (1.829 m)   Wt 202 lb (91.6 kg)   SpO2 98%   BMI 27.40 kg/m   BP Readings from Last 3 Encounters:  10/14/22 (!) 148/84  03/12/22 138/78  01/29/22 (!) 163/95    Wt Readings from Last 3 Encounters:  10/14/22 202 lb (91.6 kg)  03/12/22 198 lb (89.8 kg)  08/07/21 203 lb (92.1 kg)    Physical Exam Vitals reviewed.  Constitutional:      Appearance: Normal appearance.  HENT:     Nose: Nose normal.     Mouth/Throat:     Mouth: Mucous membranes are moist.  Eyes:     General: No scleral icterus.    Conjunctiva/sclera: Conjunctivae normal.  Cardiovascular:     Rate and Rhythm: Normal rate and regular rhythm.     Heart sounds: Normal heart sounds, S1 normal and S2 normal. No murmur heard.    No  friction rub. No gallop.     Comments: EKG- NSR, 63 bpm Septal infarct pattern is new No LVH or acute ST/T wave changes Pulmonary:     Effort: Pulmonary effort is normal.     Breath sounds: No stridor. No wheezing, rhonchi or rales.  Abdominal:     General: Abdomen is flat. There is no distension.     Palpations: There is no mass.     Tenderness: There is no abdominal tenderness. There is no guarding or rebound.     Hernia: No hernia is present. There is no hernia in the left inguinal area or right inguinal area.  Genitourinary:    Pubic Area: No rash.      Penis: Normal and circumcised.      Testes: Normal.     Epididymis:     Right: Normal.     Left: Normal.     Prostate: Enlarged. Not tender and no nodules present.     Rectum: Normal. Guaiac result negative. No mass, tenderness, anal fissure, external hemorrhoid or internal hemorrhoid. Normal anal tone.  Musculoskeletal:        General: No swelling.     Cervical back: Neck supple.     Right lower leg: No edema.     Left lower leg: No edema.  Lymphadenopathy:     Cervical: No cervical adenopathy.     Lower Body: No right inguinal adenopathy. No left inguinal adenopathy.  Skin:    General: Skin is warm and dry.  Neurological:     General: No focal deficit present.     Mental Status: He is alert. Mental status is at baseline.  Psychiatric:        Mood and Affect: Mood normal.        Behavior: Behavior normal.        Thought Content: Thought content normal.        Judgment: Judgment normal.     Lab Results  Component Value Date   WBC 8.3 10/14/2022   HGB 13.6 10/14/2022   HCT 42.0 10/14/2022   PLT 278.0 10/14/2022   GLUCOSE 102 (H) 10/14/2022   CHOL 150 10/14/2022   TRIG 113.0 10/14/2022   HDL 57.80 10/14/2022   LDLDIRECT 88.0 08/07/2021   LDLCALC 70 10/14/2022   ALT 24 10/14/2022   AST 30 10/14/2022   NA 136 10/14/2022   K 4.1 10/14/2022   CL 99 10/14/2022   CREATININE 1.08 10/14/2022   BUN 21 10/14/2022    CO2 26 10/14/2022   TSH 6.26 (H) 10/14/2022   PSA 1.59 10/14/2022   INR 0.94 12/28/2011   HGBA1C 6.2 10/14/2022    CT CARDIAC SCORING ([facility] LOCATIONS ONLY)  Result Date: 04/12/2022 CLINICAL DATA:  High cholesterol * Tracking Code: FCC * EXAM: CT CARDIAC CORONARY ARTERY CALCIUM SCORE TECHNIQUE: Non-contrast imaging through the heart was performed using prospective ECG gating. Image post processing  was performed on an independent workstation, allowing for quantitative analysis of the heart and coronary arteries. Note that this exam targets the heart and the chest was not imaged in its entirety. COMPARISON:  None available. FINDINGS: CORONARY CALCIUM SCORES: Left Main: 0 LAD: 225 LCx: 0 RCA: 12 Total Agatston Score: 237 MESA database percentile: 61 AORTA MEASUREMENTS: Ascending Aorta: 4.0 cm Descending Aorta:2.8 cm OTHER FINDINGS: Heart is normal size. Aorta normal caliber. No adenopathy. No confluent airspace opacities or effusions. Scattered calcifications in the descending thoracic aorta. No acute findings in the upper abdomen. Chest wall soft tissues are unremarkable. No acute bony abnormality. 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 Recommend annual imaging followup by CTA or MRA. This recommendation follows 2010 ACCF/AHA/AATS/ACR/ASA/SCA/SCAI/SIR/STS/SVM Guidelines for the Diagnosis and Management of Patients with Thoracic Aortic Disease. Circulation. 2010; 121: R604-V409. Aortic aneurysm NOS (ICD10-I71.9) Electronically Signed   By: Charlett Nose M.D.   On: 04/12/2022 16:22    Assessment & Plan:   Essential hypertension, benign- He has not achieved his blood pressure goal.  Will continue the current antihypertensives and he will continue working on his lifestyle modifications. -     TSH; Future -     Urinalysis, Routine w reflex microscopic; Future -     Hepatic function panel; Future -     Basic metabolic  panel; Future -     CBC with Differential/Platelet; Future -     EKG 12-Lead -     Losartan Potassium; Take 1 tablet (100 mg total) by mouth daily.  Dispense: 90 tablet; Refill: 0 -     Indapamide; Take 1 tablet (1.25 mg total) by mouth daily.  Dispense: 90 tablet; Refill: 0 -     Metoprolol Succinate ER; TAKE 1 TABLET BY MOUTH EVERY DAY WITH OR IMMEDIATELY FOLLOWING A MEAL  Dispense: 90 tablet; Refill: 0  Prediabetes -     Hemoglobin A1c; Future -     Basic metabolic panel; Future  Hyperlipidemia with target LDL less than 130 - LDL goal achieved. Doing well on the statin  -     Lipid panel; Future -     TSH; Future -     Hepatic function panel; Future -     Atorvastatin Calcium; Take 1 tablet (20 mg total) by mouth daily.  Dispense: 90 tablet; Refill: 0 -     Aspirin; Take 1 tablet (81 mg total) by mouth daily. Swallow whole.  Dispense: 90 tablet; Refill: 0  Benign prostatic hyperplasia without lower urinary tract symptoms -     Urinalysis, Routine w reflex microscopic; Future -     PSA; Future  Acquired hypothyroidism- His TSH is up to 6.  Will increase his T4 dosage. -     TSH; Future -     Unithroid; Take 1 tablet (75 mcg total) by mouth daily before breakfast.  Dispense: 90 tablet; Refill: 0  Flu vaccine need -     Flu Vaccine Trivalent High Dose (Fluad)  Abnormal electrocardiogram (ECG) (EKG)- Will evaluate with an echocardiogram. -     ECHOCARDIOGRAM COMPLETE; Future     Follow-up: Return in about 4 months (around 02/14/2023).  Sanda Linger, MD

## 2022-10-15 LAB — URINALYSIS, ROUTINE W REFLEX MICROSCOPIC
Bilirubin Urine: NEGATIVE
Hgb urine dipstick: NEGATIVE
Ketones, ur: NEGATIVE
Leukocytes,Ua: NEGATIVE
Nitrite: NEGATIVE
RBC / HPF: NONE SEEN (ref 0–?)
Specific Gravity, Urine: 1.025 (ref 1.000–1.030)
Total Protein, Urine: NEGATIVE
Urine Glucose: NEGATIVE
Urobilinogen, UA: 0.2 (ref 0.0–1.0)
pH: 5.5 (ref 5.0–8.0)

## 2022-10-15 MED ORDER — UNITHROID 75 MCG PO TABS: 75.0000 ug | ORAL_TABLET | Freq: Every day | ORAL | 0 refills | Status: DC

## 2022-10-15 MED ORDER — METOPROLOL SUCCINATE ER 100 MG PO TB24: ORAL_TABLET | ORAL | 0 refills | Status: DC

## 2022-10-15 MED ORDER — ATORVASTATIN CALCIUM 20 MG PO TABS: 20.0000 mg | ORAL_TABLET | Freq: Every day | ORAL | 0 refills | Status: DC

## 2022-10-15 MED ORDER — LOSARTAN POTASSIUM 100 MG PO TABS: 100.0000 mg | ORAL_TABLET | Freq: Every day | ORAL | 0 refills | Status: DC

## 2022-10-15 MED ORDER — INDAPAMIDE 1.25 MG PO TABS: 1.2500 mg | ORAL_TABLET | Freq: Every day | ORAL | 0 refills | Status: DC

## 2022-10-15 MED ORDER — ASPIRIN 81 MG PO TBEC: 81.0000 mg | DELAYED_RELEASE_TABLET | Freq: Every day | ORAL | 0 refills | Status: DC

## 2022-10-31 ENCOUNTER — Ambulatory Visit (HOSPITAL_BASED_OUTPATIENT_CLINIC_OR_DEPARTMENT_OTHER): Payer: Medicare Other

## 2022-10-31 DIAGNOSIS — R9431 Abnormal electrocardiogram [ECG] [EKG]: Secondary | ICD-10-CM | POA: Diagnosis not present

## 2022-10-31 LAB — ECHOCARDIOGRAM COMPLETE
Area-P 1/2: 3.77 cm2
MV M vel: 5.2 m/s
MV Peak grad: 108.2 mm[Hg]
Radius: 0.5 cm
S' Lateral: 3.01 cm

## 2022-11-04 DIAGNOSIS — H401111 Primary open-angle glaucoma, right eye, mild stage: Secondary | ICD-10-CM | POA: Diagnosis not present

## 2022-11-04 DIAGNOSIS — H40022 Open angle with borderline findings, high risk, left eye: Secondary | ICD-10-CM | POA: Diagnosis not present

## 2022-12-20 ENCOUNTER — Other Ambulatory Visit: Payer: Self-pay | Admitting: Internal Medicine

## 2022-12-20 DIAGNOSIS — E039 Hypothyroidism, unspecified: Secondary | ICD-10-CM

## 2022-12-26 ENCOUNTER — Encounter (INDEPENDENT_AMBULATORY_CARE_PROVIDER_SITE_OTHER): Payer: Medicare Other | Admitting: Ophthalmology

## 2022-12-26 DIAGNOSIS — I10 Essential (primary) hypertension: Secondary | ICD-10-CM | POA: Diagnosis not present

## 2022-12-26 DIAGNOSIS — H43812 Vitreous degeneration, left eye: Secondary | ICD-10-CM

## 2022-12-26 DIAGNOSIS — H34831 Tributary (branch) retinal vein occlusion, right eye, with macular edema: Secondary | ICD-10-CM | POA: Diagnosis not present

## 2022-12-26 DIAGNOSIS — D3132 Benign neoplasm of left choroid: Secondary | ICD-10-CM | POA: Diagnosis not present

## 2022-12-26 DIAGNOSIS — H35033 Hypertensive retinopathy, bilateral: Secondary | ICD-10-CM | POA: Diagnosis not present

## 2023-01-18 ENCOUNTER — Other Ambulatory Visit: Payer: Self-pay | Admitting: Internal Medicine

## 2023-01-18 DIAGNOSIS — E785 Hyperlipidemia, unspecified: Secondary | ICD-10-CM

## 2023-01-18 DIAGNOSIS — I10 Essential (primary) hypertension: Secondary | ICD-10-CM

## 2023-01-29 ENCOUNTER — Other Ambulatory Visit: Payer: Self-pay | Admitting: Internal Medicine

## 2023-01-29 DIAGNOSIS — I10 Essential (primary) hypertension: Secondary | ICD-10-CM

## 2023-02-12 ENCOUNTER — Other Ambulatory Visit: Payer: Self-pay | Admitting: Internal Medicine

## 2023-02-12 DIAGNOSIS — I10 Essential (primary) hypertension: Secondary | ICD-10-CM

## 2023-03-12 ENCOUNTER — Encounter (INDEPENDENT_AMBULATORY_CARE_PROVIDER_SITE_OTHER): Payer: Medicare Other | Admitting: Ophthalmology

## 2023-03-12 DIAGNOSIS — D3132 Benign neoplasm of left choroid: Secondary | ICD-10-CM

## 2023-03-12 DIAGNOSIS — H34831 Tributary (branch) retinal vein occlusion, right eye, with macular edema: Secondary | ICD-10-CM

## 2023-03-12 DIAGNOSIS — H35033 Hypertensive retinopathy, bilateral: Secondary | ICD-10-CM | POA: Diagnosis not present

## 2023-03-12 DIAGNOSIS — I10 Essential (primary) hypertension: Secondary | ICD-10-CM | POA: Diagnosis not present

## 2023-03-12 DIAGNOSIS — H43812 Vitreous degeneration, left eye: Secondary | ICD-10-CM | POA: Diagnosis not present

## 2023-04-11 ENCOUNTER — Other Ambulatory Visit: Payer: Self-pay | Admitting: Internal Medicine

## 2023-04-11 DIAGNOSIS — E039 Hypothyroidism, unspecified: Secondary | ICD-10-CM

## 2023-04-20 ENCOUNTER — Other Ambulatory Visit: Payer: Self-pay | Admitting: Internal Medicine

## 2023-04-20 DIAGNOSIS — I10 Essential (primary) hypertension: Secondary | ICD-10-CM

## 2023-04-20 DIAGNOSIS — E785 Hyperlipidemia, unspecified: Secondary | ICD-10-CM

## 2023-04-25 ENCOUNTER — Other Ambulatory Visit: Payer: Self-pay | Admitting: Internal Medicine

## 2023-04-25 DIAGNOSIS — I7121 Aneurysm of the ascending aorta, without rupture: Secondary | ICD-10-CM

## 2023-04-25 DIAGNOSIS — I712 Thoracic aortic aneurysm, without rupture, unspecified: Secondary | ICD-10-CM | POA: Insufficient documentation

## 2023-05-05 DIAGNOSIS — H40022 Open angle with borderline findings, high risk, left eye: Secondary | ICD-10-CM | POA: Diagnosis not present

## 2023-05-05 DIAGNOSIS — H401111 Primary open-angle glaucoma, right eye, mild stage: Secondary | ICD-10-CM | POA: Diagnosis not present

## 2023-05-05 DIAGNOSIS — H25812 Combined forms of age-related cataract, left eye: Secondary | ICD-10-CM | POA: Diagnosis not present

## 2023-05-05 DIAGNOSIS — D3132 Benign neoplasm of left choroid: Secondary | ICD-10-CM | POA: Diagnosis not present

## 2023-05-05 DIAGNOSIS — H35371 Puckering of macula, right eye: Secondary | ICD-10-CM | POA: Diagnosis not present

## 2023-05-07 ENCOUNTER — Other Ambulatory Visit: Payer: Self-pay | Admitting: Internal Medicine

## 2023-05-07 DIAGNOSIS — I10 Essential (primary) hypertension: Secondary | ICD-10-CM

## 2023-05-12 ENCOUNTER — Other Ambulatory Visit: Payer: Self-pay | Admitting: Internal Medicine

## 2023-05-12 DIAGNOSIS — E039 Hypothyroidism, unspecified: Secondary | ICD-10-CM

## 2023-05-13 ENCOUNTER — Other Ambulatory Visit: Payer: Self-pay | Admitting: Internal Medicine

## 2023-05-13 DIAGNOSIS — I10 Essential (primary) hypertension: Secondary | ICD-10-CM

## 2023-05-13 DIAGNOSIS — E039 Hypothyroidism, unspecified: Secondary | ICD-10-CM

## 2023-05-15 MED ORDER — LOSARTAN POTASSIUM 100 MG PO TABS: 100.0000 mg | ORAL_TABLET | Freq: Every day | ORAL | 0 refills | Status: DC

## 2023-05-15 MED ORDER — LEVOTHYROXINE SODIUM 75 MCG PO TABS: 75.0000 ug | ORAL_TABLET | Freq: Every day | ORAL | 0 refills | Status: DC

## 2023-05-15 MED ORDER — METOPROLOL SUCCINATE ER 100 MG PO TB24: ORAL_TABLET | ORAL | 0 refills | Status: DC

## 2023-05-20 ENCOUNTER — Ambulatory Visit
Admission: RE | Admit: 2023-05-20 | Discharge: 2023-05-20 | Disposition: A | Discharge status: Home / Self Care | Source: Ambulatory Visit | Attending: Internal Medicine | Admitting: Internal Medicine

## 2023-05-20 ENCOUNTER — Telehealth: Payer: Self-pay | Admitting: Internal Medicine

## 2023-05-20 DIAGNOSIS — I700 Atherosclerosis of aorta: Secondary | ICD-10-CM | POA: Diagnosis not present

## 2023-05-20 DIAGNOSIS — I7121 Aneurysm of the ascending aorta, without rupture: Secondary | ICD-10-CM

## 2023-05-20 DIAGNOSIS — R911 Solitary pulmonary nodule: Secondary | ICD-10-CM | POA: Diagnosis not present

## 2023-05-20 DIAGNOSIS — I2510 Atherosclerotic heart disease of native coronary artery without angina pectoris: Secondary | ICD-10-CM | POA: Diagnosis not present

## 2023-05-20 MED ORDER — IOPAMIDOL (ISOVUE-370) INJECTION 76%
500.0000 mL | Freq: Once | INTRAVENOUS | Status: AC | PRN
Start: 2023-05-20 — End: 2023-05-20
  Administered 2023-05-20: 75 mL via INTRAVENOUS

## 2023-05-20 NOTE — Telephone Encounter (Signed)
 Copied from CRM [id number]. Topic: General - Other >> May 20, 2023 11:05 AM Freya Jesus wrote: Reason for CRM: Patient called to requesting a call back from Dr. Rochelle Chu or his nurse. Patient stated it is regarding an appointment for a pre-op screening and he had no idea he was supposed to have any surgery done. Patient is requesting a call back with clarity. Phone: [phone].

## 2023-05-21 NOTE — Telephone Encounter (Signed)
 Unable to reach patient. LMTRC

## 2023-05-22 NOTE — Telephone Encounter (Signed)
 Unable to reach patient. LMTRC

## 2023-05-23 NOTE — Telephone Encounter (Signed)
 Unable to reach patient. This is my 3rd and final attempt to reach this patient. I will be closing the telephone call. If he calls back please open a new telephone call. I did leave a VM for him to return my call.

## 2023-05-25 ENCOUNTER — Other Ambulatory Visit: Payer: Self-pay | Admitting: Internal Medicine

## 2023-05-25 DIAGNOSIS — E785 Hyperlipidemia, unspecified: Secondary | ICD-10-CM

## 2023-05-25 DIAGNOSIS — I10 Essential (primary) hypertension: Secondary | ICD-10-CM

## 2023-05-26 ENCOUNTER — Ambulatory Visit: Payer: Self-pay | Admitting: Internal Medicine

## 2023-06-04 ENCOUNTER — Encounter (INDEPENDENT_AMBULATORY_CARE_PROVIDER_SITE_OTHER): Admitting: Ophthalmology

## 2023-06-04 DIAGNOSIS — H2512 Age-related nuclear cataract, left eye: Secondary | ICD-10-CM | POA: Diagnosis not present

## 2023-06-04 DIAGNOSIS — D3132 Benign neoplasm of left choroid: Secondary | ICD-10-CM

## 2023-06-04 DIAGNOSIS — H35033 Hypertensive retinopathy, bilateral: Secondary | ICD-10-CM | POA: Diagnosis not present

## 2023-06-04 DIAGNOSIS — I10 Essential (primary) hypertension: Secondary | ICD-10-CM

## 2023-06-04 DIAGNOSIS — H43812 Vitreous degeneration, left eye: Secondary | ICD-10-CM

## 2023-06-04 DIAGNOSIS — H34831 Tributary (branch) retinal vein occlusion, right eye, with macular edema: Secondary | ICD-10-CM | POA: Diagnosis not present

## 2023-06-10 ENCOUNTER — Ambulatory Visit (INDEPENDENT_AMBULATORY_CARE_PROVIDER_SITE_OTHER): Admitting: Internal Medicine

## 2023-06-10 ENCOUNTER — Ambulatory Visit: Payer: Self-pay | Admitting: Internal Medicine

## 2023-06-10 ENCOUNTER — Ambulatory Visit (INDEPENDENT_AMBULATORY_CARE_PROVIDER_SITE_OTHER)

## 2023-06-10 ENCOUNTER — Encounter: Payer: Self-pay | Admitting: Internal Medicine

## 2023-06-10 VITALS — BP 132/78 | HR 64 | Temp 97.8°F | Ht 72.0 in | Wt 206.2 lb

## 2023-06-10 DIAGNOSIS — I10 Essential (primary) hypertension: Secondary | ICD-10-CM | POA: Diagnosis not present

## 2023-06-10 DIAGNOSIS — E039 Hypothyroidism, unspecified: Secondary | ICD-10-CM | POA: Diagnosis not present

## 2023-06-10 DIAGNOSIS — R7303 Prediabetes: Secondary | ICD-10-CM

## 2023-06-10 DIAGNOSIS — E785 Hyperlipidemia, unspecified: Secondary | ICD-10-CM | POA: Diagnosis not present

## 2023-06-10 LAB — HEPATIC FUNCTION PANEL
ALT: 27 U/L (ref 0–53)
AST: 30 U/L (ref 0–37)
Albumin: 4.6 g/dL (ref 3.5–5.2)
Alkaline Phosphatase: 83 U/L (ref 39–117)
Bilirubin, Direct: 0.2 mg/dL (ref 0.0–0.3)
Total Bilirubin: 1.2 mg/dL (ref 0.2–1.2)
Total Protein: 7.6 g/dL (ref 6.0–8.3)

## 2023-06-10 LAB — BASIC METABOLIC PANEL WITH GFR
BUN: 15 mg/dL (ref 6–23)
CO2: 27 meq/L (ref 19–32)
Calcium: 9.7 mg/dL (ref 8.4–10.5)
Chloride: 99 meq/L (ref 96–112)
Creatinine, Ser: 1 mg/dL (ref 0.40–1.50)
GFR: 76.09 mL/min (ref >60.00)
Glucose, Bld: 106 mg/dL — ABNORMAL HIGH (ref 70–99)
Potassium: 3.7 meq/L (ref 3.5–5.1)
Sodium: 135 meq/L (ref 135–145)

## 2023-06-10 LAB — HEMOGLOBIN A1C: Hgb A1c MFr Bld: 6.2 % (ref 4.6–6.5)

## 2023-06-10 LAB — URINALYSIS, ROUTINE W REFLEX MICROSCOPIC
Bilirubin Urine: NEGATIVE
Hgb urine dipstick: NEGATIVE
Ketones, ur: NEGATIVE
Leukocytes,Ua: NEGATIVE
Nitrite: NEGATIVE
RBC / HPF: NONE SEEN (ref 0–?)
Specific Gravity, Urine: <=1.005 — AB (ref 1.000–1.030)
Total Protein, Urine: NEGATIVE
Urine Glucose: NEGATIVE
Urobilinogen, UA: 0.2 (ref 0.0–1.0)
WBC, UA: NONE SEEN (ref 0–?)
pH: 6 (ref 5.0–8.0)

## 2023-06-10 LAB — LIPID PANEL
Cholesterol: 151 mg/dL (ref 0–200)
HDL: 57.1 mg/dL (ref >39.00)
LDL Cholesterol: 71 mg/dL (ref 0–99)
NonHDL: 94.07
Total CHOL/HDL Ratio: 3
Triglycerides: 117 mg/dL (ref 0.0–149.0)
VLDL: 23.4 mg/dL (ref 0.0–40.0)

## 2023-06-10 LAB — CBC WITH DIFFERENTIAL/PLATELET
Basophils Absolute: 0.1 10*3/uL (ref 0.0–0.1)
Basophils Relative: 0.9 % (ref 0.0–3.0)
Eosinophils Absolute: 0.3 10*3/uL (ref 0.0–0.7)
Eosinophils Relative: 4.4 % (ref 0.0–5.0)
HCT: 40.6 % (ref 39.0–52.0)
Hemoglobin: 13.7 g/dL (ref 13.0–17.0)
Lymphocytes Relative: 31.5 % (ref 12.0–46.0)
Lymphs Abs: 2.4 10*3/uL (ref 0.7–4.0)
MCHC: 33.7 g/dL (ref 30.0–36.0)
MCV: 84.7 fl (ref 78.0–100.0)
Monocytes Absolute: 0.8 10*3/uL (ref 0.1–1.0)
Monocytes Relative: 10.7 % (ref 3.0–12.0)
Neutro Abs: 4 10*3/uL (ref 1.4–7.7)
Neutrophils Relative %: 52.5 % (ref 43.0–77.0)
Platelets: 250 10*3/uL (ref 150.0–400.0)
RBC: 4.8 Mil/uL (ref 4.22–5.81)
RDW: 14 % (ref 11.5–15.5)
WBC: 7.7 10*3/uL (ref 4.0–10.5)

## 2023-06-10 LAB — TSH: TSH: 3.71 u[IU]/mL (ref 0.35–5.50)

## 2023-06-10 MED ORDER — ASPIRIN 81 MG PO TBEC: 81.0000 mg | DELAYED_RELEASE_TABLET | Freq: Every day | ORAL | 1 refills | Status: AC

## 2023-06-10 MED ORDER — ATORVASTATIN CALCIUM 20 MG PO TABS: 20.0000 mg | ORAL_TABLET | Freq: Every day | ORAL | 1 refills | Status: DC

## 2023-06-10 MED ORDER — LEVOTHYROXINE SODIUM 75 MCG PO TABS: 75.0000 ug | ORAL_TABLET | Freq: Every day | ORAL | 1 refills | Status: DC

## 2023-06-10 MED ORDER — LOSARTAN POTASSIUM 100 MG PO TABS: 100.0000 mg | ORAL_TABLET | Freq: Every day | ORAL | 1 refills | Status: DC

## 2023-06-10 MED ORDER — INDAPAMIDE 1.25 MG PO TABS: 1.2500 mg | ORAL_TABLET | Freq: Every day | ORAL | 1 refills | Status: DC

## 2023-06-10 NOTE — Progress Notes (Unsigned)
 Subjective:  Patient ID: Antonio Acosta, male    DOB: [birth year]  Age: 73 y.o. MRN: [id number]  CC: Hypertension, Hypothyroidism, and Hyperlipidemia   HPI Antonio Acosta presents for f/up ---  Discussed the use of AI scribe software for clinical note transcription with the patient, who gave verbal consent to proceed.  History of Present Illness   Antonio Acosta is a 73 year old male who presents for follow-up after an echocardiogram.  He underwent a CT scan due to concerns about a thoracic aortic aneurysm. The procedure was distressing due to a misunderstanding about its purpose, as he was informed it was a preoperative assessment despite not being scheduled for surgery. Complications arose during the procedure with difficulties in injecting dye, leading to significant bruising and bleeding from his arms, causing distress to both him and his wife.  He remains active and feels well during exertion, with no chest pain, shortness of breath, dizziness, or lightheadedness. No symptoms suggestive of thyroid  dysfunction such as fatigue, constipation, unexplained weight gain, or sleep disturbances.  He is currently taking metoprolol  and receives monthly B12 shots. He monitors his physical activity, noting 2,500 steps by mid-morning, and is concerned about his thyroid  levels, which were previously thought to be low.       Outpatient Medications Prior to Visit  Medication Sig Dispense Refill   ALREX 0.2 % SUSP Eye drops for retina/pressure     bimatoprost (LUMIGAN) 0.03 % ophthalmic solution 1 drop at bedtime.     brimonidine -timolol (COMBIGAN) 0.2-0.5 % ophthalmic solution Place 1 drop into the right eye 2 (two) times daily.     Cholecalciferol (VITAMIN D  PO) Take 1 capsule by mouth daily.     dorzolamide  (TRUSOPT ) 2 % ophthalmic solution Place 1 drop into both eyes 2 (two) times daily.     dorzolamide -timolol (COSOPT) 22.3-6.8 MG/ML ophthalmic solution INSTILL 1 DROP INTO BOTH EYES TWICE A DAY      metoprolol  succinate (TOPROL -XL) 100 MG 24 hr tablet TAKE 1 TABLET BY MOUTH EVERY DAY WITH OR IMMEDIATELY FOLLOWING A MEAL 90 tablet 0   pilocarpine (PILOCAR) 2 % ophthalmic solution Place 1 drop into the right eye daily.     trimethoprim-polymyxin b  (POLYTRIM) ophthalmic solution INSTILL ONE DROP INTO THE RIGHT EYE 4 TIMES A DAY FOR 2 DAYS AFTER EACH MONTHLY EYE INJECTION     Vitamins/Minerals TABS Take by mouth.     aspirin  EC 81 MG tablet Take 1 tablet (81 mg total) by mouth daily. Swallow whole. 90 tablet 0   atorvastatin  (LIPITOR) 20 MG tablet TAKE 1 TABLET (20 MG TOTAL) BY MOUTH DAILY. NEEDS APPOINTMENT FOR REFILLS 30 tablet 0   indapamide  (LOZOL ) 1.25 MG tablet TAKE 1 TABLET (1.25 MG TOTAL) BY MOUTH DAILY. NEEDS APPOINTMENT FOR REFILLS 30 tablet 0   levothyroxine  (SYNTHROID ) 75 MCG tablet Take 1 tablet (75 mcg total) by mouth daily before breakfast. Please schedule an appt with PCP for further refills 30 tablet 0   losartan  (COZAAR ) 100 MG tablet Take 1 tablet (100 mg total) by mouth daily. 90 tablet 0   No facility-administered medications prior to visit.    ROS Review of Systems  Constitutional: Negative.  Negative for appetite change, chills, diaphoresis, fatigue and fever.  HENT: Negative.    Eyes: Negative.   Respiratory:  Negative for cough, chest tightness, shortness of breath and wheezing.   Cardiovascular:  Negative for chest pain, palpitations and leg swelling.  Gastrointestinal:  Negative for abdominal pain, constipation, diarrhea, nausea  and vomiting.  Endocrine: Negative.  Negative for cold intolerance and heat intolerance.  Genitourinary: Negative.  Negative for difficulty urinating.  Musculoskeletal: Negative.  Negative for arthralgias and myalgias.  Skin: Negative.   Neurological:  Negative for dizziness, weakness, light-headedness and headaches.  Hematological:  Negative for adenopathy. Does not bruise/bleed easily.  Psychiatric/Behavioral: Negative.       Objective:  BP 132/78 (BP Location: Left Arm, Patient Position: Sitting, Cuff Size: Normal)   Pulse 64   Temp 97.8 F (36.6 C) (Oral)   Ht 6' (1.829 m)   Wt 206 lb 3.2 oz (93.5 kg)   SpO2 98%   BMI 27.97 kg/m   BP Readings from Last 3 Encounters:  06/10/23 132/78  10/14/22 (!) 148/84  03/12/22 138/78    Wt Readings from Last 3 Encounters:  06/10/23 206 lb 3.2 oz (93.5 kg)  10/14/22 202 lb (91.6 kg)  03/12/22 198 lb (89.8 kg)    Physical Exam Vitals reviewed.  Constitutional:      Appearance: Normal appearance.  HENT:     Nose: Nose normal.     Mouth/Throat:     Mouth: Mucous membranes are moist.  Eyes:     General: No scleral icterus.    Conjunctiva/sclera: Conjunctivae normal.  Cardiovascular:     Rate and Rhythm: Normal rate and regular rhythm.     Heart sounds: Normal heart sounds, S1 normal and S2 normal.     No friction rub. No gallop.     Comments: EKG-- SR with 1st degree AV block (new), 64 bpm LAD,anterior infarct pattern is unchanged No LVH Pulmonary:     Effort: Pulmonary effort is normal.     Breath sounds: No stridor. No wheezing, rhonchi or rales.  Abdominal:     General: Abdomen is flat.     Palpations: There is no mass.     Tenderness: There is no abdominal tenderness. There is no guarding.     Hernia: No hernia is present.  Musculoskeletal:     Cervical back: Neck supple.     Right lower leg: No edema.     Left lower leg: No edema.  Lymphadenopathy:     Cervical: No cervical adenopathy.  Skin:    General: Skin is warm and dry.  Neurological:     General: No focal deficit present.     Mental Status: He is alert. Mental status is at baseline.  Psychiatric:        Mood and Affect: Mood normal.        Behavior: Behavior normal.     Lab Results  Component Value Date   WBC 7.7 06/10/2023   HGB 13.7 06/10/2023   HCT 40.6 06/10/2023   PLT 250.0 06/10/2023   GLUCOSE 106 (H) 06/10/2023   CHOL 151 06/10/2023   TRIG 117.0 06/10/2023    HDL 57.10 06/10/2023   LDLDIRECT 88.0 08/07/2021   LDLCALC 71 06/10/2023   ALT 27 06/10/2023   AST 30 06/10/2023   NA 135 06/10/2023   K 3.7 06/10/2023   CL 99 06/10/2023   CREATININE 1.00 06/10/2023   BUN 15 06/10/2023   CO2 27 06/10/2023   TSH 3.71 06/10/2023   PSA 1.59 10/14/2022   INR 0.94 12/28/2011   HGBA1C 6.2 06/10/2023    CT ANGIO CHEST AORTA W/CM & OR WO/CM Result Date: 05/25/2023 CLINICAL DATA:  Thoracic aortic aneurysm EXAM: CT ANGIOGRAPHY CHEST WITH CONTRAST TECHNIQUE: Multidetector CT imaging of the chest was performed using the standard protocol during  bolus administration of intravenous contrast. Multiplanar CT image reconstructions and MIPs were obtained to evaluate the vascular anatomy. RADIATION DOSE REDUCTION: This exam was performed according to the departmental dose-optimization program which includes automated exposure control, adjustment of the mA and/or kV according to patient size and/or use of iterative reconstruction technique. CONTRAST:  75mL ISOVUE -370 IOPAMIDOL  (ISOVUE -370) INJECTION 76% COMPARISON:  Prior cardiac CT scan 04/12/2022 FINDINGS: Cardiovascular: The aortic root is within normal limits in size at 3.8 cm measured at the sinuses of Valsalva. Ectatic bordering on aneurysmal ascending thoracic aorta with a maximal diameter of 3.9 cm. Conventional 3 vessel arch anatomy. Trace atherosclerotic vascular calcifications along both the aorta and the coronary arteries. The heart is normal in size. No pericardial effusion. Mediastinum/Nodes: Unremarkable CT appearance of the thyroid  gland. No suspicious mediastinal or hilar adenopathy. No soft tissue mediastinal mass. The thoracic esophagus is unremarkable. Lungs/Pleura: Stable subpleural nodule along the left major fissure at 0.9 cm. This is almost certainly benign given no interval change over the past 1 year. No suspicious pulmonary mass or nodule. No focal airspace infiltrate, pleural effusion or pneumothorax.  Upper Abdomen: No acute abnormality. Musculoskeletal: No chest wall abnormality. No acute or significant osseous findings. Review of the MIP images confirms the above findings. IMPRESSION: 1. 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 2. Aortic and coronary artery atherosclerotic vascular calcifications. 3. Stable and almost certainly benign 9 mm subpleural pulmonary nodule along the left major fissure. Aortic Atherosclerosis (ICD10-I70.0). Electronically Signed   By: Fernando Hoyer M.D.   On: 05/25/2023 11:18    Assessment & Plan:  Essential hypertension, benign -     Basic metabolic panel with GFR; Future -     CBC with Differential/Platelet; Future -     Urinalysis, Routine w reflex microscopic; Future -     EKG 12-Lead -     Losartan  Potassium; Take 1 tablet (100 mg total) by mouth daily.  Dispense: 90 tablet; Refill: 1 -     Indapamide ; Take 1 tablet (1.25 mg total) by mouth daily.  Dispense: 90 tablet; Refill: 1  Hyperlipidemia with target LDL less than 130 -     Lipid panel; Future -     TSH; Future -     Hepatic function panel; Future -     Atorvastatin  Calcium ; Take 1 tablet (20 mg total) by mouth daily. NEEDS APPOINTMENT FOR REFILLS  Dispense: 90 tablet; Refill: 1 -     Aspirin ; Take 1 tablet (81 mg total) by mouth daily. Swallow whole.  Dispense: 90 tablet; Refill: 1  Acquired hypothyroidism -     TSH; Future -     Levothyroxine  Sodium; Take 1 tablet (75 mcg total) by mouth daily before breakfast.  Dispense: 90 tablet; Refill: 1  Prediabetes -     Hemoglobin A1c; Future     Follow-up: Return in about 4 months (around 10/10/2023).  Sandra Crouch, MD

## 2023-06-10 NOTE — Patient Instructions (Signed)

## 2023-08-12 ENCOUNTER — Other Ambulatory Visit: Payer: Self-pay | Admitting: Internal Medicine

## 2023-08-12 DIAGNOSIS — I10 Essential (primary) hypertension: Secondary | ICD-10-CM

## 2023-08-13 ENCOUNTER — Encounter (INDEPENDENT_AMBULATORY_CARE_PROVIDER_SITE_OTHER): Admitting: Ophthalmology

## 2023-08-13 DIAGNOSIS — H2512 Age-related nuclear cataract, left eye: Secondary | ICD-10-CM | POA: Diagnosis not present

## 2023-08-13 DIAGNOSIS — I10 Essential (primary) hypertension: Secondary | ICD-10-CM | POA: Diagnosis not present

## 2023-08-13 DIAGNOSIS — H43812 Vitreous degeneration, left eye: Secondary | ICD-10-CM | POA: Diagnosis not present

## 2023-08-13 DIAGNOSIS — H35033 Hypertensive retinopathy, bilateral: Secondary | ICD-10-CM

## 2023-08-13 DIAGNOSIS — H34831 Tributary (branch) retinal vein occlusion, right eye, with macular edema: Secondary | ICD-10-CM

## 2023-08-13 DIAGNOSIS — D3132 Benign neoplasm of left choroid: Secondary | ICD-10-CM

## 2023-08-14 ENCOUNTER — Encounter: Payer: Self-pay | Admitting: Internal Medicine

## 2023-08-14 NOTE — Telephone Encounter (Signed)
 Last OV 06/10/23 Next OV not scheduled  Last refill 05/15/23 Qty #90/0

## 2023-08-14 NOTE — Telephone Encounter (Signed)
 Last OV 06/10/23 Next OV not scheduled  Last refill

## 2023-08-15 ENCOUNTER — Other Ambulatory Visit: Payer: Self-pay

## 2023-08-15 DIAGNOSIS — I10 Essential (primary) hypertension: Secondary | ICD-10-CM

## 2023-10-21 ENCOUNTER — Encounter (INDEPENDENT_AMBULATORY_CARE_PROVIDER_SITE_OTHER): Admitting: Ophthalmology

## 2023-10-21 DIAGNOSIS — H43812 Vitreous degeneration, left eye: Secondary | ICD-10-CM | POA: Diagnosis not present

## 2023-10-21 DIAGNOSIS — H35033 Hypertensive retinopathy, bilateral: Secondary | ICD-10-CM | POA: Diagnosis not present

## 2023-10-21 DIAGNOSIS — H34831 Tributary (branch) retinal vein occlusion, right eye, with macular edema: Secondary | ICD-10-CM

## 2023-10-21 DIAGNOSIS — I10 Essential (primary) hypertension: Secondary | ICD-10-CM

## 2023-10-21 DIAGNOSIS — D3132 Benign neoplasm of left choroid: Secondary | ICD-10-CM

## 2023-10-21 DIAGNOSIS — H2512 Age-related nuclear cataract, left eye: Secondary | ICD-10-CM | POA: Diagnosis not present

## 2023-11-06 DIAGNOSIS — H40022 Open angle with borderline findings, high risk, left eye: Secondary | ICD-10-CM | POA: Diagnosis not present

## 2023-11-06 DIAGNOSIS — H401111 Primary open-angle glaucoma, right eye, mild stage: Secondary | ICD-10-CM | POA: Diagnosis not present

## 2023-12-07 ENCOUNTER — Other Ambulatory Visit: Payer: Self-pay | Admitting: Internal Medicine

## 2023-12-07 DIAGNOSIS — E039 Hypothyroidism, unspecified: Secondary | ICD-10-CM

## 2023-12-18 ENCOUNTER — Other Ambulatory Visit: Payer: Self-pay | Admitting: Internal Medicine

## 2023-12-18 DIAGNOSIS — I10 Essential (primary) hypertension: Secondary | ICD-10-CM

## 2023-12-18 DIAGNOSIS — E785 Hyperlipidemia, unspecified: Secondary | ICD-10-CM

## 2023-12-23 ENCOUNTER — Encounter (INDEPENDENT_AMBULATORY_CARE_PROVIDER_SITE_OTHER): Admitting: Ophthalmology

## 2023-12-23 DIAGNOSIS — D3132 Benign neoplasm of left choroid: Secondary | ICD-10-CM | POA: Diagnosis not present

## 2023-12-23 DIAGNOSIS — H43812 Vitreous degeneration, left eye: Secondary | ICD-10-CM | POA: Diagnosis not present

## 2023-12-23 DIAGNOSIS — I10 Essential (primary) hypertension: Secondary | ICD-10-CM | POA: Diagnosis not present

## 2023-12-23 DIAGNOSIS — H35033 Hypertensive retinopathy, bilateral: Secondary | ICD-10-CM | POA: Diagnosis not present

## 2023-12-23 DIAGNOSIS — H34831 Tributary (branch) retinal vein occlusion, right eye, with macular edema: Secondary | ICD-10-CM | POA: Diagnosis not present

## 2024-02-04 ENCOUNTER — Encounter: Payer: Self-pay | Admitting: Internal Medicine

## 2024-02-04 ENCOUNTER — Ambulatory Visit: Admitting: Internal Medicine

## 2024-02-04 VITALS — BP 138/82 | HR 65 | Temp 98.5°F | Ht 72.0 in | Wt 204.6 lb

## 2024-02-04 DIAGNOSIS — E871 Hypo-osmolality and hyponatremia: Secondary | ICD-10-CM | POA: Diagnosis not present

## 2024-02-04 DIAGNOSIS — E785 Hyperlipidemia, unspecified: Secondary | ICD-10-CM

## 2024-02-04 DIAGNOSIS — N400 Enlarged prostate without lower urinary tract symptoms: Secondary | ICD-10-CM | POA: Diagnosis not present

## 2024-02-04 DIAGNOSIS — D225 Melanocytic nevi of trunk: Secondary | ICD-10-CM | POA: Diagnosis not present

## 2024-02-04 DIAGNOSIS — E039 Hypothyroidism, unspecified: Secondary | ICD-10-CM | POA: Diagnosis not present

## 2024-02-04 DIAGNOSIS — Z23 Encounter for immunization: Secondary | ICD-10-CM

## 2024-02-04 DIAGNOSIS — I10 Essential (primary) hypertension: Secondary | ICD-10-CM

## 2024-02-04 DIAGNOSIS — R7303 Prediabetes: Secondary | ICD-10-CM | POA: Diagnosis not present

## 2024-02-04 LAB — URINALYSIS, ROUTINE W REFLEX MICROSCOPIC
Bilirubin Urine: NEGATIVE
Hgb urine dipstick: NEGATIVE
Ketones, ur: NEGATIVE
Leukocytes,Ua: NEGATIVE
Nitrite: NEGATIVE
Specific Gravity, Urine: 1.02 (ref 1.000–1.030)
Total Protein, Urine: NEGATIVE
Urine Glucose: NEGATIVE
Urobilinogen, UA: 0.2 (ref 0.0–1.0)
pH: 6 (ref 5.0–8.0)

## 2024-02-04 LAB — LIPID PANEL
Cholesterol: 150 mg/dL (ref 28–200)
HDL: 52.7 mg/dL
LDL Cholesterol: 76 mg/dL (ref 10–99)
NonHDL: 97.61
Total CHOL/HDL Ratio: 3
Triglycerides: 108 mg/dL (ref 10.0–149.0)
VLDL: 21.6 mg/dL (ref 0.0–40.0)

## 2024-02-04 LAB — CBC WITH DIFFERENTIAL/PLATELET
Basophils Absolute: 0.1 10*3/uL (ref 0.0–0.1)
Basophils Relative: 0.6 % (ref 0.0–3.0)
Eosinophils Absolute: 0.2 10*3/uL (ref 0.0–0.7)
Eosinophils Relative: 2.5 % (ref 0.0–5.0)
HCT: 39.9 % (ref 39.0–52.0)
Hemoglobin: 13.8 g/dL (ref 13.0–17.0)
Lymphocytes Relative: 29.1 % (ref 12.0–46.0)
Lymphs Abs: 2.4 10*3/uL (ref 0.7–4.0)
MCHC: 34.5 g/dL (ref 30.0–36.0)
MCV: 83.9 fl (ref 78.0–100.0)
Monocytes Absolute: 0.5 10*3/uL (ref 0.1–1.0)
Monocytes Relative: 6.7 % (ref 3.0–12.0)
Neutro Abs: 5 10*3/uL (ref 1.4–7.7)
Neutrophils Relative %: 61.1 % (ref 43.0–77.0)
Platelets: 250 10*3/uL (ref 150.0–400.0)
RBC: 4.75 Mil/uL (ref 4.22–5.81)
RDW: 13.5 % (ref 11.5–15.5)
WBC: 8.2 10*3/uL (ref 4.0–10.5)

## 2024-02-04 LAB — BASIC METABOLIC PANEL WITH GFR
BUN: 17 mg/dL (ref 6–23)
CO2: 24 meq/L (ref 19–32)
Calcium: 9.4 mg/dL (ref 8.4–10.5)
Chloride: 98 meq/L (ref 96–112)
Creatinine, Ser: 0.94 mg/dL (ref 0.40–1.50)
GFR: 81.58 mL/min
Glucose, Bld: 106 mg/dL — ABNORMAL HIGH (ref 70–99)
Potassium: 3.9 meq/L (ref 3.5–5.1)
Sodium: 133 meq/L — ABNORMAL LOW (ref 135–145)

## 2024-02-04 LAB — PSA: PSA: 1.96 ng/mL (ref 0.10–4.00)

## 2024-02-04 LAB — HEPATIC FUNCTION PANEL
ALT: 28 U/L (ref 3–53)
AST: 30 U/L (ref 5–37)
Albumin: 4.6 g/dL (ref 3.5–5.2)
Alkaline Phosphatase: 95 U/L (ref 39–117)
Bilirubin, Direct: 0.2 mg/dL (ref 0.1–0.3)
Total Bilirubin: 1.3 mg/dL — ABNORMAL HIGH (ref 0.2–1.2)
Total Protein: 7.4 g/dL (ref 6.0–8.3)

## 2024-02-04 LAB — HEMOGLOBIN A1C: Hgb A1c MFr Bld: 6.2 % (ref 4.6–6.5)

## 2024-02-04 LAB — TSH: TSH: 4.28 u[IU]/mL (ref 0.35–5.50)

## 2024-02-04 MED ORDER — COVID-19 MRNA VAC-TRIS(PFIZER) 30 MCG/0.3ML IM SUSY: 0.3000 mL | PREFILLED_SYRINGE | Freq: Once | INTRAMUSCULAR | 0 refills | Status: AC

## 2024-02-04 NOTE — Progress Notes (Unsigned)
 "     Subjective:  Patient ID: Antonio Acosta, male    DOB: 06/23/1950  Age: 74 y.o. MRN: [id number]  CC: Hypertension, Hypothyroidism, and Hyperlipidemia   HPI Antonio Acosta presents for f/up --  Discussed the use of AI scribe software for clinical note transcription with the patient, who gave verbal consent to proceed.  History of Present Illness Antonio Acosta is a 74 year old male who presents for a routine check-up and prostate examination.  He feels well overall and remains active without experiencing chest pain, shortness of breath, dizziness, or lightheadedness during physical activity. He has not received a flu shot yet but has been vaccinated for COVID-19.  No symptoms of fatigue, constipation, weight gain, or other side effects from his current thyroid  medication.  No issues with urination, including no pain or swelling.     Outpatient Medications Prior to Visit  Medication Sig Dispense Refill   ALREX 0.2 % SUSP Eye drops for retina/pressure     aspirin  EC 81 MG tablet Take 1 tablet (81 mg total) by mouth daily. Swallow whole. 90 tablet 1   atorvastatin  (LIPITOR) 20 MG tablet TAKE 1 TABLET (20 MG TOTAL) BY MOUTH DAILY. NEEDS APPOINTMENT FOR REFILLS 60 tablet 0   bimatoprost (LUMIGAN) 0.03 % ophthalmic solution 1 drop at bedtime.     brimonidine -timolol (COMBIGAN) 0.2-0.5 % ophthalmic solution Place 1 drop into the right eye 2 (two) times daily.     Cholecalciferol (VITAMIN D  PO) Take 1 capsule by mouth daily.     dorzolamide  (TRUSOPT ) 2 % ophthalmic solution Place 1 drop into both eyes 2 (two) times daily.     dorzolamide -timolol (COSOPT) 22.3-6.8 MG/ML ophthalmic solution INSTILL 1 DROP INTO BOTH EYES TWICE A DAY     indapamide  (LOZOL ) 1.25 MG tablet TAKE 1 TABLET BY MOUTH DAILY. 60 tablet 0   levothyroxine  (SYNTHROID ) 75 MCG tablet Take 1 tablet (75 mcg total) by mouth daily before breakfast. NEEDS APPOINTMENT FOR REFILLS 90 tablet 0   losartan  (COZAAR ) 100  MG tablet Take 1 tablet (100 mg total) by mouth daily. 90 tablet 1   metoprolol  succinate (TOPROL -XL) 100 MG 24 hr tablet TAKE 1 TABLET BY MOUTH EVERY DAY WITH OR IMMEDIATELY FOLLOWING A MEAL 90 tablet 1   pilocarpine (PILOCAR) 2 % ophthalmic solution Place 1 drop into the right eye daily.     Vitamins/Minerals TABS Take by mouth.     trimethoprim-polymyxin b  (POLYTRIM) ophthalmic solution INSTILL ONE DROP INTO THE RIGHT EYE 4 TIMES A DAY FOR 2 DAYS AFTER EACH MONTHLY EYE INJECTION     No facility-administered medications prior to visit.    ROS Review of Systems  Constitutional:  Negative for appetite change, chills, diaphoresis, fatigue and fever.  HENT: Negative.    Eyes: Negative.   Respiratory: Negative.  Negative for cough, chest tightness, shortness of breath and wheezing.   Cardiovascular:  Negative for chest pain, palpitations and leg swelling.  Gastrointestinal: Negative.  Negative for abdominal pain, constipation, diarrhea, nausea, rectal pain and vomiting.  Endocrine: Negative.  Negative for cold intolerance and heat intolerance.  Genitourinary: Negative.  Negative for difficulty urinating, penile swelling, scrotal swelling and testicular pain.  Musculoskeletal: Negative.  Negative for arthralgias, gait problem, joint swelling and myalgias.  Skin: Negative.   Neurological:  Negative for dizziness, weakness and light-headedness.  Hematological:  Negative for adenopathy. Does not bruise/bleed easily.  Psychiatric/Behavioral: Negative.      Objective:  BP 138/82 (BP Location: Left Arm, Patient Position:  Sitting)   Pulse 65   Temp 98.5 F (36.9 C) (Temporal)   Ht 6' (1.829 m)   Wt 204 lb 9.6 oz (92.8 kg)   SpO2 97%   BMI 27.75 kg/m   BP Readings from Last 3 Encounters:  02/04/24 138/82  06/10/23 132/78  10/14/22 (!) 148/84    Wt Readings from Last 3 Encounters:  02/04/24 204 lb 9.6 oz (92.8 kg)  06/10/23 206 lb 3.2 oz (93.5 kg)  10/14/22 202 lb (91.6 kg)     Physical Exam Vitals reviewed.  Constitutional:      Appearance: Normal appearance.  HENT:     Nose: Nose normal.     Mouth/Throat:     Mouth: Mucous membranes are moist.  Eyes:     General: No scleral icterus.    Conjunctiva/sclera: Conjunctivae normal.  Cardiovascular:     Rate and Rhythm: Normal rate and regular rhythm.     Pulses: Normal pulses.     Heart sounds: No murmur heard.    No friction rub. No gallop.  Pulmonary:     Breath sounds: No stridor. No wheezing, rhonchi or rales.  Abdominal:     General: Abdomen is flat.     Palpations: There is no mass.     Tenderness: There is no abdominal tenderness. There is no guarding.     Hernia: No hernia is present. There is no hernia in the left inguinal area or right inguinal area.  Genitourinary:    Pubic Area: No rash.      Penis: Normal and circumcised.      Testes: Normal.     Epididymis:     Right: Normal.     Left: Normal.     Prostate: Enlarged. Not tender and no nodules present.     Rectum: Normal. Guaiac result negative. No mass, tenderness, anal fissure, external hemorrhoid or internal hemorrhoid. Normal anal tone.  Musculoskeletal:        General: Normal range of motion.     Cervical back: Neck supple.     Right lower leg: No edema.     Left lower leg: No edema.  Lymphadenopathy:     Cervical: No cervical adenopathy.     Lower Body: No right inguinal adenopathy. No left inguinal adenopathy.  Neurological:     General: No focal deficit present.     Mental Status: He is alert.  Psychiatric:        Mood and Affect: Mood normal.        Behavior: Behavior normal.     Lab Results  Component Value Date   WBC 7.7 06/10/2023   HGB 13.7 06/10/2023   HCT 40.6 06/10/2023   PLT 250.0 06/10/2023   GLUCOSE 106 (H) 06/10/2023   CHOL 151 06/10/2023   TRIG 117.0 06/10/2023   HDL 57.10 06/10/2023   LDLDIRECT 88.0 08/07/2021   LDLCALC 71 06/10/2023   ALT 27 06/10/2023   AST 30 06/10/2023   NA 135  06/10/2023   K 3.7 06/10/2023   CL 99 06/10/2023   CREATININE 1.00 06/10/2023   BUN 15 06/10/2023   CO2 27 06/10/2023   TSH 3.71 06/10/2023   PSA 1.59 10/14/2022   INR 0.94 12/28/2011   HGBA1C 6.2 06/10/2023    CT ANGIO CHEST AORTA W/CM & OR WO/CM Result Date: 05/25/2023 CLINICAL DATA:  Thoracic aortic aneurysm EXAM: CT ANGIOGRAPHY CHEST WITH CONTRAST TECHNIQUE: Multidetector CT imaging of the chest was performed using the standard protocol during bolus administration of  intravenous contrast. Multiplanar CT image reconstructions and MIPs were obtained to evaluate the vascular anatomy. RADIATION DOSE REDUCTION: This exam was performed according to the departmental dose-optimization program which includes automated exposure control, adjustment of the mA and/or kV according to patient size and/or use of iterative reconstruction technique. CONTRAST:  75mL ISOVUE -370 IOPAMIDOL  (ISOVUE -370) INJECTION 76% COMPARISON:  Prior cardiac CT scan 04/12/2022 FINDINGS: Cardiovascular: The aortic root is within normal limits in size at 3.8 cm measured at the sinuses of Valsalva. Ectatic bordering on aneurysmal ascending thoracic aorta with a maximal diameter of 3.9 cm. Conventional 3 vessel arch anatomy. Trace atherosclerotic vascular calcifications along both the aorta and the coronary arteries. The heart is normal in size. No pericardial effusion. Mediastinum/Nodes: Unremarkable CT appearance of the thyroid  gland. No suspicious mediastinal or hilar adenopathy. No soft tissue mediastinal mass. The thoracic esophagus is unremarkable. Lungs/Pleura: Stable subpleural nodule along the left major fissure at 0.9 cm. This is almost certainly benign given no interval change over the past 1 year. No suspicious pulmonary mass or nodule. No focal airspace infiltrate, pleural effusion or pneumothorax. Upper Abdomen: No acute abnormality. Musculoskeletal: No chest wall abnormality. No acute or significant osseous findings. Review  of the MIP images confirms the above findings. IMPRESSION: 1. 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 2. Aortic and coronary artery atherosclerotic vascular calcifications. 3. Stable and almost certainly benign 9 mm subpleural pulmonary nodule along the left major fissure. Aortic Atherosclerosis (ICD10-I70.0). Electronically Signed   By: Wilkie Lent M.D.   On: 05/25/2023 11:18    Assessment & Plan:  Essential hypertension, benign -     Urinalysis, Routine w reflex microscopic; Future -     CBC with Differential/Platelet; Future -     Basic metabolic panel with GFR; Future -     COVID-19 mRNA Vac-TriS(Pf[employer]); Inject 0.3 mLs into the muscle once for 1 dose.  Dispense: 0.3 mL; Refill: 0  Prediabetes -     Hemoglobin A1c; Future -     Basic metabolic panel with GFR; Future  Acquired hypothyroidism -     TSH; Future  Benign prostatic hyperplasia without lower urinary tract symptoms -     PSA; Future -     Urinalysis, Routine w reflex microscopic; Future  Hyperlipidemia with target LDL less than 130 -     Lipid panel; Future -     TSH; Future -     Hepatic function panel; Future  Need for immunization against influenza -     Flu vaccine HIGH DOSE PF(Fluzone Trivalent)  Atypical nevus of back -     Ambulatory referral to Dermatology     Follow-up: Return in about 6 months (around 08/03/2024).  Debby Molt, MD "

## 2024-02-04 NOTE — Patient Instructions (Signed)

## 2024-02-05 ENCOUNTER — Ambulatory Visit: Payer: Self-pay | Admitting: Internal Medicine

## 2024-02-05 DIAGNOSIS — E871 Hypo-osmolality and hyponatremia: Secondary | ICD-10-CM | POA: Insufficient documentation

## 2024-02-05 MED ORDER — INDAPAMIDE 1.25 MG PO TABS: 1.2500 mg | ORAL_TABLET | Freq: Every day | ORAL | 1 refills | Status: AC

## 2024-02-05 MED ORDER — LOSARTAN POTASSIUM 100 MG PO TABS: 100.0000 mg | ORAL_TABLET | Freq: Every day | ORAL | 1 refills | Status: AC

## 2024-02-05 MED ORDER — ATORVASTATIN CALCIUM 20 MG PO TABS: 20.0000 mg | ORAL_TABLET | Freq: Every day | ORAL | 1 refills | Status: AC

## 2024-02-05 MED ORDER — LEVOTHYROXINE SODIUM 75 MCG PO TABS: 75.0000 ug | ORAL_TABLET | Freq: Every day | ORAL | 1 refills | Status: AC

## 2024-02-05 MED ORDER — METOPROLOL SUCCINATE ER 100 MG PO TB24: ORAL_TABLET | ORAL | 1 refills | Status: AC

## 2024-03-02 ENCOUNTER — Encounter (INDEPENDENT_AMBULATORY_CARE_PROVIDER_SITE_OTHER): Admitting: Ophthalmology

## 2024-03-16 ENCOUNTER — Ambulatory Visit: Admitting: Internal Medicine

## 2024-04-08 ENCOUNTER — Ambulatory Visit: Admitting: Dermatology
